# Patient Record
Sex: Male | Born: 1940 | Race: White | Hispanic: No | Marital: Married | Smoking: Former smoker
Health system: Southern US, Community
[De-identification: ages and names within clinical notes are randomized; demographics above are authoritative.]

## PROBLEM LIST (undated history)

## (undated) DIAGNOSIS — E785 Hyperlipidemia, unspecified: Secondary | ICD-10-CM

## (undated) DIAGNOSIS — C61 Malignant neoplasm of prostate: Secondary | ICD-10-CM

## (undated) DIAGNOSIS — M1990 Unspecified osteoarthritis, unspecified site: Secondary | ICD-10-CM

## (undated) DIAGNOSIS — C801 Malignant (primary) neoplasm, unspecified: Secondary | ICD-10-CM

## (undated) DIAGNOSIS — K449 Diaphragmatic hernia without obstruction or gangrene: Secondary | ICD-10-CM

## (undated) DIAGNOSIS — T7840XA Allergy, unspecified, initial encounter: Secondary | ICD-10-CM

## (undated) DIAGNOSIS — K5790 Diverticulosis of intestine, part unspecified, without perforation or abscess without bleeding: Secondary | ICD-10-CM

## (undated) DIAGNOSIS — K219 Gastro-esophageal reflux disease without esophagitis: Secondary | ICD-10-CM

## (undated) HISTORY — DX: Diverticulosis of intestine, part unspecified, without perforation or abscess without bleeding: K57.90

## (undated) HISTORY — PX: TRANSURETHRAL RESECTION OF PROSTATE: SHX73

## (undated) HISTORY — DX: Allergy, unspecified, initial encounter: T78.40XA

## (undated) HISTORY — DX: Gastro-esophageal reflux disease without esophagitis: K21.9

## (undated) HISTORY — DX: Malignant (primary) neoplasm, unspecified: C80.1

## (undated) HISTORY — PX: CATARACT EXTRACTION: SUR2

## (undated) HISTORY — DX: Hyperlipidemia, unspecified: E78.5

## (undated) HISTORY — PX: TONSILLECTOMY: SHX5217

## (undated) HISTORY — DX: Unspecified osteoarthritis, unspecified site: M19.90

## (undated) HISTORY — DX: Diaphragmatic hernia without obstruction or gangrene: K44.9

## (undated) HISTORY — PX: CHOLECYSTECTOMY: SHX55

## (undated) HISTORY — PX: BACK SURGERY: SHX140

## (undated) HISTORY — DX: Malignant neoplasm of prostate: C61

## (undated) HISTORY — PX: COLONOSCOPY: SHX174

## (undated) HISTORY — PX: UPPER GASTROINTESTINAL ENDOSCOPY: SHX188

---

## 1990-03-21 DIAGNOSIS — C61 Malignant neoplasm of prostate: Secondary | ICD-10-CM

## 1990-03-21 HISTORY — DX: Malignant neoplasm of prostate: C61

## 2005-05-18 ENCOUNTER — Encounter: Admission: RE | Admit: 2005-05-18 | Discharge: 2005-05-18 | Payer: Self-pay | Admitting: Family Medicine

## 2005-05-18 ENCOUNTER — Ambulatory Visit: Payer: Self-pay | Admitting: Family Medicine

## 2006-02-16 ENCOUNTER — Ambulatory Visit: Payer: Self-pay | Admitting: Family Medicine

## 2006-05-05 ENCOUNTER — Encounter: Payer: Self-pay | Admitting: Gastroenterology

## 2006-06-02 ENCOUNTER — Ambulatory Visit: Payer: Self-pay | Admitting: Family Medicine

## 2006-06-02 LAB — CONVERTED CEMR LAB
ALT: 24 units/L (ref 0–40)
AST: 22 units/L (ref 0–37)
Albumin: 4.1 g/dL (ref 3.5–5.2)
Alkaline Phosphatase: 68 units/L (ref 39–117)
BUN: 30 mg/dL — ABNORMAL HIGH (ref 6–23)
Basophils Absolute: 0 10*3/uL (ref 0.0–0.1)
Basophils Relative: 0.1 % (ref 0.0–1.0)
Bilirubin, Direct: 0.1 mg/dL (ref 0.0–0.3)
CO2: 29 meq/L (ref 19–32)
Calcium: 9 mg/dL (ref 8.4–10.5)
Chloride: 105 meq/L (ref 96–112)
Cholesterol: 224 mg/dL (ref 0–200)
Creatinine, Ser: 1.1 mg/dL (ref 0.4–1.5)
Direct LDL: 155.8 mg/dL
Eosinophils Absolute: 0.1 10*3/uL (ref 0.0–0.6)
Eosinophils Relative: 1.6 % (ref 0.0–5.0)
GFR calc Af Amer: 86 mL/min
GFR calc non Af Amer: 71 mL/min
Glucose, Bld: 88 mg/dL (ref 70–99)
HCT: 45.8 % (ref 39.0–52.0)
HDL: 46.8 mg/dL (ref >39.0)
Hemoglobin: 15.6 g/dL (ref 13.0–17.0)
Lymphocytes Relative: 26.9 % (ref 12.0–46.0)
MCHC: 34 g/dL (ref 30.0–36.0)
MCV: 92.1 fL (ref 78.0–100.0)
Monocytes Absolute: 0.4 10*3/uL (ref 0.2–0.7)
Monocytes Relative: 10.5 % (ref 3.0–11.0)
Neutro Abs: 2.5 10*3/uL (ref 1.4–7.7)
Neutrophils Relative %: 60.9 % (ref 43.0–77.0)
PSA: 0.03 ng/mL — ABNORMAL LOW (ref 0.10–4.00)
Platelets: 291 10*3/uL (ref 150–400)
Potassium: 4.3 meq/L (ref 3.5–5.1)
RBC: 4.97 M/uL (ref 4.22–5.81)
RDW: 13 % (ref 11.5–14.6)
Sodium: 144 meq/L (ref 135–145)
TSH: 5.68 microintl units/mL — ABNORMAL HIGH (ref 0.35–5.50)
Total Bilirubin: 1.2 mg/dL (ref 0.3–1.2)
Total CHOL/HDL Ratio: 4.8
Total Protein: 7.1 g/dL (ref 6.0–8.3)
Triglycerides: 89 mg/dL (ref 0–149)
VLDL: 18 mg/dL (ref 0–40)
WBC: 4.1 10*3/uL — ABNORMAL LOW (ref 4.5–10.5)

## 2006-06-22 ENCOUNTER — Ambulatory Visit: Payer: Self-pay | Admitting: Gastroenterology

## 2006-07-04 ENCOUNTER — Ambulatory Visit: Payer: Self-pay | Admitting: Gastroenterology

## 2006-11-24 DIAGNOSIS — Z8546 Personal history of malignant neoplasm of prostate: Secondary | ICD-10-CM

## 2007-04-19 ENCOUNTER — Telehealth: Payer: Self-pay | Admitting: Family Medicine

## 2007-08-20 ENCOUNTER — Encounter: Payer: Self-pay | Admitting: *Deleted

## 2007-08-22 ENCOUNTER — Telehealth: Payer: Self-pay | Admitting: Family Medicine

## 2007-10-02 ENCOUNTER — Telehealth: Payer: Self-pay | Admitting: *Deleted

## 2008-01-04 ENCOUNTER — Telehealth: Payer: Self-pay | Admitting: Family Medicine

## 2008-01-22 ENCOUNTER — Ambulatory Visit: Payer: Self-pay | Admitting: Family Medicine

## 2008-01-22 DIAGNOSIS — J309 Allergic rhinitis, unspecified: Secondary | ICD-10-CM

## 2008-01-22 LAB — CONVERTED CEMR LAB
ALT: 30 units/L (ref 0–53)
Basophils Relative: 0.2 % (ref 0.0–3.0)
Bilirubin Urine: NEGATIVE
Bilirubin, Direct: 0.1 mg/dL (ref 0.0–0.3)
Blood in Urine, dipstick: NEGATIVE
CO2: 28 meq/L (ref 19–32)
Calcium: 9.1 mg/dL (ref 8.4–10.5)
Creatinine, Ser: 1 mg/dL (ref 0.4–1.5)
Eosinophils Relative: 1.4 % (ref 0.0–5.0)
Glucose, Bld: 81 mg/dL (ref 70–99)
Glucose, Urine, Semiquant: NEGATIVE
Hemoglobin: 15.3 g/dL (ref 13.0–17.0)
Ketones, urine, test strip: NEGATIVE
Lymphocytes Relative: 24.3 % (ref 12.0–46.0)
Monocytes Relative: 6.1 % (ref 3.0–12.0)
Neutro Abs: 4.1 10*3/uL (ref 1.4–7.7)
Nitrite: NEGATIVE
PSA: 0.05 ng/mL — ABNORMAL LOW (ref 0.10–4.00)
Protein, U semiquant: NEGATIVE
RBC: 4.57 M/uL (ref 4.22–5.81)
Sodium: 141 meq/L (ref 135–145)
Specific Gravity, Urine: 1.02
Total CHOL/HDL Ratio: 5.4
Total Protein: 6.6 g/dL (ref 6.0–8.3)
Urobilinogen, UA: 0.2
VLDL: 64 mg/dL — ABNORMAL HIGH (ref 0–40)
WBC Urine, dipstick: NEGATIVE
pH: 5

## 2008-01-29 ENCOUNTER — Ambulatory Visit: Payer: Self-pay | Admitting: Family Medicine

## 2008-01-29 DIAGNOSIS — K219 Gastro-esophageal reflux disease without esophagitis: Secondary | ICD-10-CM | POA: Insufficient documentation

## 2008-01-29 DIAGNOSIS — E785 Hyperlipidemia, unspecified: Secondary | ICD-10-CM

## 2008-04-03 ENCOUNTER — Ambulatory Visit: Payer: Self-pay | Admitting: Family Medicine

## 2008-04-07 ENCOUNTER — Telehealth: Payer: Self-pay | Admitting: *Deleted

## 2009-04-01 ENCOUNTER — Ambulatory Visit: Payer: Self-pay | Admitting: Family Medicine

## 2009-04-01 LAB — CONVERTED CEMR LAB
ALT: 33 units/L (ref 0–53)
AST: 22 units/L (ref 0–37)
Alkaline Phosphatase: 75 units/L (ref 39–117)
Basophils Relative: 0.3 % (ref 0.0–3.0)
Bilirubin, Direct: 0 mg/dL (ref 0.0–0.3)
Calcium: 9.5 mg/dL (ref 8.4–10.5)
Chloride: 109 meq/L (ref 96–112)
Creatinine, Ser: 1.3 mg/dL (ref 0.4–1.5)
Eosinophils Relative: 4.5 % (ref 0.0–5.0)
GFR calc non Af Amer: 58.14 mL/min (ref >60)
LDL Cholesterol: 83 mg/dL (ref 0–99)
Lymphocytes Relative: 31.7 % (ref 12.0–46.0)
MCV: 95.9 fL (ref 78.0–100.0)
Monocytes Relative: 6.8 % (ref 3.0–12.0)
Neutrophils Relative %: 56.7 % (ref 43.0–77.0)
RBC: 4.68 M/uL (ref 4.22–5.81)
Total Bilirubin: 0.8 mg/dL (ref 0.3–1.2)
Total CHOL/HDL Ratio: 4
Triglycerides: 171 mg/dL — ABNORMAL HIGH (ref 0.0–149.0)
WBC: 5.4 10*3/uL (ref 4.5–10.5)

## 2009-04-02 ENCOUNTER — Encounter (INDEPENDENT_AMBULATORY_CARE_PROVIDER_SITE_OTHER): Payer: Self-pay | Admitting: *Deleted

## 2009-04-30 ENCOUNTER — Ambulatory Visit: Payer: Self-pay | Admitting: Gastroenterology

## 2009-04-30 DIAGNOSIS — R1310 Dysphagia, unspecified: Secondary | ICD-10-CM | POA: Insufficient documentation

## 2009-05-01 ENCOUNTER — Ambulatory Visit: Payer: Self-pay | Admitting: Gastroenterology

## 2009-05-06 ENCOUNTER — Encounter: Payer: Self-pay | Admitting: Gastroenterology

## 2009-05-13 ENCOUNTER — Ambulatory Visit: Payer: Self-pay | Admitting: Family Medicine

## 2009-05-28 ENCOUNTER — Ambulatory Visit: Payer: Self-pay | Admitting: Gastroenterology

## 2009-07-28 ENCOUNTER — Telehealth: Payer: Self-pay | Admitting: Family Medicine

## 2009-08-24 ENCOUNTER — Telehealth (INDEPENDENT_AMBULATORY_CARE_PROVIDER_SITE_OTHER): Payer: Self-pay | Admitting: *Deleted

## 2010-01-18 ENCOUNTER — Telehealth: Payer: Self-pay | Admitting: Family Medicine

## 2010-01-25 ENCOUNTER — Telehealth: Payer: Self-pay | Admitting: Family Medicine

## 2010-03-30 ENCOUNTER — Ambulatory Visit
Admission: RE | Admit: 2010-03-30 | Discharge: 2010-03-30 | Payer: Self-pay | Source: Home / Self Care | Attending: Family Medicine | Admitting: Family Medicine

## 2010-03-30 ENCOUNTER — Other Ambulatory Visit: Payer: Self-pay | Admitting: Family Medicine

## 2010-03-30 DIAGNOSIS — F528 Other sexual dysfunction not due to a substance or known physiological condition: Secondary | ICD-10-CM | POA: Insufficient documentation

## 2010-03-30 LAB — CBC WITH DIFFERENTIAL/PLATELET
Basophils Absolute: 0 10*3/uL (ref 0.0–0.1)
Basophils Relative: 0.5 % (ref 0.0–3.0)
Eosinophils Absolute: 0.1 10*3/uL (ref 0.0–0.7)
Eosinophils Relative: 1.7 % (ref 0.0–5.0)
HCT: 43.9 % (ref 39.0–52.0)
Hemoglobin: 15.3 g/dL (ref 13.0–17.0)
Lymphocytes Relative: 25.7 % (ref 12.0–46.0)
Lymphs Abs: 1.4 10*3/uL (ref 0.7–4.0)
MCHC: 34.9 g/dL (ref 30.0–36.0)
MCV: 95.2 fl (ref 78.0–100.0)
Monocytes Absolute: 0.4 10*3/uL (ref 0.1–1.0)
Monocytes Relative: 7.3 % (ref 3.0–12.0)
Neutro Abs: 3.5 10*3/uL (ref 1.4–7.7)
Neutrophils Relative %: 64.8 % (ref 43.0–77.0)
Platelets: 278 10*3/uL (ref 150.0–400.0)
RBC: 4.61 Mil/uL (ref 4.22–5.81)
RDW: 13.8 % (ref 11.5–14.6)
WBC: 5.4 10*3/uL (ref 4.5–10.5)

## 2010-03-30 LAB — HEPATIC FUNCTION PANEL
ALT: 37 U/L (ref 0–53)
AST: 23 U/L (ref 0–37)
Albumin: 4.3 g/dL (ref 3.5–5.2)
Alkaline Phosphatase: 76 U/L (ref 39–117)
Bilirubin, Direct: 0.1 mg/dL (ref 0.0–0.3)
Total Bilirubin: 0.7 mg/dL (ref 0.3–1.2)
Total Protein: 6.6 g/dL (ref 6.0–8.3)

## 2010-03-30 LAB — LIPID PANEL
Cholesterol: 180 mg/dL (ref 0–200)
HDL: 38.2 mg/dL — ABNORMAL LOW (ref >39.00)
Total CHOL/HDL Ratio: 5
Triglycerides: 392 mg/dL — ABNORMAL HIGH (ref 0.0–149.0)
VLDL: 78.4 mg/dL — ABNORMAL HIGH (ref 0.0–40.0)

## 2010-03-30 LAB — BASIC METABOLIC PANEL
BUN: 19 mg/dL (ref 6–23)
CO2: 28 mEq/L (ref 19–32)
Calcium: 9.4 mg/dL (ref 8.4–10.5)
Chloride: 106 mEq/L (ref 96–112)
Creatinine, Ser: 1 mg/dL (ref 0.4–1.5)
GFR: 75.84 mL/min (ref >60.00)
Glucose, Bld: 107 mg/dL — ABNORMAL HIGH (ref 70–99)
Potassium: 4.9 mEq/L (ref 3.5–5.1)
Sodium: 143 mEq/L (ref 135–145)

## 2010-03-30 LAB — PSA: PSA: 0.03 ng/mL — ABNORMAL LOW (ref 0.10–4.00)

## 2010-03-30 LAB — TSH: TSH: 5.61 u[IU]/mL — ABNORMAL HIGH (ref 0.35–5.50)

## 2010-03-30 LAB — LDL CHOLESTEROL, DIRECT: Direct LDL: 104.4 mg/dL

## 2010-04-13 ENCOUNTER — Telehealth: Payer: Self-pay | Admitting: Family Medicine

## 2010-04-22 NOTE — Assessment & Plan Note (Signed)
Summary: COMPLETE AND SIGN PRESCRIPTION FORM/CJR   Vital Signs:  Patient profile:   70 year old male BP sitting:   160 / 82  (left arm)  Vitals Entered By: Kern Reap CMA Duncan Dull) (March 30, 2010 9:29 AM)  Primary Care Provider:  Gaetano Net Ambri Miltner,MD   History of Present Illness: Travis Schwartz is a 70 year old male, nonsmoker, who comes in today for evaluation of possible hypertension and erectile dysfunction.  His blood pressure last year was 124/84, now it's 160/92, by Fleet Contras, 150 over hundred, by me right arm sitting position.  In about 1991 he had a total prostatectomy for prostate cancer by Dr. Wanda Plump.  He would like to discuss treatment options, including the vacuum device.  Referred to Dr. Brett Canales D. for further valuation.  Is also due for his annual physical examination  Allergies: No Known Drug Allergies  Past History:  Past medical, surgical, family and social histories (including risk factors) reviewed for relevance to current acute and chronic problems.  Past Medical History: Reviewed history from 04/28/2009 and no changes required. Arthritis High Cholesterol Prostate cancer, hx of Allergic rhinitis GERD Hyperlipidemia Diverticulosis  2008  Past Surgical History: Reviewed history from 04/30/2009 and no changes required. Transurethral resection of prostate Colonoscopy Tonsillectomy  Family History: Reviewed history from 04/30/2009 and no changes required. Family History High cholesterol Family History Uterine cancer: sister No FH of Colon Cancer:  Social History: Reviewed history from 04/30/2009 and no changes required. Retired Married Former Smoker quit 64 Alcohol use-no Illicit Drug Use - no Daily Caffeine Use coffee in the morning  Review of Systems      See HPI  Physical Exam  General:  Well-developed,well-nourished,in no acute distress; alert,appropriate and cooperative throughout examination   Problems:  Medical Problems Added: 1)   Dx of Elevated Blood Pressure Without Diagnosis of Hypertension  (ICD-796.2) 2)  Dx of Erectile Dysfunction  (ICD-302.72)  Impression & Recommendations:  Problem # 1:  ELEVATED BLOOD PRESSURE WITHOUT DIAGNOSIS OF HYPERTENSION (ICD-796.2) Assessment New  Orders: Venipuncture ([address]) TLB-Lipid Panel (80061-LIPID) TLB-BMP (Basic Metabolic Panel-BMET) (80048-METABOL) TLB-CBC Platelet - w/Differential (85025-CBCD) TLB-Hepatic/Liver Function Pnl (80076-HEPATIC) TLB-TSH (Thyroid Stimulating Hormone) (84443-TSH) TLB-PSA (Prostate Specific Antigen) (84153-PSA) Urinalysis-dipstick only (Medicare patient) (60454UJ) Specimen Handling (81191)  Problem # 2:  ERECTILE DYSFUNCTION (ICD-302.72) Assessment: Deteriorated  Orders: Venipuncture ([address]) TLB-Lipid Panel (80061-LIPID) TLB-BMP (Basic Metabolic Panel-BMET) (80048-METABOL) TLB-CBC Platelet - w/Differential (85025-CBCD) TLB-Hepatic/Liver Function Pnl (80076-HEPATIC) TLB-TSH (Thyroid Stimulating Hormone) (84443-TSH) TLB-PSA (Prostate Specific Antigen) (84153-PSA) Urinalysis-dipstick only (Medicare patient) (56213YQ)  Complete Medication List: 1)  Miralax Powd (Polyethylene glycol 3350) .... As needed 2)  Voltaren 75 Mg Tbec (Diclofenac sodium) .... Take 1 tablet by mouth twice a day 3)  Zocor 80 Mg Tabs (Simvastatin) .... Once daily 4)  Adult Aspirin Low Strength 81 Mg Tbdp (Aspirin) .... Once daily 5)  Omeprazole 40 Mg Cpdr (Omeprazole) .... Take one tab once daily 6)  Trazodone Hcl 50 Mg Tabs (Trazodone hcl) .... Take one tab by mouth at bedtime  Patient Instructions: 1)  setup and 30 minute appointment in two weeks for a general medical exam. 2)  Check your blood pressure daily in the morning and at bedtime with a digital pump up blood pressure cuff....OMRON.......  Seems to be the most accurate device 3)  Call the urology Center and make an appointment to see Dr. Viviann Spare D.alsredt for urologic evaluation   Orders Added: 1)   Venipuncture [36415] 2)  TLB-Lipid Panel [80061-LIPID] 3)  TLB-BMP (Basic Metabolic Panel-BMET) [80048-METABOL] 4)  TLB-CBC Platelet - w/Differential [85025-CBCD] 5)  TLB-Hepatic/Liver Function Pnl [80076-HEPATIC] 6)  TLB-TSH (Thyroid Stimulating Hormone) [84443-TSH] 7)  TLB-PSA (Prostate Specific Antigen) [84153-PSA] 8)  Est. Patient Level III [[address]] 9)  Urinalysis-dipstick only (Medicare patient) [81003QW] 10)  Specimen Handling [99000]  Appended Document: COMPLETE AND SIGN PRESCRIPTION FORM/CJR  Laboratory Results   Urine Tests    Routine Urinalysis   Color: yellow Appearance: Clear Glucose: negative   (Normal Range: Negative) Bilirubin: negative   (Normal Range: Negative) Ketone: negative   (Normal Range: Negative) Spec. Gravity: 1.020   (Normal Range: 1.003-1.035) Blood: negative   (Normal Range: Negative) pH: 5.0   (Normal Range: 5.0-8.0) Protein: negative   (Normal Range: Negative) Urobilinogen: 0.2   (Normal Range: 0-1) Nitrite: negative   (Normal Range: Negative) Leukocyte Esterace: negative   (Normal Range: Negative)    Comments: Rita Ohara  March 30, 2010 4:21 PM

## 2010-04-22 NOTE — Progress Notes (Signed)
Summary: Refill  Phone Note Refill Request   Refills Requested: Medication #1:  OMEPRAZOLE 40 MG CPDR take one tab once daily   Dosage confirmed as above?Dosage Confirmed Initial call taken by: Kathrynn Speed CMA,  August 24, 2009 12:58 PM Caller: [facility] [address] [phone]* Call For: Aleatha Borer  Initial call taken by: Kathrynn Speed CMA,  August 24, 2009 12:58 PM    Prescriptions: OMEPRAZOLE 40 MG CPDR (OMEPRAZOLE) take one tab once daily  #100 x 3   Entered by:   Kathrynn Speed CMA   Authorized by:   Roderick Pee MD   Signed by:   Kathrynn Speed CMA on 08/24/2009   Method used:   Electronically to        [facility] [address] (retail)       [address]       [place], [place]  [address]       Ph: [phone] or [phone]       Fax: [phone]   RxID:   [id number]

## 2010-04-22 NOTE — Letter (Signed)
Summary: Results Letter  [facility]  [address] [place], [place] [address]   Phone: [phone]  Fax: [phone]        April 30, 2009 MRN: [id number]    Travis Schwartz [address] [place], [place]  [address]    Dear Mr. Frees,  It is my pleasure to have treated you recently as a new patient in my office. I appreciate your confidence and the opportunity to participate in your care.  Since I do have a busy inpatient endoscopy schedule and office schedule, my office hours vary weekly. I am, however, available for emergency calls everyday through my office. If I am not available for an urgent office appointment, another one of our gastroenterologist will be able to assist you.  My well-trained staff are prepared to help you at all times. For emergencies after office hours, a physician from our Gastroenterology section is always available through my 24 hour answering service  Once again I welcome you as a new patient and I look forward to a happy and healthy relationship             Sincerely,  Louis Meckel MD  This letter has been electronically signed by your physician.  Appended Document: Results Letter letter mailed

## 2010-04-22 NOTE — Procedures (Signed)
Summary: COLONOSCOPY   Colonoscopy  Procedure date:  07/04/2006  Findings:      Results: Diverticulosis.       Location:  [facility].    Comments:      Repeat colonoscopy in 5 years.   Colonoscopy  Procedure date:  07/04/2006  Findings:      Results: Diverticulosis.       Location:  [facility].    Comments:      Repeat colonoscopy in 5 years.  Patient Name: Travis Schwartz, Travis Schwartz MRN:  Procedure Procedures: Colonoscopy CPT: [phone].  Personnel: Endoscopist: Barbette Hair. Arlyce Dice, MD.  Referred By: Eugenio Hoes Tawanna Cooler, MD.  Patient Consent: Procedure, Alternatives, Risks and Benefits discussed, consent obtained, from patient.  Indications  Increased Risk Screening: For family history of colorectal neoplasia, in  parent age at onset: 3.  History  Current Medications: Patient is not currently taking Coumadin.  Pre-Exam Physical: Performed Jul 04, 2006. Cardio-pulmonary exam, HEENT exam , Abdominal exam, Mental status exam WNL.  Comments: Patient history reviewed/updated, physical performed prior to initiation of sedation?yes Exam Exam: Extent of exam reached: Cecum, extent intended: Cecum.  The cecum was identified by appendiceal orifice and IC valve. Time to Cecum: 00:03: 57. Time for Withdrawl: 00:05:35. Colon retroflexion performed. ASA Classification: I. Tolerance: good.  Monitoring: Pulse and BP monitoring, Oximetry used. Supplemental O2 given. at 2 Liters.  Colon Prep Used Miralax for colon prep. Prep results: good.  Sedation Meds: Patient assessed and found to be appropriate for moderate (conscious) sedation. Sedation was managed by the Endoscopist. Fentanyl 100 mcg. given IV. Versed 8 mg. given IV.  Findings - NORMAL EXAM: Cecum to Descending Colon.  NORMAL EXAM: Cecum.  - DIVERTICULOSIS: Sigmoid Colon. ICD9: Diverticulosis, Colon: 562.10. Comments: Few diverticula.  - NORMAL EXAM: Sigmoid Colon to Rectum.    Assessment Abnormal examination, see findings above.  Diagnoses: 562.10: Diverticulosis, Colon.   Events  Unplanned Interventions: No intervention was required.  Unplanned Events: There were no complications. Plans  Post Exam Instructions: Post sedation instructions given.  Patient Education: Patient given standard instructions for: Diverticulosis.  Disposition: After procedure patient sent to recovery. After recovery patient sent home.  Scheduling/Referral: Colonoscopy, to Barbette Hair. Arlyce Dice, MD, around Jul 04, 2011.    This report was created from the original endoscopy report, which was reviewed and signed by the above listed endoscopist.

## 2010-04-22 NOTE — Assessment & Plan Note (Signed)
Summary: DIFF W FOOD GETTING STUCK/MEDICARE AND SUPP/SHELL    History of Present Illness Visit Type: Follow-up Consult Primary GI MD: Melvia Heaps MD [facility] Primary Provider: Gaetano Net Todd,MD Requesting Provider: Gaetano Net Todd,MD Chief Complaint: Patient states that he is waking up in the middle of the night with severe epigastric pain and burning. Since he has increased his Omperazole to twice a day he has not has a problem with the burning at night.  He denies any dysphagia.  History of Present Illness:   [name] is a pleasant 70 year old white male referred at the request of Dr. Tawanna Cooler for evaluation of chest burning and upper abdominal discomfort.  He often awakens because of pyrosis.  He may have upper abdominal discomfort as well.  Since taking Prilosec twice a day symptoms have improved though they remain.  He has dysphagia to solids.  He is taking voltarem  daily.  The patient has history of diverticulosis as determined by colonoscopy in 2008.  Has a history of prostate cancer.   GI Review of Systems    Reports abdominal pain and  chest pain.     Location of  Abdominal pain: epigastric area.    Denies acid reflux, belching, bloating, dysphagia with liquids, dysphagia with solids, heartburn, loss of appetite, nausea, vomiting, vomiting blood, weight loss, and  weight gain.        Denies anal fissure, black tarry stools, change in bowel habit, constipation, diarrhea, diverticulosis, fecal incontinence, heme positive stool, hemorrhoids, irritable bowel syndrome, jaundice, light color stool, liver problems, rectal bleeding, and  rectal pain. Preventive Screening-Counseling & Management      Drug Use:  no.      Current Medications (verified): 1)  Miralax  Powd (Polyethylene Glycol 3350) .... As Needed 2)  Voltaren 75 Mg Tbec (Diclofenac Sodium) .... Take 1 Tablet By Mouth Twice A Day 3)  Zocor 80 Mg Tabs (Simvastatin) .... Once Daily 4)  Adult Aspirin Low Strength 81 Mg   Tbdp (Aspirin) .... Once Daily 5)  Omeprazole 40 Mg Cpdr (Omeprazole) .... Take One By Mouth Two Times A Day  Allergies (verified): No Known Drug Allergies  Past History:  Past Medical History: Last updated: 04/28/2009 Arthritis High Cholesterol Prostate cancer, hx of Allergic rhinitis GERD Hyperlipidemia Diverticulosis  2008  Past Surgical History: Transurethral resection of prostate Colonoscopy Tonsillectomy  Family History: Family History High cholesterol Family History Uterine cancer: sister No FH of Colon Cancer:  Social History: Retired Married Former Smoker quit 64 Alcohol use-no Illicit Drug Use - no Daily Caffeine Use coffee in the morning Drug Use:  no  Review of Systems       The patient complains of back pain.  The patient denies allergy/sinus, anemia, anxiety-new, arthritis/joint pain, blood in urine, breast changes/lumps, change in vision, confusion, cough, coughing up blood, depression-new, fainting, fatigue, fever, headaches-new, hearing problems, heart murmur, heart rhythm changes, itching, menstrual pain, muscle pains/cramps, night sweats, nosebleeds, pregnancy symptoms, shortness of breath, skin rash, sleeping problems, sore throat, swelling of feet/legs, swollen lymph glands, thirst - excessive , urination - excessive , urination changes/pain, urine leakage, vision changes, and voice change.         All other systems were reviewed and were negative   Vital Signs:  Patient profile:   70 year old male Height:      69 inches Weight:      221 pounds BMI:     32.75 Pulse rate:   84 / minute Pulse rhythm:  regular BP sitting:   130 / 76  (left arm) Cuff size:   regular  Vitals Entered By: Harlow Mares CMA (AAMA) (April 30, 2009 10:14 AM)  Physical Exam  Additional Exam:  Is a healthy-appearing male  skin: anicteric HEENT: normocephalic; PEERLA; no nasal or pharyngeal abnormalities neck: supple nodes: no cervical  lymphadenopathy chest: clear to ausculatation and percussion heart: no murmurs, gallops, or rubs abd: soft, nontender; BS normoactive; no abdominal masses, tenderness, organomegaly rectal: deferred ext: no cynanosis, clubbing, edema skeletal: no deformities neuro: oriented x 3; no focal abnormalities    Impression & Recommendations:  Problem # 1:  GERD (ICD-530.81) The patient has nocturnal GERD.  Symptoms may be exacerbated by his NSAID use  Recommendations #1 trial of Zegerid 40 mg q.h.s. #2 upper endoscopy #3 patient may have to hold his NSAIDs if symptoms continue  Problem # 2:  DYSPHAGIA UNSPECIFIED (ICD-787.20)  rule out early esophageal stricture  Recommendations #1 upper endoscopy with dilatation as indicated  Risks, alternatives, and complications of the procedure, including bleeding, perforation, and possible need for surgery, were explained to the patient.  Patient's questions were answered.  Orders: EGD (EGD)  Problem # 3:  PROSTATE CANCER, HX OF (ICD-V10.46) Assessment: Comment Only  Patient Instructions: 1)  Conscious Sedation brochure given.  2)  Upper Endoscopy with Dilatation brochure given.  3)  Your EGD is scheduled for 05/01/2009 at 2:30pm 4)  You can pick up your new rx from your pharmacy today 5)  The medication list was reviewed and reconciled.  All changed / newly prescribed medications were explained.  A complete medication list was provided to the patient / caregiver. Prescriptions: ZEGERID 40-1100 MG CAPS (OMEPRAZOLE-SODIUM BICARBONATE) take one tab q.h.s.  #30 x 2   Entered and Authorized by:   Louis Meckel MD   Signed by:   Louis Meckel MD on 04/30/2009   Method used:   Electronically to        [facility] (retail)       [address]       [place], [place]  [address]       Ph: [phone] or [phone]       Fax: [phone]   RxID:   [id number]

## 2010-04-22 NOTE — Letter (Signed)
Summary: [facility] Referral (unable to schedule) Notification  [facility]  [address] [place], [place] [address]   Phone: [phone]  Fax: [phone]      April 02, 2009 Travis Schwartz 03-26-1940 MRN: [id number]   [name] [address] [place], [place]  [address]   Dear Dr. Tawanna Cooler:   Thank you for your kind referral of the above patient. We have attempted to schedule the recommended GI Consult but have been unable to schedule because:  __ The patient was not available by phone and/or has not returned our calls.  _x_ The patient declined to schedule the procedure at this time.  We appreciate the referral and hope that we will have the opportunity to treat this patient in the future.    Sincerely,   [facility]  Vania Rea. Jarold Motto M.D. Hedwig Morton. Juanda Chance M.D. Venita Lick. Russella Dar M.D. Wilhemina Bonito. Marina Goodell M.D. Barbette Hair. Arlyce Dice M.D. Iva Boop M.D. Cheron Every.D.

## 2010-04-22 NOTE — Assessment & Plan Note (Signed)
Summary: MED CK (REFILLS) // RS   Vital Signs:  Patient profile:   70 year old male Weight:      221 pounds Temp:     98.8 degrees F Pulse rate:   82 / minute Resp:     12 per minute BP sitting:   118 / 74  (left arm)  Vitals Entered By: Gladis Riffle, RN (April 01, 2009 10:16 AM)   History of Present Illness: Travis Schwartz is a 70 year old, married male, nonsmoker, who comes in after  a year's absence for evaluation of dysphagia.  He set a long-standing history of reflux esophagitis.  He takes omeprazole 40 mg q.a.m. and tries not to eat before he lies down.  Over the past month or two.  He said his food getting stuck in his midesophagus.  He said no chest pain.  Is also due for his annual exam.  He stopped the Synthroid 3 months ago because it didn't feel like he was doing him any good.  Preventive Screening-Counseling & Management  Alcohol-Tobacco     Smoking Status: quit     Year Started: 1962     Year Quit: 1965  Allergies (verified): No Known Drug Allergies  Comments:  Nurse/Medical Assistant: med check, needs refills, fasting--states does not take synthroid and rarely uses darvocet  The patient's medications and allergies were reviewed with the patient and were updated in the Medication and Allergy Lists. Gladis Riffle, RN (April 01, 2009 10:20 AM)  Past History:  Past medical, surgical, family and social histories (including risk factors) reviewed for relevance to current acute and chronic problems.  Past Medical History: Reviewed history from 01/29/2008 and no changes required. Arthritis High Cholesterol Prostate cancer, hx of Allergic rhinitis GERD Hyperlipidemia  Past Surgical History: Reviewed history from 11/24/2006 and no changes required. Transurethral resection of prostate Colonoscopy  Family History: Reviewed history from 11/24/2006 and no changes required. Family History High cholesterol Family History Uterine cancer  Social History: Reviewed  history from 11/24/2006 and no changes required. Retired Married Former Smoker Alcohol use-no  Review of Systems      See HPI  Physical Exam  General:  Well-developed,well-nourished,in no acute distress; alert,appropriate and cooperative throughout examination   Impression & Recommendations:  Problem # 1:  GERD (ICD-530.81) Assessment Deteriorated  His updated medication list for this problem includes:    Omeprazole 40 Mg Cpdr (Omeprazole) .Marland Kitchen... Take 1 tablet by mouth every morning  Orders: Gastroenterology Referral (GI)  Complete Medication List: 1)  Miralax Powd (Polyethylene glycol 3350) .... As needed 2)  Voltaren 75 Mg Tbec (Diclofenac sodium) .... Take 1 tablet by mouth twice a day 3)  Zocor 80 Mg Tabs (Simvastatin) .... Once daily 4)  Adult Aspirin Low Strength 81 Mg Tbdp (Aspirin) .... Once daily 5)  Omeprazole 40 Mg Cpdr (Omeprazole) .... Take 1 tablet by mouth every morning 6)  Darvocet A500 100-500 Mg Tabs (Propoxyphene n-apap) .Marland Kitchen.. 1 or 2 at bedtime as needed pain 7)  Synthroid 50 Mcg Tabs (Levothyroxine sodium) .... Take 1 tablet by mouth every morning  Other Orders: Venipuncture ([address]) TLB-Lipid Panel (80061-LIPID) TLB-BMP (Basic Metabolic Panel-BMET) (80048-METABOL) TLB-CBC Platelet - w/Differential (85025-CBCD) TLB-TSH (Thyroid Stimulating Hormone) (84443-TSH) TLB-Hepatic/Liver Function Pnl (80076-HEPATIC) TLB-PSA (Prostate Specific Antigen) (84153-PSA)  Patient Instructions: 1)  we will put in a request for you to see your gastroenterologist, ASAP. 2)  In the meantime, nothing to eat or drink for 3 hours prior to bedtime elevate her head with two pillows and double  the Prilosec.  Also avoid eating, anything it's difficult to swallow, like steak and hot dogs.  When you do eat chew slowly and drink lots of water. 3)  Set up a 30 minute appointment sometime in the next 6 weeks for your annual physical exam Prescriptions: OMEPRAZOLE 40 MG CPDR  (OMEPRAZOLE) Take 1 tablet by mouth every morning  #100 Capsule x 3   Entered and Authorized by:   Roderick Pee MD   Signed by:   Roderick Pee MD on 04/01/2009   Method used:   Print then Give to Patient   RxID:   [id number] ZOCOR 80 MG TABS (SIMVASTATIN) once daily  #100 Tablet x 3   Entered and Authorized by:   Roderick Pee MD   Signed by:   Roderick Pee MD on 04/01/2009   Method used:   Print then Give to Patient   RxID:   [id number] VOLTAREN 75 MG TBEC (DICLOFENAC SODIUM) Take 1 tablet by mouth twice a day  #200 x 3   Entered and Authorized by:   Roderick Pee MD   Signed by:   Roderick Pee MD on 04/01/2009   Method used:   Print then Give to Patient   RxID:   [id number]   Appended Document: MED CK (REFILLS) // RS  Laboratory Results   Urine Tests    Routine Urinalysis   Color: yellow Appearance: Clear Glucose: negative   (Normal Range: Negative) Bilirubin: 1+   (Normal Range: Negative) Ketone: 1+   (Normal Range: Negative) Spec. Gravity: >=1.030   (Normal Range: 1.003-1.035) Blood: negative   (Normal Range: Negative) pH: 5.5   (Normal Range: 5.0-8.0) Protein: 1+   (Normal Range: Negative) Urobilinogen: 0.2   (Normal Range: 0-1) Nitrite: negative   (Normal Range: Negative) Leukocyte Esterace: negative   (Normal Range: Negative)    Comments: Rita Ohara  May 13, 2009 9:47 AM

## 2010-04-22 NOTE — Progress Notes (Signed)
Summary: trazadone refill  Phone Note From Pharmacy   Summary of Call: patient is requesting a refill of trazadone is this okay to fill? Initial call taken by: Kern Reap CMA Duncan Dull),  Jul 28, 2009 9:28 AM  Follow-up for Phone Call         okay for one year Follow-up by: Roderick Pee MD,  Jul 28, 2009 10:04 AM  Additional Follow-up for Phone Call Additional follow up Details #1::        Phone call completed Additional Follow-up by: Kern Reap CMA Duncan Dull),  Jul 28, 2009 10:25 AM

## 2010-04-22 NOTE — Letter (Signed)
Summary: Results Letter  [facility]  [address] [place], [place] [address]   Phone: [phone]  Fax: [phone]        May 06, 2009 MRN: [id number]    Travis Schwartz [address] [place], [place]  [address]    Dear Mr. Bufford,   Your biopsies demonstrated inflammatory changes only.    Please follow the recommendations previously discussed.  Should you have any immediate concerns or questions, feel free to contact me at the office.    Sincerely,  Barbette Hair. Arlyce Dice, M.D., [facility]          Sincerely,  Louis Meckel MD  This letter has been electronically signed by your physician.  Appended Document: Results Letter Letter mailed 2.17.11

## 2010-04-22 NOTE — Progress Notes (Signed)
Summary: refill request  Phone Note Refill Request Message from:  Fax from Pharmacy on April 13, 2010 2:09 PM  Refills Requested: Medication #1:  ZOCOR 80 MG TABS once daily Initial call taken by: Kern Reap CMA Duncan Dull),  April 13, 2010 2:09 PM    Prescriptions: ZOCOR 80 MG TABS (SIMVASTATIN) once daily  #100 Tablet x 3   Entered by:   Kern Reap CMA (AAMA)   Authorized by:   Roderick Pee MD   Signed by:   Kern Reap CMA (AAMA) on 04/13/2010   Method used:   Electronically to        [facility] [address] (retail)       [address]       [place], [place]  [address]       Ph: [phone] or [phone]       Fax: [phone]   RxID:   [id number]

## 2010-04-22 NOTE — Letter (Signed)
Summary: EGD Instructions  [facility]  [address] [place], [place] [address]   Phone: [phone]  Fax: [phone]       Travis Schwartz    02/24/1941    MRN: [id number]       Procedure Day /Date:FRIDAY 05/01/2009     Arrival Time: 1:30PM     Procedure Time:2:30PM     Location of Procedure:                    X  [facility] (4th Floor)   PREPARATION FOR ENDOSCOPY/DIL   On 05/01/2009 THE DAY OF THE PROCEDURE:  1.   No solid foods, milk or milk products are allowed after midnight the night before your procedure.  2.   Do not drink anything colored red or purple.  Avoid juices with pulp.  No orange juice.  3.  You may drink clear liquids until 12:30PM  which is 2 hours before your procedure.                                                                                                CLEAR LIQUIDS INCLUDE: Water Jello Ice Popsicles Tea (sugar ok, no milk/cream) Powdered fruit flavored drinks Coffee (sugar ok, no milk/cream) Gatorade Juice: apple, white grape, white cranberry  Lemonade Clear bullion, consomm, broth Carbonated beverages (any kind) Strained chicken noodle soup Hard Candy   MEDICATION INSTRUCTIONS  Unless otherwise instructed, you should take regular prescription medications with a small sip of water as early as possible the morning of your procedure.          OTHER INSTRUCTIONS  You will need a responsible adult at least 71 years of age to accompany you and drive you home.   This person must remain in the waiting room during your procedure.  Wear loose fitting clothing that is easily removed.  Leave jewelry and other valuables at home.  However, you may wish to bring a book to read or an iPod/MP3 player to listen to music as you wait for your procedure to start.  Remove all body piercing jewelry and leave at home.  Total time from sign-in until discharge is approximately 2-3 hours.  You should go home directly  after your procedure and rest.  You can resume normal activities the day after your procedure.  The day of your procedure you should not:   Drive   Make legal decisions   Operate machinery   Drink alcohol   Return to work  You will receive specific instructions about eating, activities and medications before you leave.    The above instructions have been reviewed and explained to me by   _______________________    I fully understand and can verbalize these instructions _____________________________ Date _________

## 2010-04-22 NOTE — Assessment & Plan Note (Signed)
Summary: 30 MIN OV/NJR   Vital Signs:  Patient profile:   70 year old male Height:      69 inches Weight:      220 pounds Temp:     99.1 degrees F oral BP sitting:   124 / 84  (left arm) Cuff size:   regular  Vitals Entered By: Kern Reap CMA Duncan Dull) (May 13, 2009 8:41 AM)  Reason for Visit cpx  Primary Care Provider:  Gaetano Net Todd,MD   History of Present Illness: Travis Schwartz is a 70 year old, married male, nonsmoker, who comes in today for multiple issues.  Is a history of osteoarthritis for which he takes Voltaren 75 mg b.i.d.  He has a history of underlying hyperlipidemia, for which he takes Zocor 80 mg nightly will check lipid panel today.  He has a history of dysphasia.  GI evaluation by Dr. Arlyce Dice, negative.  He was placed on zergrid...Marland KitchenMarland KitchenMarland Kitchen but would like to go back on generic Prilosec because of cost issues.  I asked him to discuss this with his gastroenterologist.  He gets routine eye care to care.  Colonoscopy done in GI.  Prostatectomy years ago for prostate cancer, asymptomatic he's been released by the urologist.  Tetanus 2007, seasonal flu 2010, Pneumovax 2007, shingles 2009.  Retired in December 2011  Allergies: No Known Drug Allergies  Past History:  Past medical, surgical, family and social histories (including risk factors) reviewed, and no changes noted (except as noted below).  Past Medical History: Reviewed history from 04/28/2009 and no changes required. Arthritis High Cholesterol Prostate cancer, hx of Allergic rhinitis GERD Hyperlipidemia Diverticulosis  2008  Past Surgical History: Reviewed history from 04/30/2009 and no changes required. Transurethral resection of prostate Colonoscopy Tonsillectomy  Family History: Reviewed history from 04/30/2009 and no changes required. Family History High cholesterol Family History Uterine cancer: sister No FH of Colon Cancer:  Social History: Reviewed history from 04/30/2009 and no  changes required. Retired Married Former Smoker quit 64 Alcohol use-no Illicit Drug Use - no Daily Caffeine Use coffee in the morning  Review of Systems      See HPI  Physical Exam  General:  Well-developed,well-nourished,in no acute distress; alert,appropriate and cooperative throughout examination Head:  Normocephalic and atraumatic without obvious abnormalities. No apparent alopecia or balding. Eyes:  No corneal or conjunctival inflammation noted. EOMI. Perrla. Funduscopic exam benign, without hemorrhages, exudates or papilledema. Vision grossly normal. Ears:  External ear exam shows no significant lesions or deformities.  Otoscopic examination reveals clear canals, tympanic membranes are intact bilaterally without bulging, retraction, inflammation or discharge. Hearing is grossly normal bilaterally. Nose:  External nasal examination shows no deformity or inflammation. Nasal mucosa are pink and moist without lesions or exudates. Mouth:  Oral mucosa and oropharynx without lesions or exudates.  Teeth in good repair. Neck:  No deformities, masses, or tenderness noted. Chest Wall:  No deformities, masses, tenderness or gynecomastia noted. Breasts:  No masses or gynecomastia noted Lungs:  Normal respiratory effort, chest expands symmetrically. Lungs are clear to auscultation, no crackles or wheezes. Heart:  Normal rate and regular rhythm. S1 and S2 normal without gallop, murmur, click, rub or other extra sounds. Abdomen:  Bowel sounds positive,abdomen soft and non-tender without masses, organomegaly or hernias noted. Rectal:  No external abnormalities noted. Normal sphincter tone. No rectal masses or tenderness. Genitalia:  Testes bilaterally descended without nodularity, tenderness or masses. No scrotal masses or lesions. No penis lesions or urethral discharge. Prostate:  surgically removed Msk:  No deformity  or scoliosis noted of thoracic or lumbar spine.   Pulses:  R and L  carotid,radial,femoral,dorsalis pedis and posterior tibial pulses are full and equal bilaterally Extremities:  No clubbing, cyanosis, edema, or deformity noted with normal full range of motion of all joints.   Neurologic:  No cranial nerve deficits noted. Station and gait are normal. Plantar reflexes are down-going bilaterally. DTRs are symmetrical throughout. Sensory, motor and coordinative functions appear intact. Skin:  Intact without suspicious lesions or rashes Cervical Nodes:  No lymphadenopathy noted Axillary Nodes:  No palpable lymphadenopathy Inguinal Nodes:  No significant adenopathy Psych:  Cognition and judgment appear intact. Alert and cooperative with normal attention span and concentration. No apparent delusions, illusions, hallucinations   Impression & Recommendations:  Problem # 1:  HYPERLIPIDEMIA (ICD-272.4) Assessment Improved  His updated medication list for this problem includes:    Zocor 80 Mg Tabs (Simvastatin) ..... Once daily  Orders: Venipuncture ([address]) Prescription Created Electronically [phone]) UA Dipstick w/o Micro (automated)  ([address]1003)  Problem # 2:  ALLERGIC RHINITIS (ICD-477.9) Assessment: Improved  Orders: Venipuncture ([address]) Prescription Created Electronically [phone]) UA Dipstick w/o Micro (automated)  ([address])  Problem # 3:  PROSTATE CANCER, HX OF (ICD-V10.46) Assessment: Improved  Orders: Venipuncture ([address]) Prescription Created Electronically [phone]) UA Dipstick w/o Micro (automated)  ([address])  Problem # 4:  GERD (ICD-530.81) Assessment: Improved  His updated medication list for this problem includes:    Zegerid 40-1100 Mg Caps (Omeprazole-sodium bicarbonate) .Marland Kitchen... Take one tab q.h.s.    Omeprazole 40 Mg Cpdr (Omeprazole) .Marland Kitchen... Take one tab once daily  Orders: Venipuncture ([address]) Prescription Created Electronically [phone]) UA Dipstick w/o Micro (automated)  ([address]1003)  Complete Medication List: 1)  Miralax Powd (Polyethylene  glycol 3350) .... As needed 2)  Voltaren 75 Mg Tbec (Diclofenac sodium) .... Take 1 tablet by mouth twice a day 3)  Zocor 80 Mg Tabs (Simvastatin) .... Once daily 4)  Adult Aspirin Low Strength 81 Mg Tbdp (Aspirin) .... Once daily 5)  Zegerid 40-1100 Mg Caps (Omeprazole-sodium bicarbonate) .... Take one tab q.h.s. 6)  Omeprazole 40 Mg Cpdr (Omeprazole) .... Take one tab once daily  Patient Instructions: 1)  Please schedule a follow-up appointment in 1 year. 2)  It is important that you exercise regularly at least 20 minutes 5 times a week. If you develop chest pain, have severe difficulty breathing, or feel very tired , stop exercising immediately and seek medical attention. 3)  Take an Aspirin every day. Prescriptions: OMEPRAZOLE 40 MG CPDR (OMEPRAZOLE) take one tab once daily  #100 x 3   Entered and Authorized by:   Roderick Pee MD   Signed by:   Roderick Pee MD on 05/13/2009   Method used:   Electronically to        [facility] [address] (retail)       [address]       [place], [place]  [address]       Ph: [phone] or [phone]       Fax: [phone]   RxID:   [id number] ZOCOR 80 MG TABS (SIMVASTATIN) once daily  #100 Tablet x 3   Entered and Authorized by:   Roderick Pee MD   Signed by:   Roderick Pee MD on 05/13/2009   Method used:   Electronically to        [facility] [address] [phone]* (retail)  [address]       [place], [place]  [address]       Ph: [phone] or [phone]       Fax: [phone]   RxID:   [id number] VOLTAREN 75 MG TBEC (DICLOFENAC SODIUM) Take 1 tablet by mouth twice a day  #200 x 3   Entered and Authorized by:   Roderick Pee MD   Signed by:   Roderick Pee MD on 05/13/2009   Method used:   Electronically to        [facility] [address] (retail)       [address]       [place], [place]  [address]       Ph: [phone] or [phone]        Fax: [phone]   RxID:   [id number]    Immunization History:  Influenza Immunization History:    Influenza:  historical (12/19/2008)    Appended Document: 30 MIN OV/NJR     Allergies: No Known Drug Allergies   Complete Medication List: 1)  Miralax Powd (Polyethylene glycol 3350) .... As needed 2)  Voltaren 75 Mg Tbec (Diclofenac sodium) .... Take 1 tablet by mouth twice a day 3)  Zocor 80 Mg Tabs (Simvastatin) .... Once daily 4)  Adult Aspirin Low Strength 81 Mg Tbdp (Aspirin) .... Once daily 5)  Zegerid 40-1100 Mg Caps (Omeprazole-sodium bicarbonate) .... Take one tab q.h.s. 6)  Omeprazole 40 Mg Cpdr (Omeprazole) .... Take one tab once daily  Other Orders: EKG w/ Interpretation ([address])

## 2010-04-22 NOTE — Progress Notes (Signed)
Summary: zocor refill  Phone Note Refill Request Message from:  Fax from Pharmacy on January 18, 2010 12:26 PM  Refills Requested: Medication #1:  ZOCOR 80 MG TABS once daily Initial call taken by: Kern Reap CMA Duncan Dull),  January 18, 2010 12:26 PM    Prescriptions: ZOCOR 80 MG TABS (SIMVASTATIN) once daily  #100 Tablet x 1   Entered by:   Kern Reap CMA (AAMA)   Authorized by:   Roderick Pee MD   Signed by:   Kern Reap CMA (AAMA) on 01/18/2010   Method used:   Electronically to        [facility] [address] (retail)       [address]       [place], [place]  [address]       Ph: [phone] or [phone]       Fax: [phone]   RxID:   [id number]

## 2010-04-22 NOTE — Progress Notes (Signed)
Summary: [facility] called re: status of req for ED Pump  Phone Note From Other Clinic Call back at [phone] x [phone] [facility]   Caller: [facility]    - Maralyn Sago Summary of Call: [facility] called to check on status of req for ED Pump. Pls call asap.    Faxing over another req. Pls complete and return asap.     Initial call taken by: Lucy Antigua,  January 25, 2010 10:57 AM  Follow-up for Phone Call        dr todd did not order this.  patient will have to go to urology Follow-up by: Kern Reap CMA Duncan Dull),  January 25, 2010 11:35 AM

## 2010-04-22 NOTE — Assessment & Plan Note (Signed)
Summary: f.u after procedure ...em    History of Present Illness Visit Type: Follow-up Visit Primary GI MD: Travis Heaps MD [facility] Primary Provider: Gaetano Net Todd,MD Requesting Provider: Gaetano Net Todd,MD Chief Complaint: F/U Endo, no problems reported History of Present Illness:   Travis Schwartz has returned following upper endoscopy a proximal month ago.  This demonstrated inflammatory changes at the GE junction.  No stricture was seen.  On omeprazole his symptoms have entirely subsided.  He is without pain, pyrosis or dysphagia.  He continues to take voltarem  once a day for his arthritis.   GI Review of Systems      Denies abdominal pain, acid reflux, belching, bloating, chest pain, dysphagia with liquids, dysphagia with solids, heartburn, loss of appetite, nausea, vomiting, vomiting blood, weight loss, and  weight gain.        Denies anal fissure, black tarry stools, change in bowel habit, constipation, diarrhea, diverticulosis, fecal incontinence, heme positive stool, hemorrhoids, irritable bowel syndrome, jaundice, light color stool, liver problems, rectal bleeding, and  rectal pain.    Current Medications (verified): 1)  Miralax  Powd (Polyethylene Glycol 3350) .... As Needed 2)  Voltaren 75 Mg Tbec (Diclofenac Sodium) .... Take 1 Tablet By Mouth Twice A Day 3)  Zocor 80 Mg Tabs (Simvastatin) .... Once Daily 4)  Adult Aspirin Low Strength 81 Mg  Tbdp (Aspirin) .... Once Daily 5)  Omeprazole 40 Mg Cpdr (Omeprazole) .... Take One Tab Once Daily  Allergies (verified): No Known Drug Allergies  Past History:  Past Medical History: Reviewed history from 04/28/2009 and no changes required. Arthritis High Cholesterol Prostate cancer, hx of Allergic rhinitis GERD Hyperlipidemia Diverticulosis  2008  Past Surgical History: Reviewed history from 04/30/2009 and no changes required. Transurethral resection of prostate Colonoscopy Tonsillectomy  Family  History: Reviewed history from 04/30/2009 and no changes required. Family History High cholesterol Family History Uterine cancer: sister No FH of Colon Cancer:  Social History: Reviewed history from 04/30/2009 and no changes required. Retired Married Former Smoker quit 64 Alcohol use-no Illicit Drug Use - no Daily Caffeine Use coffee in the morning  Review of Systems  The patient denies allergy/sinus, anemia, anxiety-new, arthritis/joint pain, back pain, blood in urine, breast changes/lumps, change in vision, confusion, cough, coughing up blood, depression-new, fainting, fatigue, fever, headaches-new, hearing problems, heart murmur, heart rhythm changes, itching, menstrual pain, muscle pains/cramps, night sweats, nosebleeds, pregnancy symptoms, shortness of breath, skin rash, sleeping problems, sore throat, swelling of feet/legs, swollen lymph glands, thirst - excessive , urination - excessive , urination changes/pain, urine leakage, vision changes, and voice change.    Vital Signs:  Patient profile:   71 year old male Height:      69 inches Weight:      214.38 pounds BMI:     31.77 Pulse rate:   68 / minute Pulse rhythm:   regular BP sitting:   118 / 70  (left arm) Cuff size:   regular  Vitals Entered By: June McMurray CMA Duncan Dull) (May 28, 2009 10:15 AM)   Impression & Recommendations:  Problem # 1:  DYSPHAGIA UNSPECIFIED (ICD-787.20) Assessment Improved Plan esophageal dilatation p.r.n.  Problem # 2:  GERD (ICD-530.81) Assessment: Improved The patient is asymmetric on omeprazole.  We will continue with the same.  Should he develop recurrent symptoms I would consider switching him from voltarem to celebrex.  Patient Instructions: 1)  The medication list was reviewed and reconciled.  All changed / newly prescribed medications were explained.  A  complete medication list was provided to the patient / caregiver.

## 2010-04-22 NOTE — Procedures (Signed)
Summary: Upper Endoscopy  Patient: Avraj Lindroth Note: All result statuses are Final unless otherwise noted.  Tests: (1) Upper Endoscopy (EGD)   EGD Upper Endoscopy       DONE     [facility]     [address]     [place], [place]  [address]           ENDOSCOPY PROCEDURE REPORT           PATIENT:  Viola, Placeres  MR#:  [id number]     BIRTHDATE:  Dec 04, 1940, 68 yrs. old  GENDER:  male           ENDOSCOPIST:  Barbette Hair. Arlyce Dice, MD     Referred by:  Eugenio Hoes Tawanna Cooler, M.D.           PROCEDURE DATE:  05/01/2009     PROCEDURE:  EGD with biopsy     ASA CLASS:  Class II     INDICATIONS:  GERD           MEDICATIONS:   Fentanyl 50 mcg IV, Versed 5 mg IV, glycopyrrolate     (Robinal) 0.2 mg IV, 0.6cc simethancone 0.6 cc PO     TOPICAL ANESTHETIC:  Exactacain Spray           DESCRIPTION OF PROCEDURE:   After the risks benefits and     alternatives of the procedure were thoroughly explained, informed     consent was obtained.  The LB [id number] endoscope was     introduced through the mouth and advanced to the third portion of     the duodenum, without limitations.  The instrument was slowly     withdrawn as the mucosa was fully examined.     <<PROCEDUREIMAGES>>           irregular Z-line at the gastroesophageal junction. Irregular Z     line with island of gastric appearing mucosa 1cm above GE     junction. Bxs taken (see image1, image2, and image5).  A hiatal     hernia was found. 3cm hiatal hernia  Otherwise the examination was     normal.    Retroflexed views revealed no abnormalities.    The     scope was then withdrawn from the patient and the procedure     completed.           COMPLICATIONS:  None           ENDOSCOPIC IMPRESSION:     1) Irregular Z-line at the gastroesophageal junction     2) Hiatal hernia     3) Otherwise normal examination     RECOMMENDATIONS:     1) continue current medications     2) await biopsy results           REPEAT EXAM:   You will  receive a letter from Dr. Arlyce Dice in 1-2     weeks, after reviewing the final pathology, with followup     recommendations.           ______________________________     Barbette Hair Arlyce Dice, MD           CC:           n.     eSIGNED:   Barbette Hair. Jabin Tapp at 05/01/2009 03:51 PM           Aleatha Borer, [id number]  Note: An exclamation mark (!) indicates a result that was not dispersed into the flowsheet. Document Creation  Date: 05/01/2009 3:51 PM _______________________________________________________________________  (1) Order result status: Final Collection or observation date-time: 05/01/2009 15:37 Requested date-time:  Receipt date-time:  Reported date-time:  Referring Physician:   Ordering Physician: Melvia Heaps [phone]) Specimen Source:  Source: Launa Grill Order Number: [id number] Lab site:

## 2010-05-20 ENCOUNTER — Ambulatory Visit: Payer: Self-pay | Admitting: Family Medicine

## 2010-05-20 ENCOUNTER — Encounter: Payer: Self-pay | Admitting: Family Medicine

## 2010-05-20 ENCOUNTER — Ambulatory Visit (INDEPENDENT_AMBULATORY_CARE_PROVIDER_SITE_OTHER): Payer: Medicare Other | Admitting: Family Medicine

## 2010-05-20 DIAGNOSIS — E039 Hypothyroidism, unspecified: Secondary | ICD-10-CM

## 2010-05-20 DIAGNOSIS — G4700 Insomnia, unspecified: Secondary | ICD-10-CM

## 2010-05-20 DIAGNOSIS — E785 Hyperlipidemia, unspecified: Secondary | ICD-10-CM

## 2010-05-20 DIAGNOSIS — K219 Gastro-esophageal reflux disease without esophagitis: Secondary | ICD-10-CM

## 2010-05-20 DIAGNOSIS — M1990 Unspecified osteoarthritis, unspecified site: Secondary | ICD-10-CM

## 2010-05-20 DIAGNOSIS — J309 Allergic rhinitis, unspecified: Secondary | ICD-10-CM

## 2010-05-20 MED ORDER — ATORVASTATIN CALCIUM 40 MG PO TABS: 40.0000 mg | ORAL_TABLET | Freq: Every day | ORAL | Status: DC

## 2010-05-20 MED ORDER — TRAZODONE HCL 50 MG PO TABS: 50.0000 mg | ORAL_TABLET | Freq: Every day | ORAL | Status: DC

## 2010-05-20 MED ORDER — DICLOFENAC SODIUM 75 MG PO TBEC: 75.0000 mg | DELAYED_RELEASE_TABLET | Freq: Two times a day (BID) | ORAL | Status: DC

## 2010-05-20 MED ORDER — OMEPRAZOLE 20 MG PO CPDR: 20.0000 mg | DELAYED_RELEASE_CAPSULE | Freq: Every day | ORAL | Status: DC

## 2010-05-20 NOTE — Patient Instructions (Signed)
Continue current medications follow-up in one year except stop the Zocor and take Lipitor 40

## 2010-05-20 NOTE — Progress Notes (Signed)
  Subjective:    Patient ID: Travis Schwartz, male    DOB: 1940-04-18, 70 y.o.   MRN: [id number] [name] is a 70 year old male, who comes in today for general physical examination because of the history of prostate cancer.  Surgery 1998, arthritis, high cholesterol, allergic rhinitis, reflux esophagitis hyperlipidemia.  He currently takes Zocor 80 mg nightly for hyperlipidemia.  Will switch to Lipitor 40  He takes Voltaren 75 mg b.i.d. For osteoarthritis.  He takes trazodone 50 mg nightly for sleep dysfunction.  He takes omeprazole 40 mg daily for reflux.  Recent endoscopy by GI.  He also takes an aspirin tablet daily, MiraLax for constipation.  He saw his urologist recently again, gave him a clean bill of health for surgery 1998.  PSA zero advised to get his follow-ups here. HPI    Review of Systems  Constitutional: Negative.   HENT: Negative.   Eyes: Negative.   Respiratory: Negative.   Cardiovascular: Negative.   Gastrointestinal: Negative.   Genitourinary: Negative.   Musculoskeletal: Negative.   Skin: Negative.   Neurological: Negative.   Hematological: Negative.   Psychiatric/Behavioral: Negative.        Objective:   Physical Exam  Constitutional: He is oriented to person, place, and time. He appears well-developed and well-nourished.  HENT:  Head: Normocephalic and atraumatic.  Right Ear: External ear normal.  Left Ear: External ear normal.  Nose: Nose normal.  Mouth/Throat: Oropharynx is clear and moist.  Eyes: Conjunctivae and EOM are normal. Pupils are equal, round, and reactive to light.  Neck: Normal range of motion. Neck supple. No JVD present. No tracheal deviation present. No thyromegaly present.  Cardiovascular: Normal rate, regular rhythm, normal heart sounds and intact distal pulses.  Exam reveals no gallop and no friction rub.   No murmur heard. Pulmonary/Chest: Effort normal and breath sounds normal. No stridor. No respiratory distress. He has no  wheezes. He has no rales. He exhibits no tenderness.  Abdominal: Soft. Bowel sounds are normal. He exhibits no distension and no mass. There is no tenderness. There is no rebound and no guarding.  Genitourinary: Penis normal. No penile tenderness.  Musculoskeletal: Normal range of motion. He exhibits no edema and no tenderness.  Lymphadenopathy:    He has no cervical adenopathy.  Neurological: He is alert and oriented to person, place, and time. He has normal reflexes. No cranial nerve deficit. He exhibits normal muscle tone.  Skin: Skin is warm and dry. No rash noted. No erythema. No pallor.       He has many, many, many seborrheic keratosis  Psychiatric: He has a normal mood and affect. His behavior is normal. Judgment and thought content normal.          Assessment & Plan:  Hyperlipidemia,,,,,,,,, stop Zocor, switched to Lipitor 40  Osteoarthritis.  Continue Voltaren, 75 mg b.i.d.  Sleep dysfunction.  Continue trazodone 50 mg nightly  Reflux esophagitis.  Continue omeprazole 40 mg daily.  History prostate cancer asymptomatic.  PSA zero.

## 2010-09-28 ENCOUNTER — Other Ambulatory Visit: Payer: Self-pay | Admitting: *Deleted

## 2010-09-28 DIAGNOSIS — E785 Hyperlipidemia, unspecified: Secondary | ICD-10-CM

## 2010-09-28 MED ORDER — ATORVASTATIN CALCIUM 40 MG PO TABS: 40.0000 mg | ORAL_TABLET | Freq: Every day | ORAL | Status: DC

## 2010-10-26 ENCOUNTER — Emergency Department (HOSPITAL_COMMUNITY): Payer: Medicare Other

## 2010-10-26 ENCOUNTER — Inpatient Hospital Stay (HOSPITAL_COMMUNITY)
Admission: EM | Admit: 2010-10-26 | Discharge: 2010-10-28 | DRG: 419 | Disposition: A | Discharge status: Home / Self Care | Payer: Medicare Other | Attending: General Surgery | Admitting: General Surgery

## 2010-10-26 DIAGNOSIS — Z7982 Long term (current) use of aspirin: Secondary | ICD-10-CM

## 2010-10-26 DIAGNOSIS — R1011 Right upper quadrant pain: Secondary | ICD-10-CM

## 2010-10-26 DIAGNOSIS — E785 Hyperlipidemia, unspecified: Secondary | ICD-10-CM | POA: Diagnosis present

## 2010-10-26 DIAGNOSIS — K219 Gastro-esophageal reflux disease without esophagitis: Secondary | ICD-10-CM | POA: Diagnosis present

## 2010-10-26 DIAGNOSIS — Z9889 Other specified postprocedural states: Secondary | ICD-10-CM

## 2010-10-26 DIAGNOSIS — R112 Nausea with vomiting, unspecified: Secondary | ICD-10-CM

## 2010-10-26 DIAGNOSIS — Z79899 Other long term (current) drug therapy: Secondary | ICD-10-CM

## 2010-10-26 DIAGNOSIS — Z8546 Personal history of malignant neoplasm of prostate: Secondary | ICD-10-CM

## 2010-10-26 DIAGNOSIS — Z87442 Personal history of urinary calculi: Secondary | ICD-10-CM

## 2010-10-26 DIAGNOSIS — K8062 Calculus of gallbladder and bile duct with acute cholecystitis without obstruction: Principal | ICD-10-CM | POA: Diagnosis present

## 2010-10-26 LAB — DIFFERENTIAL
Basophils Absolute: 0 10*3/uL (ref 0.0–0.1)
Lymphocytes Relative: 9 % — ABNORMAL LOW (ref 12–46)
Lymphs Abs: 0.6 10*3/uL — ABNORMAL LOW (ref 0.7–4.0)
Monocytes Absolute: 0.2 10*3/uL (ref 0.1–1.0)
Neutro Abs: 6.5 10*3/uL (ref 1.7–7.7)

## 2010-10-26 LAB — URINALYSIS, ROUTINE W REFLEX MICROSCOPIC
Nitrite: NEGATIVE
Protein, ur: 30 mg/dL — AB
Urobilinogen, UA: 1 mg/dL (ref 0.0–1.0)

## 2010-10-26 LAB — CBC
HCT: 44.7 % (ref 39.0–52.0)
Hemoglobin: 14.9 g/dL (ref 13.0–17.0)
MCV: 92.7 fL (ref 78.0–100.0)
WBC: 7.3 10*3/uL (ref 4.0–10.5)

## 2010-10-26 LAB — COMPREHENSIVE METABOLIC PANEL
Alkaline Phosphatase: 145 U/L — ABNORMAL HIGH (ref 39–117)
BUN: 17 mg/dL (ref 6–23)
Chloride: 100 mEq/L (ref 96–112)
GFR calc Af Amer: >60 mL/min (ref >60)
GFR calc non Af Amer: >60 mL/min (ref >60)
Glucose, Bld: 115 mg/dL — ABNORMAL HIGH (ref 70–99)
Potassium: 3.8 mEq/L (ref 3.5–5.1)
Total Bilirubin: 2.6 mg/dL — ABNORMAL HIGH (ref 0.3–1.2)

## 2010-10-26 LAB — LIPASE, BLOOD: Lipase: 18 U/L (ref 11–59)

## 2010-10-27 ENCOUNTER — Inpatient Hospital Stay (HOSPITAL_COMMUNITY): Payer: Medicare Other

## 2010-10-27 ENCOUNTER — Other Ambulatory Visit (INDEPENDENT_AMBULATORY_CARE_PROVIDER_SITE_OTHER): Payer: Self-pay | Admitting: General Surgery

## 2010-10-27 DIAGNOSIS — K812 Acute cholecystitis with chronic cholecystitis: Secondary | ICD-10-CM

## 2010-10-27 LAB — COMPREHENSIVE METABOLIC PANEL
ALT: 592 U/L — ABNORMAL HIGH (ref 0–53)
CO2: 27 mEq/L (ref 19–32)
Calcium: 9 mg/dL (ref 8.4–10.5)
Creatinine, Ser: 0.93 mg/dL (ref 0.50–1.35)
GFR calc Af Amer: >60 mL/min (ref >60)
GFR calc non Af Amer: >60 mL/min (ref >60)
Glucose, Bld: 101 mg/dL — ABNORMAL HIGH (ref 70–99)
Sodium: 139 mEq/L (ref 135–145)

## 2010-10-27 LAB — CBC
MCH: 30.7 pg (ref 26.0–34.0)
MCHC: 33.2 g/dL (ref 30.0–36.0)
Platelets: 272 10*3/uL (ref 150–400)
RBC: 4.27 MIL/uL (ref 4.22–5.81)

## 2010-10-27 LAB — MRSA PCR SCREENING: MRSA by PCR: NEGATIVE

## 2010-10-27 MED ORDER — GADOBENATE DIMEGLUMINE 529 MG/ML IV SOLN
20.0000 mL | Freq: Once | INTRAVENOUS | Status: AC | PRN
  Administered 2010-10-27: 20 mL via INTRAVENOUS

## 2010-10-28 LAB — COMPREHENSIVE METABOLIC PANEL
AST: 141 U/L — ABNORMAL HIGH (ref 0–37)
Albumin: 3.7 g/dL (ref 3.5–5.2)
Calcium: 9.4 mg/dL (ref 8.4–10.5)
Chloride: 102 mEq/L (ref 96–112)
Creatinine, Ser: 1 mg/dL (ref 0.50–1.35)
Total Protein: 7.2 g/dL (ref 6.0–8.3)

## 2010-10-28 NOTE — H&P (Signed)
NAMEMarland Kitchen  Travis Schwartz, Travis Schwartz NO.:  [id number]  MEDICAL RECORD NO.:  [id number]  LOCATION:  1312                         FACILITY:  [facility]  PHYSICIAN:  Angelia Mould. Derrell Lolling, M.D.DATE OF BIRTH:  1940-06-01  DATE OF ADMISSION:  10/26/2010 DATE OF DISCHARGE:                             HISTORY & PHYSICAL   CHIEF COMPLAINT:  Abdominal pain, nausea, and vomiting.  HISTORY OF PRESENT ILLNESS:  This is a 70 year old Caucasian gentleman in generally good health.  Approximately 24 hours ago, after supper, he developed epigastric pain and repeated bouts of nausea and vomiting. The vomiting subsided this morning, but he continues to have the epigastric pain.  He had a similar episode 2 weeks ago, which was milder and only lasted a few hours.  He has now had unremitting pain for 24 hours.  His bowel movements have been normal.  He denies fever or chills.  He denies any history of liver disease, cardiac disease, pulmonary disease, or diabetes.  He presented to the emergency room.  Lab work shows elevated liver function tests.  Gallbladder ultrasound shows gallstones, gallbladder wall thickening, and a common bile duct which is 5.7 mm and is not dilated.  He has been started on antibiotics.  He has been given pain medicine and feels much better.  He is being admitted for management of his biliary tract disease.  PAST HISTORY: 1. Open prostatectomy in [facility] by Dr. Wanda Plump several years ago     for early prostate cancer. 2. Tonsillectomy as a child. 3. Hyperlipidemia. 4. Kidney stones 20 years ago without recurrence. 5. Upper endoscopy by [facility] with negative findings according to     the patient.  CURRENT MEDICATIONS: 1. Aspirin 81 mg a day. 2. Diclofenac 150 mg a day. 3. Omeprazole 40 mg a day. 4. Simvastatin 30 mg a day. 5. Trazodone 50 mg a day.  DRUG ALLERGIES:  None known.  SOCIAL HISTORY:  The patient is married.  They have one living son. They lost one  son to a motor vehicle accident.  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  He quit smoking in 1965.  He drinks one beer a month.  FAMILY HISTORY:  Mother died in her 24s, had COPD.  Father died of some type of blood dyscrasia.  REVIEW OF SYSTEMS:  A 10-system review of systems is performed and is negative except as described above.  PHYSICAL EXAMINATION:  GENERAL:  A pleasant healthy-appearing gentleman who appears fit for his age.  He is in minimal distress.  His wife is with him. VITAL SIGNS:  Temperature 98.4, heart rate 57, blood pressure 129/67, respiratory rate 20, oxygen saturation 96% on room air. HEENT:  Eyes, sclerae clear, I do not really see any icterus.  Ears, mouth, throat, nose, lips, tongue, and oropharynx are without gross lesions. NECK:  Supple, nontender.  No mass.  No jugular venous distention. Thyroid normal.  Trachea midline. LUNGS:  Clear to auscultation.  No chest wall tenderness. HEART:  Regular rate and rhythm.  No murmurs.  No gallop.  Radial, femoral, and dorsalis pedis pulses are palpable.  No peripheral edema. ABDOMEN:  Soft, not distended, no  mass.  Liver and spleen not enlarged. Mild subjective epigastric and right upper quadrant tenderness, but objectively, there is no guarding, mass, or rebound.  Well-healed lower midline scar.  No hernias noted. EXTREMITIES:  Moves all four extremities well without pain or deformity. NEUROLOGIC:  No gross motor or sensory deficits.  ADMISSION DATA:  Ultrasound shows gallstones, gallbladder wall thickening and a common bile duct that is normal at 5.7 mm.  Hemoglobin 14.9, white blood cell count 7300, BUN 17, creatinine 0.7, lipase 18, total bilirubin 2.6, SGOT 471, SGPT 617, alkaline phosphatase 145.  IMPRESSION: 1. Acute cholecystitis with cholelithiasis. 2. Possible choledocholithiasis. 3. Status post open prostatectomy. 4. Hyperlipidemia.  PLAN: 1. The patient will be admitted.   He will be kept n.p.o. 2. He will be started on broad-spectrum IV antibiotics. 3. We will repeat his lab work in the morning.  If his liver function     tests remain elevated, we will consider a consultation with     gastroenterology for possible ERCP for common bile duct stones.  If     his liver function test fall, we might consider     proceeding with cholecystectomy.  I have discussed the indication and details of laparoscopic cholecystectomy and open cholecystectomy with him and his wife. Indications and details of the surgery were outlined.  Risks and complications were discussed in detail, including, but not limited to bleeding, infection, conversion to open laparotomy, bile leak, wound problems, and cardiac, pulmonary, and thromboembolic problems.  He seems to understand all these issues well. At this time, all of his questions are answered.  He is in full agreement with our treatment plan.     Angelia Mould. Derrell Lolling, M.D.     HMI/MEDQ  D:  10/26/2010  T:  10/27/2010  Job:  [id number]  cc:   Kelle Darting, MD [facility]  Electronically Signed by Claud Kelp M.D. on 10/28/2010 07:05:14 AM

## 2010-10-30 NOTE — Op Note (Signed)
NAME:  Travis Schwartz, VIERNES NO.:  [id number]  MEDICAL RECORD NO.:  [id number]  LOCATION:  1312                         FACILITY:  [facility]  PHYSICIAN:  Lodema Pilot, MD       DATE OF BIRTH:  1940-11-30  DATE OF PROCEDURE:  10/27/2010 DATE OF DISCHARGE:                              OPERATIVE REPORT   PROCEDURE:  Laparoscopic cholecystectomy with intraoperative cholangiogram.  PREOPERATIVE DIAGNOSES:  Choledocholithiasis and symptomatic cholelithiasis.  POSTOPERATIVE DIAGNOSES:  History of choledocholithiasis and acute cholecystitis.  SURGEON:  Lodema Pilot, MD  ASSISTANT:  Juanetta Gosling, MD  ANESTHESIA:  General endotracheal anesthesia with 40 mL of 1% lidocaine with epinephrine and 0.25% Marcaine injected in a 50/50 mixture.  FLUIDS:  1000 mL of crystalloid.  URINE OUTPUT:  250 mL.  ESTIMATED BLOOD LOSS:  50 mL.  DRAINS:  None.  SPECIMENS:  Gallbladder and contents sent to pathology for permanent sectioning.  COMPLICATIONS:  None apparent.  FINDINGS:  Thickened gallbladder wall and surrounding inflammatory changes consistent with acute cholecystitis, did have gallstones within the gallbladder but normal cholangiogram.  INDICATIONS FOR PROCEDURE:  [name] is a 70 year old male who presents with frequent episodes of right upper quadrant pain after eating.  He presented last night to the emergency room with persistence of his symptoms and he had elevated LFTs, although MRCP was negative for any retained gallstones.  OPERATIVE DETAILS:  Mr. Wolke was seen and evaluated in the preoperative area and risks of the procedure were again discussed in lay terms.  Informed consent was obtained.  He was taken to the operating room, placed on table in supine position.  A Foley catheter was placed. His abdomen was prepped and draped in a standard surgical fashion. Because he had a lower midline incision, I accessed his abdomen through midline incision  just superior to his umbilicus under direct visualization and a blunt trocar was placed.  Pneumoperitoneum was obtained and a laparoscope was introduced.  There was no evidence of bowel injury upon entry.  A 11 mm epigastric trocar was placed and two 5 mm right upper quadrant trocars were placed under direct visualization and gallbladder was retracted cephalad.  He did have a thickened gallbladder wall with a rind which was very friable and difficult to grab although is able to take down the peritoneal attachments on the gallbladder and the cystic duct was identified as well as the cystic artery.  It was skeletonized and the critical view of safety was obtained visualizing a single cystic duct entering the gallbladder and a single vascular structure coursing up on the gallbladder through the triangle of Calot.  The liver parenchyma was visualized.  The cystic duct was skeletonized and a clip was placed on the gallbladder side and small cystic ductotomy was made and a cholangiogram catheter placed through separate stab incision into the cystic duct.  The cholangiogram was performed which demonstrated long cystic duct and very dilated common bile duct with a free flow bowel into the duodenum and filling of the right and left hepatic ducts without any evidence of filling defects within the common bile duct.  The cholangiogram catheter was removed and three clips were  placed on the stay side of the cystic duct and the duct was transected.  The artery was transected as well between hemoclips and the gallbladder was taken off of the gallbladder fossa using Bovie electrocautery.  The gallbladder was entered during the removal from the gallbladder fossa which spilled some purulent appearing material and some whitish bile was extracted after the purulence.  There was no spillage of any gallstone.  The gallbladder was completely removed from the gallbladder fossa and removed from all umbilical  trocar site in an EndoCatch bag that was opened up on the back table, and noted have a single cystic duct, and gallstones.  The gallbladder fossa was inspected for hemostasis which was obtained by Bovie electrocautery and the right upper quadrant was irrigated with sterile saline solution until irrigation returned clear.  There was no evidence of bleeding.  Clips appeared to be in good position and the right upper quadrant trocars were removed under direct visualization.  The umbilical trocar was removed and the fascia was approximated with interrupted 0 Vicryl sutures in an open fashion.  Prior to securing the sutures, the lap scope was introduced again through the epigastric trocar site and the midline closure was inspected.  It was noted to be adequate and there was no evidence of bowel injury during closure.  The right upper quadrant again appeared to be hemostatic.  There was no evidence of bleeding or bowel injury, and the epigastric trocar was removed.  The umbilical fascia sutures were secured and the wounds were irrigated with sterile saline solution.  Skin edge were approximated with 4-0 Monocryl subcuticular suture and the skin was washed and dried, and Dermabond was applied.  All sponge, needle and instrument counts were correct in the case and the patient tolerated the procedure well.  There was no apparent complications.          ______________________________ Lodema Pilot, MD     BL/MEDQ  D:  10/27/2010  T:  10/28/2010  Job:  [id number]  Electronically Signed by Lodema Pilot DO on 10/30/2010 06:02:49 PM

## 2010-11-01 ENCOUNTER — Ambulatory Visit: Payer: PRIVATE HEALTH INSURANCE | Admitting: Family Medicine

## 2010-11-15 ENCOUNTER — Encounter (INDEPENDENT_AMBULATORY_CARE_PROVIDER_SITE_OTHER): Payer: Self-pay | Admitting: General Surgery

## 2010-11-17 ENCOUNTER — Ambulatory Visit (INDEPENDENT_AMBULATORY_CARE_PROVIDER_SITE_OTHER): Payer: Medicare Other | Admitting: General Surgery

## 2010-11-17 ENCOUNTER — Encounter (INDEPENDENT_AMBULATORY_CARE_PROVIDER_SITE_OTHER): Payer: Self-pay | Admitting: General Surgery

## 2010-11-17 VITALS — BP 126/81 | HR 64 | Temp 98.3°F | Ht 69.0 in | Wt 212.6 lb

## 2010-11-17 DIAGNOSIS — Z5189 Encounter for other specified aftercare: Secondary | ICD-10-CM

## 2010-11-17 DIAGNOSIS — Z4889 Encounter for other specified surgical aftercare: Secondary | ICD-10-CM

## 2010-11-17 NOTE — Progress Notes (Signed)
Subjective:     Patient ID: Travis Schwartz, male   DOB: 1941/03/07, 70 y.o.   MRN: [id number]  HPI This patient is 2 weeks status post laparoscopic cholecystectomy for acute cholecystitis. He reports that he is doing very well. He denies taking any pain medication after his procedure. He states that he is eating well and has not had any difficulties. He is taking regular diet. He states his bowels are functioning well his pathology was benign. His back at his regular activities.  Review of Systems     Objective:   Physical Exam No acute distress and nontoxic-appearing  His abdomen is soft nontender and nondistended his incisions are well-healed without sign of infection.    Assessment:     Status post laparoscopic cholecystectomy-doing very well.    Plan:     He is doing very well. He can follow up with me on a p.r.n. basis. Pathology was benign.

## 2010-11-17 NOTE — Progress Notes (Deleted)
Subjective:     Patient ID: Travis Schwartz, male   DOB: [birth year], 70 y.o.   MRN: [id number]  HPI   Review of Systems     Objective:   Physical Exam     Assessment:     ***    Plan:     ***

## 2010-11-30 NOTE — Discharge Summary (Signed)
  NAMEMarland Kitchen  Travis Schwartz, Travis Schwartz NO.:  [id number]  MEDICAL RECORD NO.:  [id number]  LOCATION:  1312                         FACILITY:  [facility]  PHYSICIAN:  Lodema Pilot, MD       DATE OF BIRTH:  18-Feb-1941  DATE OF ADMISSION:  10/26/2010 DATE OF DISCHARGE:  10/28/2010                              DISCHARGE SUMMARY   ADMISSION DIAGNOSES: 1. Acute cholecystitis with cholelithiasis. 2. Possible choledocholithiasis. 3. Status post open prostatectomy. 4. Dyslipidemia.  DISCHARGE DIAGNOSES: 1. Acute cholecystitis with cholelithiasis. 2. Possible choledocholithiasis. 3. Status post open prostatectomy. 4. Dyslipidemia.  PROCEDURES: 1. Laparoscopic cholecystectomy with intraoperative cholangiogram,     October 27, 2010. 2. Magnetic resonance cholangiopancreatography, October 27, 2010, showed     a solitary gallstone with no significant biliary dilatation or     evidence of choledocholithiasis.  Prominence of the gallbladder     wall appeared to improve, however, with ultrasound done day prior.     Hepatic steatosis without focal liver lesion.  Normal pancreatic     ductal system.  BRIEF HISTORY:  The patient is a 70 year old gentleman who 24-hours prior to admission developed epigastric pain and repeated bouts of nausea and vomiting.  He presented to the ER where a gallbladder ultrasound showed gallstones, gallbladder wall thickening, and the common bile duct was slightly dilated 5.7 mm.  He was started on antibiotics, given some pain relief and was seen, felt much better by the time.  He was seen, evaluated by Dr. Derrell Lolling.  The patient was subsequently admitted, placed on antibiotics.  HOSPITAL COURSE:  The patient was admitted and seen in the a.m. by Dr. Lodema Pilot.  He obtained an MRCP because of his elevated LFTs.  MRCP as above.  After reviewing the study, Dr. Biagio Quint took the patient to the OR.  He had undergone a laparoscopic cholecystectomy.  Postoperatively, he is  doing quite well.  He has had breakfast, full liquids, is doing well and incisions were good.  He is passing gas and he is wondering when he can go home.  We tentatively plan to send him home after lunch. We will have him follow up on November 16, 2010, at the [facility].  DISCHARGE MEDICATIONS: 1. Tylenol 650 q.6 p.r.n. for pain. 2. Oxycodone 5/325 one to two q.4 p.r.n. for pain. 3. He can remain on his preadmission aspirin 81 mg daily. 4. Diclofenac 75 mg daily. 5. Prilosec 1/2 tablet 20 mg daily. 6. Simvastatin 30 mg daily. 7. Trazodone 50 mg at bedtime.  DISCHARGE ACTIVITY:  Light to moderate.  INSTRUCTIONS:  Clean his wounds with plain soap and water.  No body emergent until incisions are healed.  He is to contact if has any problems including fever, abdominal pain, problems with his incisions, or trouble voiding.     Travis Schwartz, P.A.   ______________________________ Lodema Pilot, MD    WDJ/MEDQ  D:  10/28/2010  T:  10/28/2010  Job:  [id number]  cc:   Tinnie Gens A. Tawanna Cooler, MD [address] [place] [address]  Electronically Signed by Sherrie George P.A. on 11/09/2010 06:11:46 PM Electronically Signed by Lodema Pilot DO on 11/30/2010 03:50:22 PM

## 2011-05-09 ENCOUNTER — Encounter: Payer: Self-pay | Admitting: Gastroenterology

## 2011-07-04 ENCOUNTER — Other Ambulatory Visit: Payer: Self-pay | Admitting: *Deleted

## 2011-07-04 DIAGNOSIS — K219 Gastro-esophageal reflux disease without esophagitis: Secondary | ICD-10-CM

## 2011-07-04 MED ORDER — OMEPRAZOLE 20 MG PO CPDR: 20.0000 mg | DELAYED_RELEASE_CAPSULE | Freq: Every day | ORAL | Status: DC

## 2011-08-17 ENCOUNTER — Ambulatory Visit (INDEPENDENT_AMBULATORY_CARE_PROVIDER_SITE_OTHER): Payer: Medicare Other | Admitting: Family Medicine

## 2011-08-17 ENCOUNTER — Encounter: Payer: Self-pay | Admitting: Family Medicine

## 2011-08-17 VITALS — BP 160/90 | Temp 98.3°F | Ht 69.25 in | Wt 223.0 lb

## 2011-08-17 DIAGNOSIS — G4700 Insomnia, unspecified: Secondary | ICD-10-CM

## 2011-08-17 DIAGNOSIS — R030 Elevated blood-pressure reading, without diagnosis of hypertension: Secondary | ICD-10-CM

## 2011-08-17 DIAGNOSIS — Z8546 Personal history of malignant neoplasm of prostate: Secondary | ICD-10-CM

## 2011-08-17 DIAGNOSIS — K219 Gastro-esophageal reflux disease without esophagitis: Secondary | ICD-10-CM

## 2011-08-17 DIAGNOSIS — Z0000 Encounter for general adult medical examination without abnormal findings: Secondary | ICD-10-CM

## 2011-08-17 DIAGNOSIS — E785 Hyperlipidemia, unspecified: Secondary | ICD-10-CM

## 2011-08-17 LAB — POCT URINALYSIS DIPSTICK
Blood, UA: NEGATIVE
Glucose, UA: NEGATIVE
Leukocytes, UA: NEGATIVE
pH, UA: 5.5

## 2011-08-17 LAB — CBC WITH DIFFERENTIAL/PLATELET
Eosinophils Relative: 1.5 % (ref 0.0–5.0)
HCT: 45.4 % (ref 39.0–52.0)
Hemoglobin: 15.3 g/dL (ref 13.0–17.0)
Lymphs Abs: 1.6 10*3/uL (ref 0.7–4.0)
Monocytes Relative: 6.8 % (ref 3.0–12.0)
Neutro Abs: 3.6 10*3/uL (ref 1.4–7.7)
WBC: 5.7 10*3/uL (ref 4.5–10.5)

## 2011-08-17 LAB — BASIC METABOLIC PANEL
BUN: 25 mg/dL — ABNORMAL HIGH (ref 6–23)
CO2: 29 mEq/L (ref 19–32)
Calcium: 9.5 mg/dL (ref 8.4–10.5)
Chloride: 103 mEq/L (ref 96–112)
Creatinine, Ser: 0.9 mg/dL (ref 0.4–1.5)

## 2011-08-17 LAB — LIPID PANEL
HDL: 40.8 mg/dL (ref >39.00)
Total CHOL/HDL Ratio: 4
Triglycerides: 220 mg/dL — ABNORMAL HIGH (ref 0.0–149.0)
VLDL: 44 mg/dL — ABNORMAL HIGH (ref 0.0–40.0)

## 2011-08-17 LAB — PSA: PSA: 0.05 ng/mL — ABNORMAL LOW (ref 0.10–4.00)

## 2011-08-17 LAB — TSH: TSH: 3.85 u[IU]/mL (ref 0.35–5.50)

## 2011-08-17 LAB — HEPATIC FUNCTION PANEL: Total Bilirubin: 1 mg/dL (ref 0.3–1.2)

## 2011-08-17 MED ORDER — TRAZODONE HCL 50 MG PO TABS: 50.0000 mg | ORAL_TABLET | Freq: Every day | ORAL | Status: DC

## 2011-08-17 MED ORDER — ATORVASTATIN CALCIUM 40 MG PO TABS: 40.0000 mg | ORAL_TABLET | Freq: Every day | ORAL | Status: DC

## 2011-08-17 MED ORDER — OMEPRAZOLE 20 MG PO CPDR: 20.0000 mg | DELAYED_RELEASE_CAPSULE | Freq: Every day | ORAL | Status: DC

## 2011-08-17 NOTE — Progress Notes (Signed)
  Subjective:    Patient ID: Travis Schwartz, male    DOB: 1940/10/11, 71 y.o.   MRN: [id number]  HPI Travis Schwartz is a 71 year old married male nonsmoker who comes in today for a Medicare wellness examination because of a history of hyperlipidemia, osteoarthritis, reflux esophagitis and sleep dysfunction  His medication reviewed in detail  For the past 2 weeks has had pain in his right hip no history of trauma. He was walking on a treadmill daily for exercise. He then began taking Aleve twice daily on top of this alternans 75 mg twice daily BP today 160/90 advised to stop the Voltaren and just take the Aleve  He gets routine eye care, dental care, colonoscopy and GI, Pneumovax x2, tetanus 2007, shingles 2009.  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   HENT: Negative.   Eyes: Negative.   Respiratory: Negative.   Cardiovascular: Negative.   Gastrointestinal: Negative.   Genitourinary: Negative.   Musculoskeletal: Negative.   Skin: Negative.   Neurological: Negative.   Hematological: Negative.   Psychiatric/Behavioral: Negative.        Objective:   Physical Exam  Constitutional: He is oriented to person, place, and time. He appears well-developed and well-nourished.  HENT:  Head: Normocephalic and atraumatic.  Right Ear: External ear normal.  Left Ear: External ear normal.  Nose: Nose normal.  Mouth/Throat: Oropharynx is clear and moist.  Eyes: Conjunctivae and EOM are normal. Pupils are equal, round, and reactive to light.  Neck: Normal range of motion. Neck supple. No JVD present. No tracheal deviation present. No thyromegaly present.  Cardiovascular: Normal rate, regular rhythm, normal heart sounds and intact distal pulses.  Exam reveals no gallop and no friction rub.   No murmur  heard. Pulmonary/Chest: Effort normal and breath sounds normal. No stridor. No respiratory distress. He has no wheezes. He has no rales. He exhibits no tenderness.  Abdominal: Soft. Bowel sounds are normal. He exhibits no distension and no mass. There is no tenderness. There is no rebound and no guarding.  Genitourinary: Rectum normal and penis normal. Guaiac negative stool. No penile tenderness.       Prostate has been surgically removed  Musculoskeletal: Normal range of motion. He exhibits no edema and no tenderness.  Lymphadenopathy:    He has no cervical adenopathy.  Neurological: He is alert and oriented to person, place, and time. He has normal reflexes. No cranial nerve deficit. He exhibits normal muscle tone.  Skin: Skin is warm and dry. No rash noted. No erythema. No pallor.       Total body skin exam shows many many seborrheic keratosis. Scars from previous laparoscopic cholecystectomy well healed also scar midline the pubis from previous prostate removal  Psychiatric: He has a normal mood and affect. His behavior is normal. Judgment and thought content normal.          Assessment & Plan:  Healthy male  Hyperlipidemia,,,,,,, continue Lipitor 40 mg daily and an aspirin tablet  Osteoarthritis stop the Voltaren and Aleve one twice a day or through consult if symptoms persist  Sleep dysfunction continue Desyrel 50 mg each bedtime  Multiple seborrheic keratosis and sun damage recommend sunscreens  Recent cholecystectomy  History of prostate cancer prostate has been removed

## 2011-08-17 NOTE — Patient Instructions (Signed)
Stop the Voltaren  Aleve one twice daily  If after 2-3 weeks your hip pain persists consult with the orthopedist  Lipitor is a 40 mg tablet,,,,,,,,,,,, take a full tablet daily  Return in one year sooner if any problems  Remember to wear your sunscreens SPF 50+

## 2011-08-29 ENCOUNTER — Telehealth: Payer: Self-pay | Admitting: Family Medicine

## 2011-08-29 NOTE — Telephone Encounter (Signed)
Left a message on patient's voice mail to return my call. 

## 2011-08-29 NOTE — Telephone Encounter (Signed)
Please advise 

## 2011-08-29 NOTE — Telephone Encounter (Signed)
On pt last office visit he was told to stop the Voltaren and Aleve one twice a day or through consult if symptoms persist. Pt states that this is not helping his joint pain and would like to get back on Voltaren. Please contact pt

## 2011-08-29 NOTE — Telephone Encounter (Signed)
Recommend Motrin 600 mg twice daily with food this is OTC if symptoms persist and the Motrin does not help then recommend a consult with Dr. Albertha Ghee at [facility]  orthopedics s

## 2011-08-30 NOTE — Telephone Encounter (Signed)
Left message on machine for patient

## 2012-01-27 ENCOUNTER — Other Ambulatory Visit: Payer: Self-pay | Admitting: Surgery

## 2012-02-27 ENCOUNTER — Ambulatory Visit (INDEPENDENT_AMBULATORY_CARE_PROVIDER_SITE_OTHER): Payer: Medicare Other | Admitting: Family Medicine

## 2012-02-27 ENCOUNTER — Encounter: Payer: Self-pay | Admitting: Family Medicine

## 2012-02-27 VITALS — BP 140/90 | Temp 98.2°F | Wt 226.0 lb

## 2012-02-27 DIAGNOSIS — K219 Gastro-esophageal reflux disease without esophagitis: Secondary | ICD-10-CM

## 2012-02-27 NOTE — Patient Instructions (Addendum)
No caffeine peppermint or aspirin  Nothing to eat or drink for 2 hours prior to bedtime  Sleep on 2 pillows  Prilosec 20 mg,,,,,,,,,, one twice daily for 4 weeks then one daily in the morning for 3 months  If after 4 weeks her symptoms are not completely resolved where they get worse or you develop other symptoms like difficulty swallowing call Dr. Arlyce Dice immediately

## 2012-02-27 NOTE — Progress Notes (Signed)
  Subjective:    Patient ID: Travis Schwartz, male    DOB: [birth year], 71 y.o.   MRN: [id number]  HPI Travis Schwartz is a 71 year old male nonsmoker who comes in today with a years history of reflux esophagitis  His symptoms are related to upper esophageal symptoms with cough. He does take an aspirin tablet daily has also been on diclofenac. No difficulty swallowing. 2 years ago he had any upper endoscopy by Dr. Arlyce Dice and it was noted he had a hiatal hernia.  No fever chills weight loss etc. bowel movements normal symptoms now have been present for over a year.  He thinks they've gotten worse since he had his gallbladder out therefore he may also have a component of bile reflux   Review of Systems General and GI review of systems otherwise negative    Objective:   Physical Exam Well-developed well-nourished male no acute distress examination the abdomen was negative except for scars from previous surgery.       Assessment & Plan:  Symptoms of reflux esophagitis with documented hiatal hernia by endoscopy 2011  Plan no caffeine and peppermint,,,,,,,,,,, patient does not drink nor smoke,,,,,,,,,,,, no aspirin or aspirin products nothing to eat or drink for 3 hours prior to bedtime Prilosec 20 mg twice a day for 4 weeks then 20 mg daily for 3 months. If symptoms do not improve within 4 weeks or they get worse or he develops other symptoms of difficulty swallowing etc. he's to call his GI immediately

## 2012-04-16 ENCOUNTER — Encounter: Payer: Self-pay | Admitting: Gastroenterology

## 2012-09-27 ENCOUNTER — Telehealth: Payer: Self-pay | Admitting: Family Medicine

## 2012-09-27 DIAGNOSIS — E785 Hyperlipidemia, unspecified: Secondary | ICD-10-CM

## 2012-09-27 MED ORDER — ATORVASTATIN CALCIUM 40 MG PO TABS: 40.0000 mg | ORAL_TABLET | Freq: Every day | ORAL | Status: DC

## 2012-09-27 NOTE — Telephone Encounter (Signed)
PT called to request a 1 month refill of his atorvastatin (LIPITOR) 40 MG tablet. He would like it called into [facility] on [address]. Please assist.

## 2012-10-10 ENCOUNTER — Encounter: Payer: Medicare Other | Admitting: Family Medicine

## 2012-10-17 ENCOUNTER — Encounter: Payer: Self-pay | Admitting: Family

## 2012-10-17 ENCOUNTER — Ambulatory Visit (INDEPENDENT_AMBULATORY_CARE_PROVIDER_SITE_OTHER): Payer: Medicare Other | Admitting: Family

## 2012-10-17 VITALS — BP 144/90 | HR 64 | Ht 69.0 in | Wt 216.0 lb

## 2012-10-17 DIAGNOSIS — Z125 Encounter for screening for malignant neoplasm of prostate: Secondary | ICD-10-CM

## 2012-10-17 DIAGNOSIS — K219 Gastro-esophageal reflux disease without esophagitis: Secondary | ICD-10-CM

## 2012-10-17 DIAGNOSIS — M1990 Unspecified osteoarthritis, unspecified site: Secondary | ICD-10-CM

## 2012-10-17 DIAGNOSIS — Z0000 Encounter for general adult medical examination without abnormal findings: Secondary | ICD-10-CM

## 2012-10-17 DIAGNOSIS — E7800 Pure hypercholesterolemia, unspecified: Secondary | ICD-10-CM

## 2012-10-17 LAB — BASIC METABOLIC PANEL
BUN: 24 mg/dL — ABNORMAL HIGH (ref 6–23)
CO2: 29 mEq/L (ref 19–32)
Calcium: 9.8 mg/dL (ref 8.4–10.5)
Creatinine, Ser: 1 mg/dL (ref 0.4–1.5)
GFR: 79.74 mL/min (ref >60.00)
Glucose, Bld: 85 mg/dL (ref 70–99)
Sodium: 141 mEq/L (ref 135–145)

## 2012-10-17 LAB — CBC WITH DIFFERENTIAL/PLATELET
Basophils Relative: 0.4 % (ref 0.0–3.0)
Eosinophils Relative: 1.2 % (ref 0.0–5.0)
HCT: 47.2 % (ref 39.0–52.0)
Hemoglobin: 16 g/dL (ref 13.0–17.0)
Lymphocytes Relative: 25.1 % (ref 12.0–46.0)
Lymphs Abs: 1.6 10*3/uL (ref 0.7–4.0)
Monocytes Relative: 7.9 % (ref 3.0–12.0)
Neutro Abs: 4.3 10*3/uL (ref 1.4–7.7)
RBC: 4.94 Mil/uL (ref 4.22–5.81)
RDW: 14.7 % — ABNORMAL HIGH (ref 11.5–14.6)
WBC: 6.6 10*3/uL (ref 4.5–10.5)

## 2012-10-17 LAB — POCT URINALYSIS DIPSTICK
Leukocytes, UA: NEGATIVE
Protein, UA: NEGATIVE
Urobilinogen, UA: 0.2
pH, UA: 6

## 2012-10-17 LAB — LIPID PANEL
HDL: 40.6 mg/dL (ref >39.00)
LDL Cholesterol: 93 mg/dL (ref 0–99)
VLDL: 38.8 mg/dL (ref 0.0–40.0)

## 2012-10-17 LAB — HEPATIC FUNCTION PANEL
Albumin: 4.4 g/dL (ref 3.5–5.2)
Alkaline Phosphatase: 90 U/L (ref 39–117)
Total Protein: 6.7 g/dL (ref 6.0–8.3)

## 2012-10-17 LAB — PSA, MEDICARE: PSA: 0.04 ng/ml — ABNORMAL LOW (ref 0.10–4.00)

## 2012-10-17 MED ORDER — MELOXICAM 7.5 MG PO TABS: 7.5000 mg | ORAL_TABLET | Freq: Every day | ORAL | Status: DC

## 2012-10-17 NOTE — Patient Instructions (Addendum)
Osteoarthritis Osteoarthritis is the most common form of arthritis. It is redness, soreness, and swelling (inflammation) affecting the cartilage. Cartilage acts as a cushion, covering the ends of bones where they meet to form a joint. CAUSES  Over time, the cartilage begins to wear away. This causes bone to rub on bone. This produces pain and stiffness in the affected joints. Factors that contribute to this problem are:  Excessive body weight.  Age.  Overuse of joints. SYMPTOMS   People with osteoarthritis usually experience joint pain, swelling, or stiffness.  Over time, the joint may lose its normal shape.  Small deposits of bone (osteophytes) may grow on the edges of the joint.  Bits of bone or cartilage can break off and float inside the joint space. This may cause more pain and damage.  Osteoarthritis can lead to depression, anxiety, feelings of helplessness, and limitations on daily activities. The most commonly affected joints are in the:  Ends of the fingers.  Thumbs.  Neck.  Lower back.  Knees.  Hips. DIAGNOSIS  Diagnosis is mostly based on your symptoms and exam. Tests may be helpful, including:  X-rays of the affected joint.  A computerized magnetic scan (MRI).  Blood tests to rule out other types of arthritis.  Joint fluid tests. This involves using a needle to draw fluid from the joint and examining the fluid under a microscope. TREATMENT  Goals of treatment are to control pain, improve joint function, maintain a normal body weight, and maintain a healthy lifestyle. Treatment approaches may include:  A prescribed exercise program with rest and joint relief.  Weight control with nutritional education.  Pain relief techniques such as:  Properly applied heat and cold.  Electric pulses delivered to nerve endings under the skin (transcutaneous electrical nerve stimulation, TENS).  Massage.  Certain supplements. Ask your caregiver before using any  supplements, especially in combination with prescribed drugs.  Medicines to control pain, such as:  Acetaminophen.  Nonsteroidal anti-inflammatory drugs (NSAIDs), such as naproxen.  Narcotic or central-acting agents, such as tramadol. This drug carries a risk of addiction and is generally prescribed for short-term use.  Corticosteroids. These can be given orally or as injection. This is a short-term treatment, not recommended for routine use.  Surgery to reposition the bones and relieve pain (osteotomy) or to remove loose pieces of bone and cartilage. Joint replacement may be needed in advanced states of osteoarthritis. HOME CARE INSTRUCTIONS  Your caregiver can recommend specific types of exercise. These may include:  Strengthening exercises. These are done to strengthen the muscles that support joints affected by arthritis. They can be performed with weights or with exercise bands to add resistance.  Aerobic activities. These are exercises, such as brisk walking or low-impact aerobics, that get your heart pumping. They can help keep your lungs and circulatory system in shape.  Range-of-motion activities. These keep your joints limber.  Balance and agility exercises. These help you maintain daily living skills. Learning about your condition and being actively involved in your care will help improve the course of your osteoarthritis. SEEK MEDICAL CARE IF:   You feel hot or your skin turns red.  You develop a rash in addition to your joint pain.  You have an oral temperature above 102 F (38.9 C). FOR MORE INFORMATION  National Institute of Arthritis and Musculoskeletal and Skin Diseases: www.niams.nih.gov National Institute on Aging: www.nia.nih.gov American College of Rheumatology: www.rheumatology.org Document Released: 03/07/2005 Document Revised: 05/30/2011 Document Reviewed: 06/18/2009 Exit[employer] Patient Information 2014 [employer].  

## 2012-10-17 NOTE — Progress Notes (Signed)
Subjective:    Patient ID: Travis Schwartz, male    DOB: June 18, 1940, 72 y.o.   MRN: [id number]  HPI 72 year old white male, nonsmoker, patient of Dr. tied it and today for a complete physical exam. He had concerns of joint pain related to osteoarthritis for which he's been taking Aleve 4 times a day 4. He had previously been on an anti-inflammatory medication but was discontinued due to a history of a hiatal hernia. Osteoarthritis appears to be worsening. He currently takes Prilosec 20 mg once daily for GERD. Lipitor for cholesterol. Trazodone for sleep. All medications working effectively. Denies any concerns.   Review of Systems  Constitutional: Negative.   HENT: Negative.   Eyes: Negative.   Respiratory: Negative.   Cardiovascular: Negative.   Gastrointestinal: Negative.   Endocrine: Negative.   Genitourinary: Negative.   Musculoskeletal: Negative.   Skin: Negative.   Allergic/Immunologic: Negative.   Neurological: Negative.   Hematological: Negative.   Psychiatric/Behavioral: Negative.    Past Medical History  Diagnosis Date  . Arthritis   . Hyperlipidemia   . Cancer     prostate  . Allergy   . GERD (gastroesophageal reflux disease)   . Diverticulosis     2008    History   Social History  . Marital Status: Married    Spouse Name: N/A    Number of Children: N/A  . Years of Education: N/A   Occupational History  . Not on file.   Social History Main Topics  . Smoking status: Former Smoker    Types: Cigarettes    Quit date: 03/22/1963  . Smokeless tobacco: Former Neurosurgeon  . Alcohol Use: Yes  . Drug Use: No  . Sexually Active:    Other Topics Concern  . Not on file   Social History Narrative  . No narrative on file    Past Surgical History  Procedure Laterality Date  . Transurethral resection of prostate    . Tonsillectomy    . Cholecystectomy      Family History  Problem Relation Age of Onset  . Cancer Sister     uterine  . Hyperlipidemia Other    . COPD Other     colon    No Known Allergies  Current Outpatient Prescriptions on File Prior to Visit  Medication Sig Dispense Refill  . atorvastatin (LIPITOR) 40 MG tablet Take 1 tablet (40 mg total) by mouth daily.  30 tablet  0  . omeprazole (PRILOSEC) 20 MG capsule Take 1 capsule (20 mg total) by mouth daily.  100 capsule  3  . traZODone (DESYREL) 50 MG tablet Take 1 tablet (50 mg total) by mouth at bedtime.  100 tablet  3  . aspirin 81 MG tablet Take 81 mg by mouth daily.        . polyethylene glycol (MIRALAX / GLYCOLAX) packet Take 17 g by mouth daily.         No current facility-administered medications on file prior to visit.    BP 144/90  Pulse 64  Ht 5\' 9"  (1.753 m)  Wt 216 lb (97.977 kg)  BMI 31.88 kg/m2  SpO2 97%chart    Objective:   Physical Exam  Constitutional: He is oriented to person, place, and time. He appears well-developed and well-nourished.  HENT:  Head: Normocephalic.  Right Ear: External ear normal.  Left Ear: External ear normal.  Nose: Nose normal.  Mouth/Throat: Oropharynx is clear and moist.  Eyes: Conjunctivae and EOM are normal. Pupils are equal,  round, and reactive to light.  Neck: Normal range of motion. Neck supple. No thyromegaly present.  Cardiovascular: Normal rate, regular rhythm and normal heart sounds.   Pulmonary/Chest: Breath sounds normal.  Abdominal: Soft. Bowel sounds are normal.  Genitourinary: Rectum normal, prostate normal and penis normal.  Musculoskeletal: Normal range of motion.  Neurological: He is alert and oriented to person, place, and time. He has normal reflexes.  Skin: Skin is warm and dry.  Psychiatric: He has a normal mood and affect.          Assessment & Plan:  Assessment: 1. Complete physical exam 2. Osteoarthritis-generalized 3. Hyperlipidemia 4. GERD  Plan: Cautiously start Mobic 7.5 mg once daily. Patient advised to take Prilosec 20 mg every day. Warned patient against potential side effects  and anti-inflammatory medications to include GI bleed.r to GI for colonoscopy screening. Patient will continue current medications. Followup with primary care provider in 4 months and sooner as needed.

## 2012-10-18 ENCOUNTER — Encounter: Payer: Medicare Other | Admitting: Family

## 2012-10-22 ENCOUNTER — Telehealth: Payer: Self-pay | Admitting: Family Medicine

## 2012-10-22 DIAGNOSIS — E785 Hyperlipidemia, unspecified: Secondary | ICD-10-CM

## 2012-10-22 DIAGNOSIS — K219 Gastro-esophageal reflux disease without esophagitis: Secondary | ICD-10-CM

## 2012-10-22 DIAGNOSIS — G4700 Insomnia, unspecified: Secondary | ICD-10-CM

## 2012-10-22 MED ORDER — OMEPRAZOLE 20 MG PO CPDR: 20.0000 mg | DELAYED_RELEASE_CAPSULE | Freq: Every day | ORAL | Status: DC

## 2012-10-22 MED ORDER — TRAZODONE HCL 50 MG PO TABS: 50.0000 mg | ORAL_TABLET | Freq: Every day | ORAL | Status: DC

## 2012-10-22 MED ORDER — ATORVASTATIN CALCIUM 40 MG PO TABS: 40.0000 mg | ORAL_TABLET | Freq: Every day | ORAL | Status: DC

## 2012-10-22 NOTE — Telephone Encounter (Signed)
Refills sent to pharmacy. 

## 2012-10-22 NOTE — Telephone Encounter (Signed)
Pt states that when he was seen on Thursday 7/30, he was to receive refills of the following medications; atorvastatin (LIPITOR) 40 MG tablet, omeprazole (PRILOSEC) 20 MG capsule, and traZODone (DESYREL) 50 MG tablet. He states that he is now completely out and they weren't sent in. He would like 3 month refills sent to [facility] on [address], in [place]. Please assist.

## 2012-10-23 ENCOUNTER — Encounter: Payer: Self-pay | Admitting: Gastroenterology

## 2012-10-31 ENCOUNTER — Other Ambulatory Visit (INDEPENDENT_AMBULATORY_CARE_PROVIDER_SITE_OTHER): Payer: Medicare Other

## 2012-10-31 DIAGNOSIS — I10 Essential (primary) hypertension: Secondary | ICD-10-CM

## 2012-10-31 LAB — BASIC METABOLIC PANEL
CO2: 28 mEq/L (ref 19–32)
Calcium: 9 mg/dL (ref 8.4–10.5)
Creatinine, Ser: 1 mg/dL (ref 0.4–1.5)
GFR: 76.13 mL/min (ref >60.00)
Glucose, Bld: 86 mg/dL (ref 70–99)
Sodium: 141 mEq/L (ref 135–145)

## 2012-12-18 ENCOUNTER — Encounter: Payer: Self-pay | Admitting: Gastroenterology

## 2012-12-18 ENCOUNTER — Ambulatory Visit (AMBULATORY_SURGERY_CENTER): Payer: Medicare Other | Admitting: *Deleted

## 2012-12-18 VITALS — Ht 69.5 in | Wt 217.6 lb

## 2012-12-18 DIAGNOSIS — D126 Benign neoplasm of colon, unspecified: Secondary | ICD-10-CM

## 2012-12-18 DIAGNOSIS — Z800 Family history of malignant neoplasm of digestive organs: Secondary | ICD-10-CM

## 2012-12-18 MED ORDER — NA SULFATE-K SULFATE-MG SULF 17.5-3.13-1.6 GM/177ML PO SOLN: ORAL | Status: DC

## 2012-12-18 NOTE — Progress Notes (Signed)
No egg or soy allergies 

## 2012-12-18 NOTE — Progress Notes (Signed)
Called to room to assist during endoscopic procedure.  Patient ID and intended procedure confirmed with present staff. Received instructions for my participation in the procedure from the performing physician.  

## 2013-01-01 ENCOUNTER — Encounter: Payer: Self-pay | Admitting: Gastroenterology

## 2013-01-01 ENCOUNTER — Ambulatory Visit (AMBULATORY_SURGERY_CENTER): Payer: Medicare Other | Admitting: Gastroenterology

## 2013-01-01 VITALS — BP 132/62 | HR 71 | Temp 98.1°F | Resp 24 | Ht 69.0 in | Wt 217.0 lb

## 2013-01-01 DIAGNOSIS — Z1211 Encounter for screening for malignant neoplasm of colon: Secondary | ICD-10-CM

## 2013-01-01 DIAGNOSIS — K5730 Diverticulosis of large intestine without perforation or abscess without bleeding: Secondary | ICD-10-CM

## 2013-01-01 DIAGNOSIS — D126 Benign neoplasm of colon, unspecified: Secondary | ICD-10-CM

## 2013-01-01 DIAGNOSIS — Z800 Family history of malignant neoplasm of digestive organs: Secondary | ICD-10-CM

## 2013-01-01 MED ORDER — SODIUM CHLORIDE 0.9 % IV SOLN: 500.0000 mL | INTRAVENOUS | Status: DC

## 2013-01-01 NOTE — Progress Notes (Signed)
Patient spit up small amount of bile tinged secretions, spont respirtations maintained throughtout, oral-pharynx suctioned, protective reflexives intact.  Dr Arlyce Dice informed.  VSS. O2Sat Maintained 96-100% Marrion Coy CRNA

## 2013-01-01 NOTE — Patient Instructions (Signed)
Discharge instructions given with verbal understanding. Handouts on polyps and diverticulosis. Resume previous medications. YOU HAD AN ENDOSCOPIC PROCEDURE TODAY AT THE [facility]: Refer to the procedure report that was given to you for any specific questions about what was found during the examination.  If the procedure report does not answer your questions, please call your gastroenterologist to clarify.  If you requested that your care partner not be given the details of your procedure findings, then the procedure report has been included in a sealed envelope for you to review at your convenience later.  YOU SHOULD EXPECT: Some feelings of bloating in the abdomen. Passage of more gas than usual.  Walking can help get rid of the air that was put into your GI tract during the procedure and reduce the bloating. If you had a lower endoscopy (such as a colonoscopy or flexible sigmoidoscopy) you may notice spotting of blood in your stool or on the toilet paper. If you underwent a bowel prep for your procedure, then you may not have a normal bowel movement for a few days.  DIET: Your first meal following the procedure should be a light meal and then it is ok to progress to your normal diet.  A half-sandwich or bowl of soup is an example of a good first meal.  Heavy or fried foods are harder to digest and may make you feel nauseous or bloated.  Likewise meals heavy in dairy and vegetables can cause extra gas to form and this can also increase the bloating.  Drink plenty of fluids but you should avoid alcoholic beverages for 24 hours.  ACTIVITY: Your care partner should take you home directly after the procedure.  You should plan to take it easy, moving slowly for the rest of the day.  You can resume normal activity the day after the procedure however you should NOT DRIVE or use heavy machinery for 24 hours (because of the sedation medicines used during the test).    SYMPTOMS TO REPORT  IMMEDIATELY: A gastroenterologist can be reached at any hour.  During normal business hours, 8:30 AM to 5:00 PM Monday through Friday, call [phone].  After hours and on weekends, please call the GI answering service at [phone] who will take a message and have the physician on call contact you.   Following lower endoscopy (colonoscopy or flexible sigmoidoscopy):  Excessive amounts of blood in the stool  Significant tenderness or worsening of abdominal pains  Swelling of the abdomen that is new, acute  Fever of 100F or higher  FOLLOW UP: If any biopsies were taken you will be contacted by phone or by letter within the next 1-3 weeks.  Call your gastroenterologist if you have not heard about the biopsies in 3 weeks.  Our staff will call the home number listed on your records the next business day following your procedure to check on you and address any questions or concerns that you may have at that time regarding the information given to you following your procedure. This is a courtesy call and so if there is no answer at the home number and we have not heard from you through the emergency physician on call, we will assume that you have returned to your regular daily activities without incident.  SIGNATURES/CONFIDENTIALITY: You and/or your care partner have signed paperwork which will be entered into your electronic medical record.  These signatures attest to the fact that that the information above on your After Visit Summary   has been reviewed and is understood.  Full responsibility of the confidentiality of this discharge information lies with you and/or your care-partner. 

## 2013-01-01 NOTE — Progress Notes (Signed)
Called to room to assist during endoscopic procedure.  Patient ID and intended procedure confirmed with present staff. Received instructions for my participation in the procedure from the performing physician.  

## 2013-01-01 NOTE — Progress Notes (Signed)
Patient did not experience any of the following events: a burn prior to discharge; a fall within the facility; wrong site/side/patient/procedure/implant event; or a hospital transfer or hospital admission upon discharge from the facility. (G8907) Patient did not have preoperative order for IV antibiotic SSI prophylaxis. (G8918)  

## 2013-01-01 NOTE — Op Note (Signed)
[facility] [address] [place], [address]   COLONOSCOPY PROCEDURE REPORT  PATIENT: Travis, Schwartz  MR#: [id number] BIRTHDATE: [birth year] , 71  yrs. old GENDER: Male ENDOSCOPIST: Louis Meckel, MD REFERRED BY: PROCEDURE DATE:  01/01/2013 PROCEDURE:   Colonoscopy with snare polypectomy and Colonoscopy with cold biopsy polypectomy First Screening Colonoscopy - Avg.  risk and is 50 yrs.  old or older - No.  Prior Negative Screening - Now for repeat screening. Above average risk  History of Adenoma - Now for follow-up colonoscopy & has been > or = to 3 yrs.  N/A  Polyps Removed Today? Yes. ASA CLASS:   Class II INDICATIONS:Patient's immediate family history of colon cancer. MEDICATIONS: MAC sedation, administered by CRNA and propofol (Diprivan) 250mg  IV  DESCRIPTION OF PROCEDURE:   After the risks benefits and alternatives of the procedure were thoroughly explained, informed consent was obtained.  A digital rectal exam revealed no abnormalities of the rectum.   The LB [id number]  endoscope was introduced through the anus and advanced to the cecum, which was identified by both the appendix and ileocecal valve. No adverse events experienced.   The quality of the prep was Suprep good  The instrument was then slowly withdrawn as the colon was fully examined.      COLON FINDINGS: A sessile polyp measuring 3 mm in size was found at the cecum.  A polypectomy was performed with a cold snare.  The resection was complete and the polyp tissue was completely retrieved.   A flat polyp measuring 2 mm in size was found at the cecum.  A polypectomy was performed with cold forceps.   A flat polyp measuring 2 mm in size was found in the transverse colon.  A polypectomy was performed with cold forceps.   Mild diverticulosis was noted in the sigmoid colon.   The colon mucosa was otherwise normal.  Retroflexed views revealed no abnormalities. The time to cecum=4  minutes 38 seconds.  Withdrawal time=17 minutes 28 seconds. The scope was withdrawn and the procedure completed. COMPLICATIONS: There were no complications.  ENDOSCOPIC IMPRESSION: 1.   Sessile polyp measuring 3 mm in size was found at the cecum; polypectomy was performed with a cold snare 2.   Flat polyp measuring 2 mm in size was found at the cecum; polypectomy was performed with cold forceps 3.   Flat polyp measuring 2 mm in size was found in the transverse colon; polypectomy was performed with cold forceps 4.   Mild diverticulosis was noted in the sigmoid colon 5.   The colon mucosa was otherwise normal  RECOMMENDATIONS: Given your significant family history of colon cancer, you should have a repeat colonoscopy in 5 years   eSigned:  Louis Meckel, MD 01/01/2013 10:46 AM   cc: Roderick Pee, MD and Ancil Boozer MD   PATIENT NAME:  Travis, Schwartz MR#: [id number]

## 2013-01-02 ENCOUNTER — Telehealth: Payer: Self-pay

## 2013-01-02 NOTE — Telephone Encounter (Signed)
  Follow up Call-  Call back number 01/01/2013  Post procedure Call Back phone  # [phone]  Permission to leave phone message Yes     Patient questions:  Do you have a fever, pain , or abdominal swelling? no Pain Score  0 *  Have you tolerated food without any problems? yes  Have you been able to return to your normal activities? yes  Do you have any questions about your discharge instructions: Diet   no Medications  no Follow up visit  no  Do you have questions or concerns about your Care? no  Actions: * If pain score is 4 or above: No action needed, pain <4.

## 2013-01-08 ENCOUNTER — Encounter: Payer: Self-pay | Admitting: Gastroenterology

## 2013-08-29 ENCOUNTER — Ambulatory Visit (INDEPENDENT_AMBULATORY_CARE_PROVIDER_SITE_OTHER): Payer: Medicare Other | Admitting: Family Medicine

## 2013-08-29 ENCOUNTER — Encounter: Payer: Self-pay | Admitting: Family Medicine

## 2013-08-29 VITALS — BP 110/80 | HR 98 | Temp 98.6°F | Wt 228.0 lb

## 2013-08-29 DIAGNOSIS — K219 Gastro-esophageal reflux disease without esophagitis: Secondary | ICD-10-CM

## 2013-08-29 DIAGNOSIS — R0989 Other specified symptoms and signs involving the circulatory and respiratory systems: Secondary | ICD-10-CM

## 2013-08-29 DIAGNOSIS — J989 Respiratory disorder, unspecified: Secondary | ICD-10-CM | POA: Insufficient documentation

## 2013-08-29 MED ORDER — PREDNISONE 20 MG PO TABS: ORAL_TABLET | ORAL | Status: DC

## 2013-08-29 MED ORDER — HYDROCODONE-HOMATROPINE 5-1.5 MG/5ML PO SYRP: 5.0000 mL | ORAL_SOLUTION | Freq: Three times a day (TID) | ORAL | Status: DC | PRN

## 2013-08-29 NOTE — Progress Notes (Signed)
Pre visit review using our clinic review tool, if applicable. No additional management support is needed unless otherwise documented below in the visit note. 

## 2013-08-29 NOTE — Patient Instructions (Signed)
Stop the Prilosec  OTC Nexium,,,,,,,,,,,, one twice daily  Prednisone 20 mg,,,,,,,,, 2 tabs x3 days then taper as outlined  Hydromet,,,,,,,,,,, 1/2-1 teaspoon 3 times daily. For cough  Return next Tuesday for followup  Entire reflux measures,,,,,,,, nothing to eat or drink for 3 hours before bedtime and sleep on 2 pillows

## 2013-08-29 NOTE — Progress Notes (Signed)
   Subjective:    Patient ID: Travis Schwartz, male    DOB: 02-10-1941, 73 y.o.   MRN: [id number]  HPI  Travis Schwartz is a 73 year old male married nonsmoker who comes in today for evaluation of reflux esophagitis  He states 3 weeks ago he had a bad episode of reflux and he had a lot of coughing for about 3 hours. He's been coughing so hard his chest hurts and now he feels like he is wheezing.  He takes omeprazole 20 mg daily. Last year we sent her for upper endoscopy with Dr. Deatra Ina. They did not find any esophageal lesions. 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

## 2013-09-03 ENCOUNTER — Encounter: Payer: Self-pay | Admitting: Family Medicine

## 2013-09-03 ENCOUNTER — Ambulatory Visit (INDEPENDENT_AMBULATORY_CARE_PROVIDER_SITE_OTHER): Payer: Medicare Other | Admitting: Family Medicine

## 2013-09-03 VITALS — BP 130/80 | Temp 97.9°F | Wt 226.0 lb

## 2013-09-03 DIAGNOSIS — J989 Respiratory disorder, unspecified: Secondary | ICD-10-CM

## 2013-09-03 DIAGNOSIS — R0989 Other specified symptoms and signs involving the circulatory and respiratory systems: Secondary | ICD-10-CM

## 2013-09-03 DIAGNOSIS — K219 Gastro-esophageal reflux disease without esophagitis: Secondary | ICD-10-CM

## 2013-09-03 NOTE — Progress Notes (Signed)
Pre visit review using our clinic review tool, if applicable. No additional management support is needed unless otherwise documented below in the visit note. 

## 2013-09-03 NOTE — Patient Instructions (Signed)
Continue the omeprazole one twice daily for another month then decrease to 1 daily in the morning........... if this does not work and your symptoms recur then go back to one twice daily  If the Prilosec does not relieve her reflux then I would recommend a consult with your gastroenterologist  Taper the prednisone as outlined  Return when necessary

## 2013-09-03 NOTE — Progress Notes (Signed)
   Subjective:    Patient ID: Travis Schwartz, male    DOB: 02-24-1941, 73 y.o.   MRN: [id number]  HPI Gerrald is a 73 year old married male nonsmoker who comes in today for followup of a cough  His medicines previous study a severe bout of reflux esophagitis which triggered some coughing and wheezing. We started him on Prilosec twice a day cough syrup and prednisone 40 mg daily. Today is his last day of 20 mg daily and he feels back to normal. No coughing no wheezing no reflux   Review of Systems    review of systems otherwise negative Objective:   Physical Exam  Well-developed and nourished male no acute distress vital signs stable he is afebrile HEENT negative neck was supple no adenopathy lungs are clear no wheezing today      Assessment & Plan:  GERD with secondary reactive airway disease resolve with above therapy.......... taper prednisone........... taper Prilosec down to one a day.

## 2013-11-16 ENCOUNTER — Other Ambulatory Visit: Payer: Self-pay | Admitting: Family Medicine

## 2013-12-02 ENCOUNTER — Encounter: Payer: Self-pay | Admitting: Family Medicine

## 2013-12-02 ENCOUNTER — Ambulatory Visit (INDEPENDENT_AMBULATORY_CARE_PROVIDER_SITE_OTHER): Payer: Medicare Other | Admitting: Family Medicine

## 2013-12-02 ENCOUNTER — Telehealth: Payer: Self-pay | Admitting: Family Medicine

## 2013-12-02 VITALS — BP 112/80 | HR 95 | Temp 97.4°F | Ht 69.0 in | Wt 210.5 lb

## 2013-12-02 DIAGNOSIS — B029 Zoster without complications: Secondary | ICD-10-CM

## 2013-12-02 MED ORDER — ACYCLOVIR 400 MG PO TABS: ORAL_TABLET | ORAL | Status: DC

## 2013-12-02 MED ORDER — TRAMADOL HCL 50 MG PO TABS: ORAL_TABLET | ORAL | Status: DC

## 2013-12-02 NOTE — Telephone Encounter (Signed)
Pt seen in office

## 2013-12-02 NOTE — Telephone Encounter (Signed)
Pt states he broke out w/ shingles on Sat. Had the shot so its not too bad. Pt has a cpe on tues. Wants to know if he should resc appt or ok to come on in?

## 2013-12-02 NOTE — Patient Instructions (Addendum)
Stop the antibiotics  Acyclovir......Marland Kitchen 800 mg.......... 5 times daily  Followup in one week ......  Tramadol 50 mg.......... one half to one tablet 3 times daily when necessary for pain.

## 2013-12-02 NOTE — Progress Notes (Signed)
   Subjective:    Patient ID: Travis Schwartz, male    DOB: May 05, 1940, 73 y.o.   MRN: [id number]  HPI Travis Schwartz is a 73 year old male who comes in today for followup of acute shingles  He developed shingles last week involving his left chest. The rash has extended to the left back but not across the midline. No facial involvement  He went to [place] [facility] over the weekend and was given acyclovir your 1 g 3 times daily and also antibiotics??????????  He comes in today for followup saying his pain was a 7 now is down to a 3 or 4   Review of Systems Review of systems otherwise negative    Objective:   Physical Exam  Well-developed well-nourished male in no acute distress vital signs stable he is afebrile examination skin shows a classic shingles from the left nipple raiding to the left scapular area. Does not across the midline  He had a shingles vaccine 2 years ago      Assessment & Plan:  Acute shingles............... acyclovir 800 mg 5 times daily followup in one week.

## 2013-12-02 NOTE — Progress Notes (Signed)
Pre visit review using our clinic review tool, if applicable. No additional management support is needed unless otherwise documented below in the visit note. 

## 2013-12-03 ENCOUNTER — Encounter: Payer: Self-pay | Admitting: Family Medicine

## 2013-12-03 ENCOUNTER — Ambulatory Visit (INDEPENDENT_AMBULATORY_CARE_PROVIDER_SITE_OTHER): Payer: Medicare Other | Admitting: Family Medicine

## 2013-12-03 VITALS — BP 120/84 | Temp 97.6°F | Ht 69.25 in | Wt 219.0 lb

## 2013-12-03 DIAGNOSIS — Z8546 Personal history of malignant neoplasm of prostate: Secondary | ICD-10-CM

## 2013-12-03 DIAGNOSIS — F528 Other sexual dysfunction not due to a substance or known physiological condition: Secondary | ICD-10-CM

## 2013-12-03 DIAGNOSIS — K2100 Gastro-esophageal reflux disease with esophagitis, without bleeding: Secondary | ICD-10-CM

## 2013-12-03 DIAGNOSIS — J309 Allergic rhinitis, unspecified: Secondary | ICD-10-CM

## 2013-12-03 DIAGNOSIS — R1310 Dysphagia, unspecified: Secondary | ICD-10-CM

## 2013-12-03 DIAGNOSIS — E785 Hyperlipidemia, unspecified: Secondary | ICD-10-CM

## 2013-12-03 LAB — TSH: TSH: 4.84 u[IU]/mL — ABNORMAL HIGH (ref 0.35–4.50)

## 2013-12-03 LAB — HEPATIC FUNCTION PANEL
ALT: 41 U/L (ref 0–53)
AST: 28 U/L (ref 0–37)
Albumin: 4.2 g/dL (ref 3.5–5.2)
Alkaline Phosphatase: 84 U/L (ref 39–117)
Bilirubin, Direct: 0.2 mg/dL (ref 0.0–0.3)
TOTAL PROTEIN: 6.7 g/dL (ref 6.0–8.3)
Total Bilirubin: 0.9 mg/dL (ref 0.2–1.2)

## 2013-12-03 LAB — BASIC METABOLIC PANEL
BUN: 23 mg/dL (ref 6–23)
CO2: 24 mEq/L (ref 19–32)
CREATININE: 1.2 mg/dL (ref 0.4–1.5)
Calcium: 9.6 mg/dL (ref 8.4–10.5)
Chloride: 105 mEq/L (ref 96–112)
GFR: 64.16 mL/min (ref >60.00)
Glucose, Bld: 94 mg/dL (ref 70–99)
POTASSIUM: 5.1 meq/L (ref 3.5–5.1)
Sodium: 140 mEq/L (ref 135–145)

## 2013-12-03 LAB — LIPID PANEL
Cholesterol: 139 mg/dL (ref 0–200)
HDL: 28.5 mg/dL — AB (ref >39.00)
LDL Cholesterol: 82 mg/dL (ref 0–99)
NonHDL: 110.5
Total CHOL/HDL Ratio: 5
Triglycerides: 145 mg/dL (ref 0.0–149.0)
VLDL: 29 mg/dL (ref 0.0–40.0)

## 2013-12-03 LAB — POCT URINALYSIS DIPSTICK
Blood, UA: NEGATIVE
Glucose, UA: NEGATIVE
Ketones, UA: NEGATIVE
LEUKOCYTES UA: NEGATIVE
NITRITE UA: NEGATIVE
Protein, UA: NEGATIVE
Spec Grav, UA: 1.02
Urobilinogen, UA: 1
pH, UA: 5.5

## 2013-12-03 LAB — PSA: PSA: 0.07 ng/mL — ABNORMAL LOW (ref 0.10–4.00)

## 2013-12-03 MED ORDER — ATORVASTATIN CALCIUM 40 MG PO TABS: ORAL_TABLET | ORAL | Status: DC

## 2013-12-03 MED ORDER — OMEPRAZOLE 20 MG PO CPDR: DELAYED_RELEASE_CAPSULE | ORAL | Status: DC

## 2013-12-03 NOTE — Progress Notes (Signed)
Pre visit review using our clinic review tool, if applicable. No additional management support is needed unless otherwise documented below in the visit note. 

## 2013-12-03 NOTE — Progress Notes (Signed)
   Subjective:    Patient ID: Travis Schwartz, male    DOB: [birth year], 73 y.o.   MRN: [id number]  HPI Travis Schwartz is a 73 year old male who comes in today for evaluation of hyperlipidemia, reflux esophagitis and shingles  We saw him yesterday and start him on acyclovir 800 mg 5 times daily. He had been to an urgent care over the weekend was given Valtrex, antibiotics for unknown reasons, Lidoderm patches which he never got filled because they were over $200  He seems better today the pain is diminishing  He takes Lipitor 40 mg daily for hyperlipidemia  He also takes Prilosec 20 mg daily for reflux chronic is  He gets routine eye care, dental care, colonoscopy and GI, vaccinations on hold because he has acute shingles. He did had the shingles vaccine  Cognitive function normal he walks daily home health safety reviewed no issues identified, no guns in the house, he states he does have a health care power of attorney and living well   Review of Systems  Constitutional: Negative.   HENT: Negative.   Eyes: Negative.   Respiratory: Negative.   Cardiovascular: Negative.   Gastrointestinal: Negative.   Genitourinary: Negative.   Musculoskeletal: Negative.   Skin: Negative.   Neurological: Negative.   Psychiatric/Behavioral: Negative.        Objective:   Physical Exam  Nursing note and vitals reviewed. Constitutional: He is oriented to person, place, and time. He appears well-developed and well-nourished.  HENT:  Head: Normocephalic and atraumatic.  Right Ear: External ear normal.  Left Ear: External ear normal.  Nose: Nose normal.  Mouth/Throat: Oropharynx is clear and moist.  Eyes: Conjunctivae and EOM are normal. Pupils are equal, round, and reactive to light.  Neck: Normal range of motion. Neck supple. No JVD present. No tracheal deviation present. No thyromegaly present.  Cardiovascular: Normal rate, regular rhythm, normal heart sounds and intact distal pulses.  Exam reveals no  gallop and no friction rub.   No murmur heard. No carotid Merrick bracer for pulses 2+ and symmetrical  Pulmonary/Chest: Effort normal and breath sounds normal. No stridor. No respiratory distress. He has no wheezes. He has no rales. He exhibits no tenderness.  Abdominal: Soft. Bowel sounds are normal. He exhibits no distension and no mass. There is no tenderness. There is no rebound and no guarding.  Genitourinary:  Penis and testicles normal rectal deferred,,,,,,,, done by urologist yearly  Musculoskeletal: Normal range of motion. He exhibits no edema and no tenderness.  Lymphadenopathy:    He has no cervical adenopathy.  Neurological: He is alert and oriented to person, place, and time. He has normal reflexes. No cranial nerve deficit. He exhibits normal muscle tone.  Skin: Skin is warm and dry. No rash noted. No erythema. No pallor.  Resolving rash of shingles no secondary infection antibiotics were discontinued because there is no reason to be on antibiotics  Psychiatric: He has a normal mood and affect. His behavior is normal. Judgment and thought content normal.          Assessment & Plan:  Shingles resolving continue acyclovir  Hyperlipidemia check labs  Reflux esophagitis continue Prilosec  History prostate cancer........ status post surgical removal.....Marland Kitchen followed by urology

## 2013-12-03 NOTE — Patient Instructions (Signed)
6 continue current medications  Labs today  Acyclovir 800 mg 5 times daily into the rash is completely gone  Return when necessary

## 2013-12-04 LAB — CBC WITH DIFFERENTIAL/PLATELET
BASOS ABS: 0 10*3/uL (ref 0.0–0.1)
Basophils Relative: 0.6 % (ref 0.0–3.0)
Eosinophils Absolute: 0.1 10*3/uL (ref 0.0–0.7)
Eosinophils Relative: 1.2 % (ref 0.0–5.0)
HEMATOCRIT: 44.7 % (ref 39.0–52.0)
Hemoglobin: 15.1 g/dL (ref 13.0–17.0)
LYMPHS ABS: 1.5 10*3/uL (ref 0.7–4.0)
Lymphocytes Relative: 22.4 % (ref 12.0–46.0)
MCHC: 33.9 g/dL (ref 30.0–36.0)
MCV: 96 fl (ref 78.0–100.0)
MONOS PCT: 4 % (ref 3.0–12.0)
Monocytes Absolute: 0.3 10*3/uL (ref 0.1–1.0)
NEUTROS PCT: 71.8 % (ref 43.0–77.0)
Neutro Abs: 4.7 10*3/uL (ref 1.4–7.7)
PLATELETS: 274 10*3/uL (ref 150.0–400.0)
RBC: 4.65 Mil/uL (ref 4.22–5.81)
RDW: 14.7 % (ref 11.5–15.5)
WBC: 6.6 10*3/uL (ref 4.0–10.5)

## 2013-12-05 ENCOUNTER — Telehealth: Payer: Self-pay | Admitting: Family Medicine

## 2013-12-05 MED ORDER — GABAPENTIN 300 MG PO CAPS: 300.0000 mg | ORAL_CAPSULE | Freq: Three times a day (TID) | ORAL | Status: DC

## 2013-12-05 NOTE — Telephone Encounter (Signed)
Please call pt on cell [phone]

## 2013-12-05 NOTE — Telephone Encounter (Signed)
Gabapentin 300 tid per Dr Sherren Mocha. Patient is aware and Rx sent.

## 2013-12-05 NOTE — Telephone Encounter (Signed)
Pt states the traMADol (ULTRAM) 50 MG tablet  Is not strong enough to take his pain away. Would like something stronger. [facility]

## 2013-12-09 ENCOUNTER — Ambulatory Visit: Payer: Medicare Other | Admitting: Family Medicine

## 2013-12-10 ENCOUNTER — Ambulatory Visit: Payer: Medicare Other | Admitting: Family Medicine

## 2013-12-11 ENCOUNTER — Ambulatory Visit (INDEPENDENT_AMBULATORY_CARE_PROVIDER_SITE_OTHER): Payer: Medicare Other | Admitting: Family Medicine

## 2013-12-11 ENCOUNTER — Encounter: Payer: Self-pay | Admitting: Family Medicine

## 2013-12-11 VITALS — BP 120/80 | Temp 97.6°F | Wt 233.0 lb

## 2013-12-11 DIAGNOSIS — B029 Zoster without complications: Secondary | ICD-10-CM

## 2013-12-11 MED ORDER — ACYCLOVIR 400 MG PO TABS: ORAL_TABLET | ORAL | Status: DC

## 2013-12-11 NOTE — Progress Notes (Signed)
   Subjective:    Patient ID: Travis Schwartz, male    DOB: 1940/12/18, 73 y.o.   MRN: [id number]  HPI Travis Schwartz is a 73 year old male who comes in today for followup of a severe outbreak of shingles with secondary post shingles neuropathy  We started him on Neurontin 300 mg 3 times a day a week ago his pain is minor 2-3 and he was able to stop the tramadol. The rash is going but not completely gone. He has no new active lesions   Review of Systems    review of systems otherwise negative Objective:   Physical Exam Well-developed and nourished male no acute distress examination chest shows erythema from the anterior portion of the chest down to the lateral portion but does not cross the midline of the back. No active ulcers       Assessment & Plan:  Shingles slowly resolving with neuropathy............ continue Neurontin.....Marland Kitchen continue acyclovir followup in 4 weeks.Marland Kitchen

## 2013-12-11 NOTE — Progress Notes (Signed)
Pre visit review using our clinic review tool, if applicable. No additional management support is needed unless otherwise documented below in the visit note. 

## 2013-12-11 NOTE — Patient Instructions (Signed)
Acyclovir.............. 2 tabs 5 times daily  Neurontin..........Marland Kitchen 13 times daily  Return in one month for followup.Marland KitchenMarland Kitchen

## 2013-12-16 ENCOUNTER — Telehealth: Payer: Self-pay | Admitting: Family Medicine

## 2013-12-16 DIAGNOSIS — G629 Polyneuropathy, unspecified: Secondary | ICD-10-CM

## 2013-12-16 NOTE — Telephone Encounter (Signed)
Patient should go to neurology per Dr Sherren Mocha.  Patient is aware and referral placed.

## 2013-12-16 NOTE — Telephone Encounter (Signed)
Pt states the med given for his shingles pain is not working. Need something else. pls advise.

## 2013-12-26 ENCOUNTER — Encounter: Payer: Self-pay | Admitting: Neurology

## 2013-12-26 ENCOUNTER — Ambulatory Visit (INDEPENDENT_AMBULATORY_CARE_PROVIDER_SITE_OTHER): Payer: Medicare Other | Admitting: Neurology

## 2013-12-26 VITALS — BP 128/70 | HR 99 | Ht 69.0 in | Wt 218.6 lb

## 2013-12-26 DIAGNOSIS — M792 Neuralgia and neuritis, unspecified: Secondary | ICD-10-CM

## 2013-12-26 DIAGNOSIS — B029 Zoster without complications: Secondary | ICD-10-CM

## 2013-12-26 MED ORDER — LIDOCAINE 5 % EX OINT: TOPICAL_OINTMENT | CUTANEOUS | Status: DC

## 2013-12-26 NOTE — Patient Instructions (Addendum)
1.  Start taking neurontin as follows:    AM  PM  Week 1   600mg  (2 tab)  Week 2 300mg   600mg  (2 tab)  2.  Start using lidocaine ointment to chest twice daily as needed.  Apply with gloves.  3.  Stop taking acyclovir  4.  Return to clinic in 2-3 months

## 2013-12-26 NOTE — Progress Notes (Signed)
[facility] Clinic Note - Initial Visit   Date: 12/26/2013  Travis Schwartz MRN: [id number] DOB: 1940/05/23   Dear Dr. Sherren Mocha:  Thank you for your kind referral of Travis Schwartz for consultation of neuropathic pain. Although his history is well known to you, please allow [place] to reiterate it for the purpose of our medical record. The patient was accompanied to the clinic by self.    History of Present Illness: Travis Schwartz is a 73 y.o. right-handed Caucasian male with history of hyperlipidemia, prostate cancer s/p resection (1992), hyperlipidemia, and GERD presenting for evaluation of neuropathic pain due to recent shingles infection.    Starting in early September, he developed severe chest pain and initially thought he pulled a muscle because he was helping his brother do some outdoor work.  Soon after, he developed a erythematous rash over the left chest wall and under his arm.  He went to his PCP and was diagnosed with shingles for which he was given acyclovir and neurontin.  Pain is described as stabbing and sharp over the chest and itching sensation over there arm and chest. Pain is worse at night. Light pressure exacerbates his pain.  He has tried taking neurontin 300mg  TID, ultram, and lidocaine patch with modest benefit.    He does report to having shingles vaccination 3 years ago.   Past Medical History  Diagnosis Date  . Arthritis   . Hyperlipidemia   . Cancer     prostate  . GERD (gastroesophageal reflux disease)   . Diverticulosis     2008  . Allergy     no per pt  . Hiatal hernia     Past Surgical History  Procedure Laterality Date  . Transurethral resection of prostate    . Tonsillectomy    . Cholecystectomy    . Cataract extraction      both eyes  . Colonoscopy    . Upper gastrointestinal endoscopy       Medications:  Current Outpatient Prescriptions on File Prior to Visit  Medication Sig Dispense Refill  . acyclovir  (ZOVIRAX) 400 MG tablet 2 tabs 5 times daily  100 tablet  1  . atorvastatin (LIPITOR) 40 MG tablet TAKE 1 TABLET BY MOUTH DAILY  100 tablet  3  . gabapentin (NEURONTIN) 300 MG capsule Take 1 capsule (300 mg total) by mouth 3 (three) times daily.  100 capsule  3  . omeprazole (PRILOSEC) 20 MG capsule TAKE 1 CAPSULE BY MOUTH DAILY  100 capsule  3  . traMADol (ULTRAM) 50 MG tablet One half to one tablet 3 times daily for pain  40 tablet  1   No current facility-administered medications on file prior to visit.    Allergies: No Known Allergies  Family History: Family History  Problem Relation Age of Onset  . Cancer Sister     uterine  . Hyperlipidemia Other   . COPD Other     colon  . Colon cancer Mother   . Esophageal cancer Neg Hx   . Rectal cancer Neg Hx   . Stomach cancer Neg Hx     Social History: History   Social History  . Marital Status: Married    Spouse Name: N/A    Number of Children: N/A  . Years of Education: N/A   Occupational History  . Not on file.   Social History Main Topics  . Smoking status: Former Smoker -- 2.00 packs/day for 3 years  Types: Cigarettes    Quit date: 03/22/1963  . Smokeless tobacco: Former Systems developer  . Alcohol Use: Yes     Comment: not recently  . Drug Use: No  . Sexual Activity: Not on file   Other Topics Concern  . Not on file   Social History Narrative   Lives with wife in a 2 story home.  1 child.  Worked as a Administrator for 30 years.  High school education.    Review of Systems:  CONSTITUTIONAL: No fevers, chills, night sweats, or weight loss.   EYES: No visual changes or eye pain ENT: No hearing changes.  No history of nose bleeds.   RESPIRATORY: No cough, wheezing and shortness of breath.   CARDIOVASCULAR: Negative for chest pain, and palpitations.   GI: Negative for abdominal discomfort, blood in stools or black stools.  No recent change in bowel habits.   GU:  No history of incontinence.   MUSCLOSKELETAL: +history  of joint pain or swelling.  No myalgias.   SKIN: + lesions, +rash, +itching.   HEMATOLOGY/ONCOLOGY: Negative for prolonged bleeding, bruising easily, and swollen nodes.  +history of cancer.   ENDOCRINE: Negative for cold or heat intolerance, polydipsia or goiter.   PSYCH:  No depression or anxiety symptoms.   NEURO: As Above.   Vital Signs:  BP 128/70  Pulse 99  Ht 5\' 9"  (1.753 m)  Wt 218 lb 9 oz (99.139 kg)  BMI 32.26 kg/m2  SpO2 95%   General Medical Exam:   General:  Well appearing, comfortable.   Eyes/ENT: see cranial nerve examination.   Neck: No masses appreciated.  Full range of motion without tenderness.  No carotid bruits. Respiratory:  Clear to auscultation, good air entry bilaterally.   Cardiac:  Regular rate and rhythm, no murmur.   Extremities:  No deformities, edema, or skin discoloration. Good capillary refill.   Skin:  Erythema over the anterior left chest wall along T3 dermatome with remnants of old skin lesions.  Neurological Exam: MENTAL STATUS including orientation to time, place, person, recent and remote memory, attention span and concentration, language, and fund of knowledge is normal.  Speech is not dysarthric.  CRANIAL NERVES: II:  No visual field defects.  Unremarkable fundi.   III-IV-VI: Pupils equal round and reactive to light.  Normal conjugate, extra-ocular eye movements in all directions of gaze.  No nystagmus.     V:  Normal facial sensation.   VII:  Normal facial symmetry and movements.   VIII:  Normal hearing and vestibular function.   IX-X:  Normal palatal movement.   XI:  Normal shoulder shrug and head rotation.   XII:  Normal tongue strength and range of motion, no deviation or fasciculation.  MOTOR:  Motor strength is 5/5 in all extremities.  No atrophy, fasciculations or abnormal movements.  No pronator drift.  Tone is normal.    MSRs:  Right                                                                 Left brachioradialis 2+   brachioradialis 2+  biceps 2+  biceps 2+  triceps 2+  triceps 2+  patellar 2+  patellar 2+  ankle jerk 1+  ankle jerk 1+  Hoffman no  Hoffman no  plantar response down  plantar response down   SENSORY:  Hyperesthesia to pin prick over the left T3 anterior dermatome.  Vibration is mildly reduced at the great toe bilaterally, otherwise sensation intact.  COORDINATION/GAIT: Normal finger-to- nose-finger and heel-to-shin.  Intact rapid alternating movements bilaterally.   Gait narrow based and stable.   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  Exam shows healed lesions with mild erythema over the left anterior chest wall and hyperesthesia to temperature and pin prick.  The remainder of his neurological exam is normal except for the loss of ankle jerks distally, which is likely age-related.  His initial shingles infection occurred in early September and he was started on acyclovir and neurontin.  There is no data to support extended acyclovir treatment, so will recommend stopping it especially since skin lesions are healed.  Goal will be to optimize pain control by titrating neurontin.  Going forward, consider adding amitriptyline.   PLAN/RECOMMENDATIONS:  1.  Start taking neurontin as follows:    AM  PM  Week 1   600mg  (2 tab)  Week 2 300mg   600mg  (2 tab)  2.  Start using lidocaine ointment to chest twice daily as needed.  Apply with gloves.  3.  Stop acyclovir  4.  Return to clinic in 2-3 months   The duration of this appointment visit was 40 minutes of face-to-face time with the patient.  Greater than 50% of this time was spent in counseling, explanation of diagnosis, planning of further management, and coordination of care.   Thank you for allowing me to  participate in patient's care.  If I can answer any additional questions, I would be pleased to do so.    Sincerely,    Donika K. Posey Pronto, DO

## 2013-12-30 ENCOUNTER — Other Ambulatory Visit: Payer: Self-pay | Admitting: *Deleted

## 2013-12-30 ENCOUNTER — Telehealth: Payer: Self-pay | Admitting: Neurology

## 2013-12-30 MED ORDER — GABAPENTIN 600 MG PO TABS: 600.0000 mg | ORAL_TABLET | Freq: Three times a day (TID) | ORAL | Status: DC

## 2013-12-30 NOTE — Telephone Encounter (Signed)
Pt called wanting Dr. Posey Pronto to Rx him something else/ stronger for his diagnosis. He states that his current meds are not working for him. C/B  [phone]

## 2013-12-30 NOTE — Telephone Encounter (Signed)
Patient given instructions and Rx sent in.  

## 2013-12-30 NOTE — Telephone Encounter (Signed)
If neurontin is not making him too sleepy, please tell him to increase to 600mg  twice daily x 3 days, then increase to 600mg  three times daily.    Verdie Wilms K. Posey Pronto, DO

## 2013-12-30 NOTE — Telephone Encounter (Signed)
This patient was seen on the 8th.  He is still on the first week of neurontin according to your last note.  Please advise.

## 2014-01-14 ENCOUNTER — Encounter: Payer: Self-pay | Admitting: Family Medicine

## 2014-01-14 ENCOUNTER — Ambulatory Visit (INDEPENDENT_AMBULATORY_CARE_PROVIDER_SITE_OTHER): Payer: Medicare Other | Admitting: Family Medicine

## 2014-01-14 VITALS — BP 130/80 | Temp 97.5°F | Wt 218.0 lb

## 2014-01-14 DIAGNOSIS — B0223 Postherpetic polyneuropathy: Secondary | ICD-10-CM

## 2014-01-14 DIAGNOSIS — Z23 Encounter for immunization: Secondary | ICD-10-CM

## 2014-01-14 MED ORDER — GABAPENTIN 600 MG PO TABS: 600.0000 mg | ORAL_TABLET | Freq: Three times a day (TID) | ORAL | Status: DC

## 2014-01-14 NOTE — Progress Notes (Signed)
Pre visit review using our clinic review tool, if applicable. No additional management support is needed unless otherwise documented below in the visit note. 

## 2014-01-14 NOTE — Patient Instructions (Addendum)
Neurontin 600 mg......... 3 times daily    Continue the lidocaine gel when necessary  Return when necessary

## 2014-01-14 NOTE — Progress Notes (Signed)
   Subjective:    Patient ID: Travis Schwartz, male    DOB: [birth year], 73 y.o.   MRN: [id number]  HPI Travis Schwartz is a 73 year old male who comes in today for follow-up of a post shingles neuropathy  Repeat treatment for shingles this summer the pain got worse the rash went away. He went to neurology they gave him 600 mg of Neurontin 3 x  daily. We had him on 300 mg 3 times daily. They also added a lidocaine gel 5% and he says his pain now was a 7 analysis 3 or 2.   Review of Systems Review of systems otherwise negative    Objective:   Physical Exam  Well-developed well-nourished man in no acute distress vital signs stable he's afebrile examination skin shows the rash is completely healed.      Assessment & Plan:  Post shingles neuropathy resolving slowly with the above therapy........ continue Neurontin 600 mg daily and lidocaine gel when necessary

## 2014-01-29 ENCOUNTER — Ambulatory Visit: Payer: Medicare Other | Admitting: Neurology

## 2014-02-24 ENCOUNTER — Other Ambulatory Visit: Payer: Self-pay | Admitting: Family Medicine

## 2014-03-11 ENCOUNTER — Ambulatory Visit: Payer: Medicare Other | Admitting: Neurology

## 2014-03-13 ENCOUNTER — Ambulatory Visit (INDEPENDENT_AMBULATORY_CARE_PROVIDER_SITE_OTHER): Payer: Medicare Other | Admitting: Neurology

## 2014-03-13 ENCOUNTER — Encounter: Payer: Self-pay | Admitting: Neurology

## 2014-03-13 ENCOUNTER — Telehealth: Payer: Self-pay | Admitting: Neurology

## 2014-03-13 VITALS — BP 128/80 | HR 98 | Ht 69.0 in | Wt 226.5 lb

## 2014-03-13 DIAGNOSIS — B0223 Postherpetic polyneuropathy: Secondary | ICD-10-CM

## 2014-03-13 DIAGNOSIS — Z79899 Other long term (current) drug therapy: Secondary | ICD-10-CM

## 2014-03-13 DIAGNOSIS — M792 Neuralgia and neuritis, unspecified: Secondary | ICD-10-CM

## 2014-03-13 MED ORDER — NORTRIPTYLINE HCL 10 MG PO CAPS: ORAL_CAPSULE | ORAL | Status: DC

## 2014-03-13 NOTE — Patient Instructions (Addendum)
1.  Reduce gabapentin to 300mg  twice daily 2.  After 3-days, start nortriptyline 10mg  at bedtime x one week, if tolerating, start taking twice daily 3.  Continue topical ointments as you are 4.  Return clinic to 6 weeks

## 2014-03-13 NOTE — Telephone Encounter (Signed)
03/11/14 marked as no show, pt called on the day of the appt to r/s. A no show letter will not be sent / Sherri S.

## 2014-03-13 NOTE — Progress Notes (Signed)
Follow-up Visit   Date: 03/13/2014    Travis Schwartz MRN: [id number] DOB: 10/04/1940   Interim History: Travis Schwartz is a 73 y.o. right-handed Caucasian male with hyperlipidemia, prostate cancer s/p resection (1992), hyperlipidemia, and GERD returning to the clinic for follow-up of postherpetic neuralgia.  The patient was accompanied to the clinic by self.  History of present illness: Starting in early September, he developed severe chest pain and initially thought he pulled a muscle because he was helping his brother do some outdoor work. Soon after, he developed a erythematous rash over the left chest wall and under his arm. He went to his PCP and was diagnosed with shingles for which he was given acyclovir and neurontin. Pain is described as stabbing and sharp over the chest and itching sensation over there arm and chest. Pain is worse at night. Light pressure exacerbates his pain. He has tried taking neurontin 300mg  TID, ultram, and lidocaine patch with modest benefit.   UPDATE 03/13/2014:  He tapered his gabapentin down to 600mg  at bedtime because of sedation.  He has noticed improvement with lidocaine ointment, but still has relatively severe sensitivity over his chest.  He has tried capsaicin ointment and Aspercream which also helps.   Medications:  Current Outpatient Prescriptions on File Prior to Visit  Medication Sig Dispense Refill  . atorvastatin (LIPITOR) 40 MG tablet TAKE 1 TABLET BY MOUTH DAILY 100 tablet 3  . atorvastatin (LIPITOR) 40 MG tablet TAKE 1 TABLET BY MOUTH DAILY 100 tablet 0  . gabapentin (NEURONTIN) 600 MG tablet Take 1 tablet (600 mg total) by mouth 3 (three) times daily. 100 tablet 3  . lidocaine (XYLOCAINE) 5 % ointment Apply to chest twice daily as needed. 50 g 3  . omeprazole (PRILOSEC) 20 MG capsule TAKE 1 CAPSULE BY MOUTH DAILY 100 capsule 3  . omeprazole (PRILOSEC) 20 MG capsule TAKE ONE CAPSULE BY MOUTH DAILY 100 capsule 0   No current  facility-administered medications on file prior to visit.    Allergies: No Known Allergies  Review of Systems:  CONSTITUTIONAL: No fevers, chills, night sweats, or weight loss.  EYES: No visual changes or eye pain ENT: No hearing changes.  No history of nose bleeds.   RESPIRATORY: No cough, wheezing and shortness of breath.   CARDIOVASCULAR: Negative for chest pain, and palpitations.   GI: Negative for abdominal discomfort, blood in stools or black stools.  No recent change in bowel habits.   GU:  No history of incontinence.   MUSCLOSKELETAL: No history of joint pain or swelling.  No myalgias.   SKIN: Negative for lesions, rash, and itching.   ENDOCRINE: Negative for cold or heat intolerance, polydipsia or goiter.   PSYCH:  No depression or anxiety symptoms.   NEURO: As Above.   Vital Signs:  BP 128/80 mmHg  Pulse 98  Ht 5\' 9"  (1.753 m)  Wt 226 lb 8 oz (102.74 kg)  BMI 33.43 kg/m2  SpO2 97%   Neurological Exam: MENTAL STATUS including orientation to time, place, person, recent and remote memory, attention span and concentration, language, and fund of knowledge is normal.  Speech is not dysarthric.  CRANIAL NERVES:  Face is symmetric.   MOTOR:  Motor strength is 5/5 in all extremities.  Rash has completely healed.  SENSORY: Hyperesthesia to pin prick over the left T3 anterior dermatome.   COORDINATION/GAIT:  Gait narrow based and stable.    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  Greater than 50% of this time was spent in counseling,  explanation of diagnosis, planning of further management, and coordination of care.   Thank you for allowing me to participate in patient's care.  If I can answer any additional questions, I would be pleased to do so.    Sincerely,    Donika K. Posey Pronto, DO

## 2014-05-09 ENCOUNTER — Ambulatory Visit (INDEPENDENT_AMBULATORY_CARE_PROVIDER_SITE_OTHER): Payer: Medicare Other | Admitting: Neurology

## 2014-05-09 ENCOUNTER — Encounter: Payer: Self-pay | Admitting: Neurology

## 2014-05-09 VITALS — BP 138/78 | HR 97 | Ht 69.0 in | Wt 223.4 lb

## 2014-05-09 DIAGNOSIS — M792 Neuralgia and neuritis, unspecified: Secondary | ICD-10-CM

## 2014-05-09 DIAGNOSIS — B0223 Postherpetic polyneuropathy: Secondary | ICD-10-CM

## 2014-05-09 MED ORDER — PREGABALIN 100 MG PO CAPS: 100.0000 mg | ORAL_CAPSULE | Freq: Two times a day (BID) | ORAL | Status: DC

## 2014-05-09 MED ORDER — PREGABALIN 50 MG PO CAPS: 50.0000 mg | ORAL_CAPSULE | Freq: Three times a day (TID) | ORAL | Status: DC

## 2014-05-09 NOTE — Progress Notes (Signed)
Follow-up Visit   Date: 05/09/2014    Travis Schwartz MRN: [id number] DOB: 1941/02/27   Interim History: Travis Schwartz is a 74 y.o. right-handed Caucasian male with hyperlipidemia, prostate cancer s/p resection (1992), hyperlipidemia, and GERD returning to the clinic for follow-up of postherpetic neuralgia.  The patient was accompanied to the clinic by self.  History of present illness: Starting in early September, he developed severe chest pain and initially thought he pulled a muscle because he was helping his brother do some outdoor work. Soon after, he developed a erythematous rash over the left chest wall and under his arm. He went to his PCP and was diagnosed with shingles for which he was given acyclovir and neurontin. Pain is described as stabbing and sharp over the chest and itching sensation over there arm and chest. Pain is worse at night. Light pressure exacerbates his pain. He has tried taking neurontin 300mg  TID, ultram, and lidocaine patch with modest benefit.   UPDATE 03/13/2014:  He tapered his gabapentin down to 600mg  at bedtime because of sedation.  He has noticed improvement with lidocaine ointment, but still has relatively severe sensitivity over his chest.  He has tried capsaicin ointment and Aspercream which also helps.  UPDATE 05/09/2014:  He still has severe pain from post-herpetic neuralgia, which has not improved.  He is unable to afford lidocaine ointment because insurance will not cover the cost. Asper cream and capsaicin with aloe seems to provide benefit.  He also complains of generalized joint stiffness.  Medications:  Current Outpatient Prescriptions on File Prior to Visit  Medication Sig Dispense Refill  . atorvastatin (LIPITOR) 40 MG tablet TAKE 1 TABLET BY MOUTH DAILY 100 tablet 3  . atorvastatin (LIPITOR) 40 MG tablet TAKE 1 TABLET BY MOUTH DAILY 100 tablet 0  . gabapentin (NEURONTIN) 600 MG tablet Take 1 tablet (600 mg total) by mouth 3 (three)  times daily. (Patient taking differently: Take 300 mg by mouth at bedtime. ) 100 tablet 3  . lidocaine (XYLOCAINE) 5 % ointment Apply to chest twice daily as needed. 50 g 3  . omeprazole (PRILOSEC) 20 MG capsule TAKE 1 CAPSULE BY MOUTH DAILY 100 capsule 3   No current facility-administered medications on file prior to visit.    Allergies: No Known Allergies  Review of Systems:  CONSTITUTIONAL: No fevers, chills, night sweats, or weight loss.  EYES: No visual changes or eye pain ENT: No hearing changes.  No history of nose bleeds.   RESPIRATORY: No cough, wheezing and shortness of breath.   CARDIOVASCULAR: Negative for chest pain, and palpitations.   GI: Negative for abdominal discomfort, blood in stools or black stools.  No recent change in bowel habits.   GU:  No history of incontinence.   MUSCLOSKELETAL: No history of joint pain or swelling.  No myalgias.   SKIN: Negative for lesions, rash, and itching.   ENDOCRINE: Negative for cold or heat intolerance, polydipsia or goiter.   PSYCH:  No depression or anxiety symptoms.   NEURO: As Above.   Vital Signs:  BP 138/78 mmHg  Pulse 97  Ht 5\' 9"  (1.753 m)  Wt 223 lb 7 oz (101.351 kg)  BMI 32.98 kg/m2  SpO2 95%   Neurological Exam: MENTAL STATUS including orientation to time, place, person, recent and remote memory, attention span and concentration, language, and fund of knowledge is normal.  Speech is not dysarthric.  CRANIAL NERVES:  Face is symmetric.   MOTOR:  Motor strength is  5/5 in all extremities.    SENSORY: Hyperesthesia to pin prick over the left T3 anterior dermatome.   COORDINATION/GAIT:  Gait narrow based and stable.    IMPRESSION/PLAN: Post-herpetic neuralgia, pain is unchanged and still with hyperesthesias over left T3 dermatome  - Previously tried:  gabapentin (sedation, currently taking gabapentin 300mg ), nortriptyline (insomnia), lidocaine ointment (too expensive)  - Start Lyrica 50mg  twice daily for one  week, then increase to 100mg  BID  - Continue topical ointments  Patient to call with update in 1 month to determine further titration of Lyrica RTC in 2 months   The duration of this appointment visit was 20 minutes of face-to-face time with the patient.  Greater than 50% of this time was spent in counseling, explanation of diagnosis, planning of further management, and coordination of care.   Thank you for allowing me to participate in patient's care.  If I can answer any additional questions, I would be pleased to do so.    Sincerely,    Zarinah Oviatt K. Posey Pronto, DO

## 2014-05-09 NOTE — Patient Instructions (Addendum)
1.  Start Lyrica 50mg  twice daily for one week, then increase to 100mg  twice daily.  Please call me with an update in 1 months so I can tell you how to make further adjustments of the medication.  If you are tolerating the Lyrica, we will stop gabapentin. 2.  Continue topical ointments as you are 3.  Return to clinic in 2 months

## 2014-06-12 ENCOUNTER — Other Ambulatory Visit: Payer: Self-pay | Admitting: Family Medicine

## 2014-07-11 ENCOUNTER — Ambulatory Visit (INDEPENDENT_AMBULATORY_CARE_PROVIDER_SITE_OTHER): Payer: Medicare Other | Admitting: Neurology

## 2014-07-11 ENCOUNTER — Encounter: Payer: Self-pay | Admitting: Neurology

## 2014-07-11 VITALS — BP 130/80 | HR 87 | Ht 69.0 in | Wt 225.1 lb

## 2014-07-11 DIAGNOSIS — B0223 Postherpetic polyneuropathy: Secondary | ICD-10-CM | POA: Diagnosis not present

## 2014-07-11 DIAGNOSIS — M792 Neuralgia and neuritis, unspecified: Secondary | ICD-10-CM

## 2014-07-11 NOTE — Progress Notes (Signed)
Follow-up Visit   Date: 07/11/2014    Travis Schwartz MRN: [id number] DOB: [birth year]   Interim History: Travis Schwartz is a 74 y.o. right-handed Caucasian male with hyperlipidemia, prostate cancer s/p resection (1992), hyperlipidemia, and GERD returning to the clinic for follow-up of postherpetic neuralgia.  The patient was accompanied to the clinic by self.  History of present illness: Starting in early September, he developed severe chest pain and initially thought he pulled a muscle because he was helping his brother do some outdoor work. Soon after, he developed a erythematous rash over the left chest wall and under his arm. He went to his PCP and was diagnosed with shingles for which he was given acyclovir and neurontin. Pain is described as stabbing and sharp over the chest and itching sensation over there arm and chest. Pain is worse at night. Light pressure exacerbates his pain. He has tried taking neurontin 300mg  TID, ultram, and lidocaine patch with modest benefit.   UPDATE 03/13/2014:  He tapered his gabapentin down to 600mg  at bedtime because of sedation.  He has noticed improvement with lidocaine ointment, but still has relatively severe sensitivity over his chest.  He has tried capsaicin ointment and Aspercream which also helps.  UPDATE 05/09/2014:  He still has severe pain from post-herpetic neuralgia, which has not improved.  He is unable to afford lidocaine ointment because insurance will not cover the cost. Asper cream and capsaicin with aloe seems to provide benefit.  He also complains of generalized joint stiffness.  UPDATE 07/11/2014:  Pain is no longer as severe as it was previously, he could not tolerate gabapentin 600 mg, so stopped it. He is only using topical ointments such as capsaicin, Aspercreme, and horse linoment which provides adequate benefit for 6-8 hours. He did not find much improvement with Lyrica samples were provided, and says it made him feel very  sleepy. He no longer has hypersensitivity of the chest, but continues to have discomfort over his underarm area.  Medications:  No current outpatient prescriptions on file prior to visit.   No current facility-administered medications on file prior to visit.    Allergies: No Known Allergies  Review of Systems:  CONSTITUTIONAL: No fevers, chills, night sweats, or weight loss.  EYES: No visual changes or eye pain ENT: No hearing changes.  No history of nose bleeds.   RESPIRATORY: No cough, wheezing and shortness of breath.   CARDIOVASCULAR: Negative for chest pain, and palpitations.   GI: Negative for abdominal discomfort, blood in stools or black stools.  No recent change in bowel habits.   GU:  No history of incontinence.   MUSCLOSKELETAL: No history of joint pain or swelling.  No myalgias.   SKIN: Negative for lesions, rash, and itching.   ENDOCRINE: Negative for cold or heat intolerance, polydipsia or goiter.   PSYCH:  No depression or anxiety symptoms.   NEURO: As Above.   Vital Signs:  BP 130/80 mmHg  Pulse 87  Ht 5\' 9"  (1.753 m)  Wt 225 lb 2 oz (102.116 kg)  BMI 33.23 kg/m2  SpO2 94%   Neurological Exam:  CRANIAL NERVES:  Face is symmetric.   MOTOR:  Motor strength is 5/5 in all extremities.    SENSORY: Hyperesthesia over the left T3 anterior dermatome.   COORDINATION/GAIT:  Gait narrow based and stable.    IMPRESSION/PLAN: Post-herpetic neuralgia, slowly improving  - Previously tried:  gabapentin (sedation), nortriptyline (insomnia), lidocaine ointment (too expensive), lyrica (sleepy, too expensive)  -  Continue topical ointments (Aspercream, horse linoment, Capsicin), discussed adding Vicks vapor rub  - Consider low dose gabapentin 100mg  going forward; alternative options:  depakote, desipramine Return to clinic as needed   The duration of this appointment visit was 20 minutes of face-to-face time with the patient.  Greater than 50% of this time was spent  in counseling, explanation of diagnosis, planning of further management, and coordination of care.   Thank you for allowing me to participate in patient's care.  If I can answer any additional questions, I would be pleased to do so.    Sincerely,    Gianni Mihalik K. Posey Pronto, DO

## 2014-07-11 NOTE — Patient Instructions (Signed)
1.  Keep using the ointments as you are.  You may also try vicks vaporub 2.  If your pain worsens, let me know and I can send a prescription for gabapentin 100mg  tablets (lower dose to minimize side effects) 3.  Return to clinic as needed

## 2014-12-11 ENCOUNTER — Other Ambulatory Visit: Payer: Self-pay | Admitting: Family Medicine

## 2014-12-15 ENCOUNTER — Encounter: Payer: Self-pay | Admitting: Family Medicine

## 2014-12-15 ENCOUNTER — Ambulatory Visit (INDEPENDENT_AMBULATORY_CARE_PROVIDER_SITE_OTHER): Payer: Medicare Other | Admitting: Family Medicine

## 2014-12-15 VITALS — BP 160/90 | HR 80 | Temp 98.0°F | Ht 69.0 in | Wt 223.0 lb

## 2014-12-15 DIAGNOSIS — Z23 Encounter for immunization: Secondary | ICD-10-CM

## 2014-12-15 DIAGNOSIS — K2100 Gastro-esophageal reflux disease with esophagitis, without bleeding: Secondary | ICD-10-CM

## 2014-12-15 DIAGNOSIS — Z8546 Personal history of malignant neoplasm of prostate: Secondary | ICD-10-CM | POA: Diagnosis not present

## 2014-12-15 DIAGNOSIS — E785 Hyperlipidemia, unspecified: Secondary | ICD-10-CM

## 2014-12-15 DIAGNOSIS — Z0000 Encounter for general adult medical examination without abnormal findings: Secondary | ICD-10-CM | POA: Diagnosis not present

## 2014-12-15 DIAGNOSIS — B0223 Postherpetic polyneuropathy: Secondary | ICD-10-CM

## 2014-12-15 LAB — POCT URINALYSIS DIPSTICK
BILIRUBIN UA: NEGATIVE
GLUCOSE UA: NEGATIVE
Ketones, UA: NEGATIVE
Leukocytes, UA: NEGATIVE
NITRITE UA: NEGATIVE
RBC UA: NEGATIVE
Spec Grav, UA: >=1.03
Urobilinogen, UA: 0.2
pH, UA: 5.5

## 2014-12-15 LAB — BASIC METABOLIC PANEL
BUN: 23 mg/dL (ref 6–23)
CALCIUM: 9.1 mg/dL (ref 8.4–10.5)
CHLORIDE: 105 meq/L (ref 96–112)
CO2: 24 meq/L (ref 19–32)
Creatinine, Ser: 0.92 mg/dL (ref 0.40–1.50)
GFR: 85.26 mL/min (ref >60.00)
GLUCOSE: 85 mg/dL (ref 70–99)
Potassium: 4.1 mEq/L (ref 3.5–5.1)
SODIUM: 141 meq/L (ref 135–145)

## 2014-12-15 LAB — CBC WITH DIFFERENTIAL/PLATELET
BASOS ABS: 0 10*3/uL (ref 0.0–0.1)
BASOS PCT: 0.6 % (ref 0.0–3.0)
Eosinophils Absolute: 0.1 10*3/uL (ref 0.0–0.7)
Eosinophils Relative: 1.4 % (ref 0.0–5.0)
HEMATOCRIT: 45.6 % (ref 39.0–52.0)
Hemoglobin: 15.3 g/dL (ref 13.0–17.0)
LYMPHS PCT: 23.5 % (ref 12.0–46.0)
Lymphs Abs: 1.4 10*3/uL (ref 0.7–4.0)
MCHC: 33.6 g/dL (ref 30.0–36.0)
MCV: 95.6 fl (ref 78.0–100.0)
MONOS PCT: 6.2 % (ref 3.0–12.0)
Monocytes Absolute: 0.4 10*3/uL (ref 0.1–1.0)
NEUTROS ABS: 4 10*3/uL (ref 1.4–7.7)
Neutrophils Relative %: 68.3 % (ref 43.0–77.0)
PLATELETS: 317 10*3/uL (ref 150.0–400.0)
RBC: 4.77 Mil/uL (ref 4.22–5.81)
RDW: 14.3 % (ref 11.5–15.5)
WBC: 5.8 10*3/uL (ref 4.0–10.5)

## 2014-12-15 LAB — LIPID PANEL
CHOL/HDL RATIO: 4
Cholesterol: 151 mg/dL (ref 0–200)
HDL: 38.2 mg/dL — ABNORMAL LOW (ref >39.00)
LDL Cholesterol: 77 mg/dL (ref 0–99)
NONHDL: 112.65
Triglycerides: 180 mg/dL — ABNORMAL HIGH (ref 0.0–149.0)
VLDL: 36 mg/dL (ref 0.0–40.0)

## 2014-12-15 LAB — HEPATIC FUNCTION PANEL
ALK PHOS: 80 U/L (ref 39–117)
ALT: 37 U/L (ref 0–53)
AST: 24 U/L (ref 0–37)
Albumin: 4.2 g/dL (ref 3.5–5.2)
BILIRUBIN DIRECT: 0.2 mg/dL (ref 0.0–0.3)
BILIRUBIN TOTAL: 0.9 mg/dL (ref 0.2–1.2)
Total Protein: 6.1 g/dL (ref 6.0–8.3)

## 2014-12-15 LAB — TSH: TSH: 5.15 u[IU]/mL — ABNORMAL HIGH (ref 0.35–4.50)

## 2014-12-15 LAB — PSA: PSA: 0.07 ng/mL — ABNORMAL LOW (ref 0.10–4.00)

## 2014-12-15 MED ORDER — ATORVASTATIN CALCIUM 40 MG PO TABS: 40.0000 mg | ORAL_TABLET | Freq: Every day | ORAL | Status: DC

## 2014-12-15 MED ORDER — OMEPRAZOLE 20 MG PO CPDR: 20.0000 mg | DELAYED_RELEASE_CAPSULE | Freq: Every day | ORAL | Status: DC

## 2014-12-15 NOTE — Patient Instructions (Signed)
Continue current medications  Walk 30 minutes daily  Follow-up in one year for general physical examination sooner if any problem  Beaulah Dinning,,,,,,,,,,,, our new adult nurse practitioner from [facility]

## 2014-12-15 NOTE — Progress Notes (Signed)
Subjective:    Patient ID: Travis Schwartz, male    DOB: 03-Jun-1940, 74 y.o.   MRN: [id number]  HPI Travis Schwartz is a 74 year old married male nonsmoker who comes in today for general physical examination because of a history of hyperlipidemia, chronic reflux esophagitis and a history of prostate cancer  He had his prostate removed many years ago he's no longer followed by urology.  He takes Lipitor 40 mg a day for hyperlipidemia. He does not take an aspirin tablet because he has a hiatal hernia and chronic reflux for which he has to take Prilosec 20 mg daily  He gets routine eye care, dental care, colonoscopy 2014 normal, flu shot and pneumonia shot 13 given today.  He walks about 10 minutes a day. Encouraged to walk 30 minutes daily  Weight up to 225 pounds  Cognitive function normal he walks sporadically, home health safety reviewed no issues identified, no guns in the house, he does not have a healthcare power of attorney nor living well. He was encouraged to do so  He did have shingles 2 years ago. He developed a post shingles neuropathy. Off medication his discomfort is a 3 on a scale of 1-10. We send him to neurology they tried 3 different medications but they'll had side effects. He's content to live with a pain level of a 3. Since his prostatectomy urine is been normal. He does have some erectile dysfunction.   Review of Systems  Constitutional: Negative.   HENT: Negative.   Eyes: Negative.   Respiratory: Negative.   Cardiovascular: Negative.   Gastrointestinal: Negative.   Endocrine: Negative.   Genitourinary: Negative.   Musculoskeletal: Negative.   Skin: Negative.   Allergic/Immunologic: Negative.   Neurological: Negative.   Hematological: Negative.   Psychiatric/Behavioral: Negative.        Objective:   Physical Exam  Constitutional: He is oriented to person, place, and time. He appears well-developed and well-nourished.  HENT:  Head: Normocephalic and atraumatic.    Right Ear: External ear normal.  Left Ear: External ear normal.  Nose: Nose normal.  Mouth/Throat: Oropharynx is clear and moist.  Eyes: Conjunctivae and EOM are normal. Pupils are equal, round, and reactive to light.  Neck: Normal range of motion. Neck supple. No JVD present. No tracheal deviation present. No thyromegaly present.  Cardiovascular: Normal rate, regular rhythm, normal heart sounds and intact distal pulses.  Exam reveals no gallop and no friction rub.   No murmur heard. No carotid neurologic bruits peripheral pulses 2+ and symmetrical  Pulmonary/Chest: Effort normal and breath sounds normal. No stridor. No respiratory distress. He has no wheezes. He has no rales. He exhibits no tenderness.  Abdominal: Soft. Bowel sounds are normal. He exhibits no distension and no mass. There is no tenderness. There is no rebound and no guarding.  Genitourinary: Rectum normal and penis normal. Guaiac negative stool. No penile tenderness.  Surgically removed  Musculoskeletal: Normal range of motion. He exhibits no edema or tenderness.  Lymphadenopathy:    He has no cervical adenopathy.  Neurological: He is alert and oriented to person, place, and time. He has normal reflexes. No cranial nerve deficit. He exhibits normal muscle tone.  Skin: Skin is warm and dry. No rash noted. No erythema. No pallor.  Total body skin exam normal he has 100s a seborrheic keratosis  Scar midline from the umbilicus appears from previous prostatectomy  Psychiatric: He has a normal mood and affect. His behavior is normal. Judgment and thought content  normal.  Nursing note and vitals reviewed.         Assessment & Plan:  Healthy male  History of hyperlipidemia,,,,,,, continue Lipitor 40 mg daily,,,,, check labs  History of chronic reflux esophagitis and hiatal hernia,,,,,,, continue avoid aspirin and aspirin products Prilosec 20 mg daily  Obesity,,,,,,,,, again discussed diet exercise and weight  loss  Status post prostatectomy for prostate cancer,,,,,,,,,,,,, asymptomatic exam negative  Post shingles neuropathy,,,,,,,,,, fading over time without medication

## 2014-12-15 NOTE — Progress Notes (Signed)
Pre visit review using our clinic review tool, if applicable. No additional management support is needed unless otherwise documented below in the visit note. 

## 2014-12-18 NOTE — Progress Notes (Signed)
   Subjective:    Patient ID: Travis Schwartz, male    DOB: 1940/12/19, 74 y.o.   MRN: [id number]  HPI    Review of Systems     Objective:   Physical Exam        Assessment & Plan:  EKG done in office.  Results read by Dr Sherren Mocha as normal.

## 2014-12-30 ENCOUNTER — Ambulatory Visit (INDEPENDENT_AMBULATORY_CARE_PROVIDER_SITE_OTHER): Payer: Medicare Other | Admitting: Family Medicine

## 2014-12-30 ENCOUNTER — Encounter: Payer: Self-pay | Admitting: Family Medicine

## 2014-12-30 VITALS — BP 152/84 | HR 82 | Temp 98.3°F | Ht 69.5 in | Wt 224.0 lb

## 2014-12-30 DIAGNOSIS — I10 Essential (primary) hypertension: Secondary | ICD-10-CM | POA: Diagnosis not present

## 2014-12-30 MED ORDER — HYDROCHLOROTHIAZIDE 12.5 MG PO TABS: 12.5000 mg | ORAL_TABLET | Freq: Every day | ORAL | Status: DC

## 2014-12-30 NOTE — Progress Notes (Signed)
   Subjective:    Patient ID: Travis Schwartz, male    DOB: Jun 17, 1940, 74 y.o.   MRN: [id number]  HPI Travis Schwartz is a 74 year old married male nonsmoker who comes in today for follow-up of elevated blood pressure  We saw him a couple weeks ago for general physical examination. At that time his blood pressure was slightly elevated. He is been monitoring his blood pressure at home. It varies from 855-0 1586-82 diastolic. Such just mildly elevated. BP here today in the right arm sitting position 130/80 left arm 135/85  He has trace peripheral edema  No family history of hypertension   Review of Systems review of systems otherwise negative    Objective:   Physical Exam well-developed well-nourished male no acute distress vital signs stable he is afebrile BP is above        Assessment & Plan:   based on his home monitoring readings which I think are accurate I think he has very mild early hypertension......... start 12.5 mg of hydrochlorothiazide daily follow-up in 6 weeks

## 2014-12-30 NOTE — Patient Instructions (Signed)
Hydrochlorothiazide 12.5 mg..........Marland Kitchen 1 daily in the morning  Check your blood pressure Monday Wednesday Friday in the morning  Return in 6 weeks for follow-up

## 2015-02-10 ENCOUNTER — Encounter: Payer: Self-pay | Admitting: Family Medicine

## 2015-02-10 ENCOUNTER — Ambulatory Visit (INDEPENDENT_AMBULATORY_CARE_PROVIDER_SITE_OTHER): Payer: Medicare Other | Admitting: Family Medicine

## 2015-02-10 VITALS — BP 130/90 | Temp 97.6°F | Wt 226.0 lb

## 2015-02-10 DIAGNOSIS — I10 Essential (primary) hypertension: Secondary | ICD-10-CM | POA: Diagnosis not present

## 2015-02-10 NOTE — Progress Notes (Signed)
   Subjective:    Patient ID: Travis Schwartz, male    DOB: 06/22/1940, 74 y.o.   MRN: [id number]  HPI Travis Schwartz is a 74 year old male nonsmoker who comes in today for reevaluation of hypertension  He had mild hypertension we saw him in September for his physical examination. We started him on Hydrocort thiazide 12.5 mg daily. BP now 130/90. He's been checking his blood pressure at home on the Wednesday Friday blood pressures of all dropped to normal   Review of Systems Review of systems negative well-developed well-nourished male no acute distress vital signs stable he is afebrile    Objective:   Physical Exam  Well-developed well-nourished male no acute distress vital signs stable he is afebrile BP 130/90      Assessment & Plan:  Hypertension at goal............. continue daily medicine....... BP check weekly follow-up when necessary or September 2017.Marland Kitchen

## 2015-02-10 NOTE — Progress Notes (Signed)
Pre visit review using our clinic review tool, if applicable. No additional management support is needed unless otherwise documented below in the visit note. 

## 2015-02-10 NOTE — Patient Instructions (Signed)
Continue daily medication  Check your blood pressure weekly  Blood pressure goal 140/90 or less  When you call in July for your physical examination in September.......... Georgina Snell or Almyra Free....... are 2 new adult nurse practitioner's and Dr. Martinique..... Pick one of the new providers for your physical examination and to establish for long-term care

## 2015-02-23 ENCOUNTER — Encounter: Payer: Self-pay | Admitting: Gastroenterology

## 2015-03-02 NOTE — Progress Notes (Signed)
   Subjective:    Patient ID: Travis Schwartz, male    DOB: 12-Jun-1940, 74 y.o.   MRN: [id number]  HPI    Review of Systems     Objective:   Physical Exam        Assessment & Plan:  An EKG was performed at today's visit.  It was interpreted by Dr Sherren Mocha as normal.

## 2015-04-03 ENCOUNTER — Other Ambulatory Visit: Payer: Self-pay | Admitting: Family Medicine

## 2015-04-05 ENCOUNTER — Other Ambulatory Visit: Payer: Self-pay | Admitting: Family Medicine

## 2015-12-16 ENCOUNTER — Ambulatory Visit (INDEPENDENT_AMBULATORY_CARE_PROVIDER_SITE_OTHER): Payer: Medicare Other | Admitting: Family Medicine

## 2015-12-16 ENCOUNTER — Encounter: Payer: Self-pay | Admitting: Family Medicine

## 2015-12-16 VITALS — BP 126/86 | HR 96 | Temp 97.6°F | Ht 68.0 in | Wt 221.9 lb

## 2015-12-16 DIAGNOSIS — Z23 Encounter for immunization: Secondary | ICD-10-CM

## 2015-12-16 DIAGNOSIS — I10 Essential (primary) hypertension: Secondary | ICD-10-CM | POA: Diagnosis not present

## 2015-12-16 DIAGNOSIS — E785 Hyperlipidemia, unspecified: Secondary | ICD-10-CM

## 2015-12-16 DIAGNOSIS — B0223 Postherpetic polyneuropathy: Secondary | ICD-10-CM

## 2015-12-16 DIAGNOSIS — K2100 Gastro-esophageal reflux disease with esophagitis, without bleeding: Secondary | ICD-10-CM

## 2015-12-16 DIAGNOSIS — J309 Allergic rhinitis, unspecified: Secondary | ICD-10-CM

## 2015-12-16 DIAGNOSIS — Z8546 Personal history of malignant neoplasm of prostate: Secondary | ICD-10-CM

## 2015-12-16 LAB — LIPID PANEL
CHOLESTEROL: 147 mg/dL (ref 0–200)
HDL: 41.4 mg/dL (ref >39.00)
LDL Cholesterol: 77 mg/dL (ref 0–99)
NonHDL: 105.2
TRIGLYCERIDES: 143 mg/dL (ref 0.0–149.0)
Total CHOL/HDL Ratio: 4
VLDL: 28.6 mg/dL (ref 0.0–40.0)

## 2015-12-16 LAB — HEPATIC FUNCTION PANEL
ALBUMIN: 4.2 g/dL (ref 3.5–5.2)
ALT: 37 U/L (ref 0–53)
AST: 23 U/L (ref 0–37)
Alkaline Phosphatase: 61 U/L (ref 39–117)
BILIRUBIN TOTAL: 1.2 mg/dL (ref 0.2–1.2)
Bilirubin, Direct: 0.2 mg/dL (ref 0.0–0.3)
TOTAL PROTEIN: 6.2 g/dL (ref 6.0–8.3)

## 2015-12-16 LAB — BASIC METABOLIC PANEL
BUN: 26 mg/dL — ABNORMAL HIGH (ref 6–23)
CALCIUM: 9.1 mg/dL (ref 8.4–10.5)
CHLORIDE: 104 meq/L (ref 96–112)
CO2: 30 meq/L (ref 19–32)
Creatinine, Ser: 1.02 mg/dL (ref 0.40–1.50)
GFR: 75.49 mL/min (ref >60.00)
GLUCOSE: 84 mg/dL (ref 70–99)
Potassium: 4.5 mEq/L (ref 3.5–5.1)
SODIUM: 144 meq/L (ref 135–145)

## 2015-12-16 LAB — CBC WITH DIFFERENTIAL/PLATELET
Basophils Absolute: 0 10*3/uL (ref 0.0–0.1)
Basophils Relative: 0.6 % (ref 0.0–3.0)
EOS ABS: 0.1 10*3/uL (ref 0.0–0.7)
EOS PCT: 1.9 % (ref 0.0–5.0)
HCT: 43.6 % (ref 39.0–52.0)
Hemoglobin: 15.1 g/dL (ref 13.0–17.0)
LYMPHS ABS: 1.3 10*3/uL (ref 0.7–4.0)
Lymphocytes Relative: 27.2 % (ref 12.0–46.0)
MCHC: 34.6 g/dL (ref 30.0–36.0)
MCV: 94.9 fl (ref 78.0–100.0)
MONO ABS: 0.4 10*3/uL (ref 0.1–1.0)
Monocytes Relative: 7.9 % (ref 3.0–12.0)
Neutro Abs: 2.9 10*3/uL (ref 1.4–7.7)
Neutrophils Relative %: 62.4 % (ref 43.0–77.0)
PLATELETS: 347 10*3/uL (ref 150.0–400.0)
RBC: 4.6 Mil/uL (ref 4.22–5.81)
RDW: 14.7 % (ref 11.5–15.5)
WBC: 4.6 10*3/uL (ref 4.0–10.5)

## 2015-12-16 LAB — POCT URINALYSIS DIPSTICK
GLUCOSE UA: NEGATIVE
Ketones, UA: NEGATIVE
Leukocytes, UA: NEGATIVE
NITRITE UA: NEGATIVE
RBC UA: NEGATIVE
Spec Grav, UA: 1.02
Urobilinogen, UA: 1
pH, UA: 5.5

## 2015-12-16 LAB — PSA: PSA: 0.08 ng/mL — AB (ref 0.10–4.00)

## 2015-12-16 LAB — TSH: TSH: 5.29 u[IU]/mL — AB (ref 0.35–4.50)

## 2015-12-16 MED ORDER — OMEPRAZOLE 20 MG PO CPDR: 20.0000 mg | DELAYED_RELEASE_CAPSULE | Freq: Every day | ORAL | 3 refills | Status: DC

## 2015-12-16 MED ORDER — HYDROCHLOROTHIAZIDE 12.5 MG PO TABS: 12.5000 mg | ORAL_TABLET | Freq: Every day | ORAL | 3 refills | Status: DC

## 2015-12-16 MED ORDER — ATORVASTATIN CALCIUM 40 MG PO TABS: 40.0000 mg | ORAL_TABLET | Freq: Every day | ORAL | 3 refills | Status: DC

## 2015-12-16 NOTE — Progress Notes (Signed)
Pre visit review using our clinic review tool, if applicable. No additional management support is needed unless otherwise documented below in the visit note. 

## 2015-12-16 NOTE — Patient Instructions (Signed)
Continue your current medication  Begin the Mediterranean diet,,,,,,,,,,,, 30 minutes of walking daily  Call in July for your physical examination next September. Get your lab work a week at a time next time.

## 2015-12-19 ENCOUNTER — Other Ambulatory Visit: Payer: Self-pay | Admitting: Family Medicine

## 2016-02-10 DIAGNOSIS — J069 Acute upper respiratory infection, unspecified: Secondary | ICD-10-CM | POA: Diagnosis not present

## 2016-02-10 DIAGNOSIS — B9689 Other specified bacterial agents as the cause of diseases classified elsewhere: Secondary | ICD-10-CM | POA: Diagnosis not present

## 2016-02-10 DIAGNOSIS — R05 Cough: Secondary | ICD-10-CM | POA: Diagnosis not present

## 2016-09-20 ENCOUNTER — Ambulatory Visit: Payer: Medicare Other

## 2016-12-19 ENCOUNTER — Ambulatory Visit (INDEPENDENT_AMBULATORY_CARE_PROVIDER_SITE_OTHER): Payer: Medicare Other | Admitting: Family Medicine

## 2016-12-19 ENCOUNTER — Encounter: Payer: Self-pay | Admitting: Family Medicine

## 2016-12-19 VITALS — BP 132/80 | HR 74 | Temp 98.0°F | Ht 68.0 in | Wt 209.0 lb

## 2016-12-19 DIAGNOSIS — K2100 Gastro-esophageal reflux disease with esophagitis, without bleeding: Secondary | ICD-10-CM

## 2016-12-19 DIAGNOSIS — Z8546 Personal history of malignant neoplasm of prostate: Secondary | ICD-10-CM | POA: Diagnosis not present

## 2016-12-19 DIAGNOSIS — Z23 Encounter for immunization: Secondary | ICD-10-CM | POA: Diagnosis not present

## 2016-12-19 DIAGNOSIS — I10 Essential (primary) hypertension: Secondary | ICD-10-CM

## 2016-12-19 DIAGNOSIS — E785 Hyperlipidemia, unspecified: Secondary | ICD-10-CM

## 2016-12-19 LAB — POCT URINALYSIS DIPSTICK
BILIRUBIN UA: NEGATIVE
GLUCOSE UA: NEGATIVE
Ketones, UA: NEGATIVE
Leukocytes, UA: NEGATIVE
NITRITE UA: NEGATIVE
PH UA: 6 (ref 5.0–8.0)
RBC UA: NEGATIVE
Urobilinogen, UA: 0.2 E.U./dL

## 2016-12-19 LAB — BASIC METABOLIC PANEL
BUN: 24 mg/dL — AB (ref 6–23)
CALCIUM: 9.3 mg/dL (ref 8.4–10.5)
CO2: 28 meq/L (ref 19–32)
CREATININE: 0.88 mg/dL (ref 0.40–1.50)
Chloride: 102 mEq/L (ref 96–112)
GFR: 89.27 mL/min (ref >60.00)
GLUCOSE: 87 mg/dL (ref 70–99)
Potassium: 3.9 mEq/L (ref 3.5–5.1)
SODIUM: 141 meq/L (ref 135–145)

## 2016-12-19 LAB — CBC WITH DIFFERENTIAL/PLATELET
BASOS ABS: 0 10*3/uL (ref 0.0–0.1)
BASOS PCT: 0.5 % (ref 0.0–3.0)
EOS ABS: 0.1 10*3/uL (ref 0.0–0.7)
Eosinophils Relative: 1.3 % (ref 0.0–5.0)
HEMATOCRIT: 42.3 % (ref 39.0–52.0)
Hemoglobin: 14.6 g/dL (ref 13.0–17.0)
LYMPHS ABS: 1.6 10*3/uL (ref 0.7–4.0)
Lymphocytes Relative: 26.2 % (ref 12.0–46.0)
MCHC: 34.5 g/dL (ref 30.0–36.0)
MCV: 95.8 fl (ref 78.0–100.0)
MONOS PCT: 6.7 % (ref 3.0–12.0)
Monocytes Absolute: 0.4 10*3/uL (ref 0.1–1.0)
NEUTROS ABS: 4 10*3/uL (ref 1.4–7.7)
NEUTROS PCT: 65.3 % (ref 43.0–77.0)
PLATELETS: 390 10*3/uL (ref 150.0–400.0)
RBC: 4.41 Mil/uL (ref 4.22–5.81)
RDW: 15 % (ref 11.5–15.5)
WBC: 6.1 10*3/uL (ref 4.0–10.5)

## 2016-12-19 LAB — HEPATIC FUNCTION PANEL
ALBUMIN: 4.4 g/dL (ref 3.5–5.2)
ALK PHOS: 57 U/L (ref 39–117)
ALT: 35 U/L (ref 0–53)
AST: 21 U/L (ref 0–37)
BILIRUBIN DIRECT: 0.2 mg/dL (ref 0.0–0.3)
TOTAL PROTEIN: 6.1 g/dL (ref 6.0–8.3)
Total Bilirubin: 1.1 mg/dL (ref 0.2–1.2)

## 2016-12-19 LAB — LIPID PANEL
Cholesterol: 146 mg/dL (ref 0–200)
HDL: 40 mg/dL (ref >39.00)
LDL Cholesterol: 76 mg/dL (ref 0–99)
NonHDL: 105.99
TRIGLYCERIDES: 148 mg/dL (ref 0.0–149.0)
Total CHOL/HDL Ratio: 4
VLDL: 29.6 mg/dL (ref 0.0–40.0)

## 2016-12-19 LAB — PSA: PSA: 0.12 ng/mL (ref 0.10–4.00)

## 2016-12-19 LAB — TSH: TSH: 5.93 u[IU]/mL — ABNORMAL HIGH (ref 0.35–4.50)

## 2016-12-19 MED ORDER — ATORVASTATIN CALCIUM 40 MG PO TABS: 40.0000 mg | ORAL_TABLET | Freq: Every day | ORAL | 4 refills | Status: DC

## 2016-12-19 MED ORDER — OMEPRAZOLE 20 MG PO CPDR: 20.0000 mg | DELAYED_RELEASE_CAPSULE | Freq: Every day | ORAL | 4 refills | Status: DC

## 2016-12-19 MED ORDER — HYDROCHLOROTHIAZIDE 12.5 MG PO TABS: ORAL_TABLET | ORAL | 4 refills | Status: DC

## 2016-12-19 NOTE — Patient Instructions (Signed)
Continue current medications  Labs today...Marland KitchenMarland KitchenMarland Kitchen I will call you if there is anything abnormal  Start a diet and exercise program........... carbohydrate free........ walk 30 minutes daily  Soak in file your nails weekly

## 2016-12-19 NOTE — Progress Notes (Signed)
Travis Schwartz is a 76 year old married male nonsmoker who comes in today for general physical examination because of a history of hyperlipidemia not hypertension and reflux esophagitis.  He takes Hydrocort thiazide 12.5 daily for hypertension. His BP today is 132/80 pulse 74 and regular  He takes Lipitor and aspirin daily for hyperlipidemia. Lipids panel today  He takes Prilosec 20 mg daily for chronic reflux esophagitis.  He gets routine eye care......Marland Kitchen bilateral cataract extractions and lens implants 8 years ago doing well, regular dental care, colonoscopy 2014 normal  Immunizations....... seasonal flu shot given today information given on shingles vaccine.  14 point review of systems reviewed and otherwise negative except it's been over 15 years since he had his prostate removed. He's having no symptoms. Also has had his gallbladder removed.  Social history,,,,,,,,,,,,, married lives in [place] retired exercises rarely.  EKG was done because a history of hypertension hyperlipidemia EKG was unchanged from previous cardiograms  BP 132/80 (BP Location: Left Arm, Patient Position: Sitting, Cuff Size: Normal)   Pulse 74   Temp 98 F (36.7 C) (Oral)   Ht 5\' 8"  (1.727 m)   Wt 209 lb (94.8 kg)   BMI 31.78 kg/m  General he is well-developed overweight male no acute distress HEENT were negative except for evidence of bilateral cataract extractions with clear lenses. Neck was supple thyroid not enlarged no carotid bruits cardiopulmonary exam normal abdominal exam was normal except for massive panniculus and a scar in the midline from the umbilicus to the pubic area from previous prostate surgery. Also scar is right upper quadrant from previous cholecystectomy. Dental examination normal rectum normal prostate surgically removed guaiac negative extremities normal skin normal peripheral pulses normal  #1 hypertension at goal........ continue current therapy  #2 hyperlipidemia.......  continue Lipitor and aspirin check labs. #3... Chronic reflux esophagitis continue Prilosec  Overweight BMI 33.80............. discussed diet exercise and weight loss  #5 status post prostatectomy for prostate cancer  #6 status post cholecystectomy.,.

## 2017-07-12 DIAGNOSIS — M9902 Segmental and somatic dysfunction of thoracic region: Secondary | ICD-10-CM | POA: Diagnosis not present

## 2017-07-12 DIAGNOSIS — M546 Pain in thoracic spine: Secondary | ICD-10-CM | POA: Diagnosis not present

## 2017-07-12 DIAGNOSIS — M62830 Muscle spasm of back: Secondary | ICD-10-CM | POA: Diagnosis not present

## 2017-07-12 DIAGNOSIS — M5417 Radiculopathy, lumbosacral region: Secondary | ICD-10-CM | POA: Diagnosis not present

## 2017-07-12 DIAGNOSIS — M9903 Segmental and somatic dysfunction of lumbar region: Secondary | ICD-10-CM | POA: Diagnosis not present

## 2017-07-21 DIAGNOSIS — M5417 Radiculopathy, lumbosacral region: Secondary | ICD-10-CM | POA: Diagnosis not present

## 2017-07-21 DIAGNOSIS — M9903 Segmental and somatic dysfunction of lumbar region: Secondary | ICD-10-CM | POA: Diagnosis not present

## 2017-07-21 DIAGNOSIS — M9902 Segmental and somatic dysfunction of thoracic region: Secondary | ICD-10-CM | POA: Diagnosis not present

## 2017-07-21 DIAGNOSIS — M546 Pain in thoracic spine: Secondary | ICD-10-CM | POA: Diagnosis not present

## 2017-07-21 DIAGNOSIS — M62830 Muscle spasm of back: Secondary | ICD-10-CM | POA: Diagnosis not present

## 2017-07-24 DIAGNOSIS — M5137 Other intervertebral disc degeneration, lumbosacral region: Secondary | ICD-10-CM | POA: Diagnosis not present

## 2017-07-24 DIAGNOSIS — G8911 Acute pain due to trauma: Secondary | ICD-10-CM | POA: Diagnosis not present

## 2017-07-24 DIAGNOSIS — M545 Low back pain: Secondary | ICD-10-CM | POA: Diagnosis not present

## 2017-07-26 DIAGNOSIS — K219 Gastro-esophageal reflux disease without esophagitis: Secondary | ICD-10-CM | POA: Diagnosis not present

## 2017-07-26 DIAGNOSIS — S22000S Wedge compression fracture of unspecified thoracic vertebra, sequela: Secondary | ICD-10-CM | POA: Diagnosis not present

## 2017-07-26 DIAGNOSIS — S22070A Wedge compression fracture of T9-T10 vertebra, initial encounter for closed fracture: Secondary | ICD-10-CM | POA: Diagnosis not present

## 2017-07-26 DIAGNOSIS — M4854XA Collapsed vertebra, not elsewhere classified, thoracic region, initial encounter for fracture: Secondary | ICD-10-CM | POA: Insufficient documentation

## 2017-07-26 DIAGNOSIS — M5134 Other intervertebral disc degeneration, thoracic region: Secondary | ICD-10-CM | POA: Diagnosis not present

## 2017-08-04 DIAGNOSIS — Z79899 Other long term (current) drug therapy: Secondary | ICD-10-CM | POA: Diagnosis not present

## 2017-08-04 DIAGNOSIS — E559 Vitamin D deficiency, unspecified: Secondary | ICD-10-CM | POA: Diagnosis not present

## 2017-08-04 DIAGNOSIS — E785 Hyperlipidemia, unspecified: Secondary | ICD-10-CM | POA: Diagnosis not present

## 2017-08-04 DIAGNOSIS — S22000A Wedge compression fracture of unspecified thoracic vertebra, initial encounter for closed fracture: Secondary | ICD-10-CM | POA: Diagnosis not present

## 2017-08-04 DIAGNOSIS — E7800 Pure hypercholesterolemia, unspecified: Secondary | ICD-10-CM | POA: Diagnosis not present

## 2017-08-04 DIAGNOSIS — R5383 Other fatigue: Secondary | ICD-10-CM | POA: Diagnosis not present

## 2017-08-04 DIAGNOSIS — Z0000 Encounter for general adult medical examination without abnormal findings: Secondary | ICD-10-CM | POA: Diagnosis not present

## 2017-08-04 DIAGNOSIS — M8080XA Other osteoporosis with current pathological fracture, unspecified site, initial encounter for fracture: Secondary | ICD-10-CM | POA: Diagnosis not present

## 2017-08-14 DIAGNOSIS — J209 Acute bronchitis, unspecified: Secondary | ICD-10-CM | POA: Diagnosis not present

## 2017-08-14 DIAGNOSIS — S22000A Wedge compression fracture of unspecified thoracic vertebra, initial encounter for closed fracture: Secondary | ICD-10-CM | POA: Diagnosis not present

## 2017-08-14 DIAGNOSIS — K219 Gastro-esophageal reflux disease without esophagitis: Secondary | ICD-10-CM | POA: Diagnosis not present

## 2017-08-14 DIAGNOSIS — R05 Cough: Secondary | ICD-10-CM | POA: Diagnosis not present

## 2017-08-15 DIAGNOSIS — M5136 Other intervertebral disc degeneration, lumbar region: Secondary | ICD-10-CM | POA: Diagnosis not present

## 2017-08-15 DIAGNOSIS — S22070A Wedge compression fracture of T9-T10 vertebra, initial encounter for closed fracture: Secondary | ICD-10-CM | POA: Diagnosis not present

## 2017-08-15 DIAGNOSIS — M8580 Other specified disorders of bone density and structure, unspecified site: Secondary | ICD-10-CM | POA: Insufficient documentation

## 2017-09-04 DIAGNOSIS — R0602 Shortness of breath: Secondary | ICD-10-CM | POA: Diagnosis not present

## 2017-09-04 DIAGNOSIS — R05 Cough: Secondary | ICD-10-CM | POA: Diagnosis not present

## 2017-09-04 DIAGNOSIS — J209 Acute bronchitis, unspecified: Secondary | ICD-10-CM | POA: Diagnosis not present

## 2017-12-25 ENCOUNTER — Ambulatory Visit (INDEPENDENT_AMBULATORY_CARE_PROVIDER_SITE_OTHER): Payer: Medicare Other | Admitting: Family Medicine

## 2017-12-25 ENCOUNTER — Encounter: Payer: Self-pay | Admitting: Family Medicine

## 2017-12-25 VITALS — BP 110/64 | HR 99 | Temp 97.9°F | Ht 68.0 in | Wt 221.0 lb

## 2017-12-25 DIAGNOSIS — E785 Hyperlipidemia, unspecified: Secondary | ICD-10-CM

## 2017-12-25 DIAGNOSIS — Z23 Encounter for immunization: Secondary | ICD-10-CM

## 2017-12-25 DIAGNOSIS — I10 Essential (primary) hypertension: Secondary | ICD-10-CM | POA: Diagnosis not present

## 2017-12-25 DIAGNOSIS — R300 Dysuria: Secondary | ICD-10-CM

## 2017-12-25 DIAGNOSIS — Z136 Encounter for screening for cardiovascular disorders: Secondary | ICD-10-CM

## 2017-12-25 DIAGNOSIS — Z0000 Encounter for general adult medical examination without abnormal findings: Secondary | ICD-10-CM | POA: Diagnosis not present

## 2017-12-25 LAB — HEPATIC FUNCTION PANEL
ALK PHOS: 80 U/L (ref 39–117)
ALT: 27 U/L (ref 0–53)
AST: 18 U/L (ref 0–37)
Albumin: 4.5 g/dL (ref 3.5–5.2)
BILIRUBIN DIRECT: 0.2 mg/dL (ref 0.0–0.3)
Total Bilirubin: 1.6 mg/dL — ABNORMAL HIGH (ref 0.2–1.2)
Total Protein: 6.6 g/dL (ref 6.0–8.3)

## 2017-12-25 LAB — POCT URINALYSIS DIPSTICK
Glucose, UA: NEGATIVE
KETONES UA: 5
NITRITE UA: NEGATIVE
PH UA: 6 (ref 5.0–8.0)
PROTEIN UA: POSITIVE — AB
Spec Grav, UA: 1.02 (ref 1.010–1.025)
UROBILINOGEN UA: 1 U/dL

## 2017-12-25 LAB — LIPID PANEL
CHOL/HDL RATIO: 3
Cholesterol: 142 mg/dL (ref 0–200)
HDL: 41.4 mg/dL (ref >39.00)
LDL Cholesterol: 72 mg/dL (ref 0–99)
NONHDL: 100.17
Triglycerides: 141 mg/dL (ref 0.0–149.0)
VLDL: 28.2 mg/dL (ref 0.0–40.0)

## 2017-12-25 LAB — CBC WITH DIFFERENTIAL/PLATELET
BASOS PCT: 0.7 % (ref 0.0–3.0)
Basophils Absolute: 0.1 10*3/uL (ref 0.0–0.1)
EOS PCT: 1.1 % (ref 0.0–5.0)
Eosinophils Absolute: 0.1 10*3/uL (ref 0.0–0.7)
HCT: 44.3 % (ref 39.0–52.0)
Hemoglobin: 15.1 g/dL (ref 13.0–17.0)
Lymphocytes Relative: 13.5 % (ref 12.0–46.0)
Lymphs Abs: 1 10*3/uL (ref 0.7–4.0)
MCHC: 34 g/dL (ref 30.0–36.0)
MCV: 96.2 fl (ref 78.0–100.0)
MONO ABS: 0.6 10*3/uL (ref 0.1–1.0)
MONOS PCT: 8.2 % (ref 3.0–12.0)
NEUTROS ABS: 5.7 10*3/uL (ref 1.4–7.7)
NEUTROS PCT: 76.5 % (ref 43.0–77.0)
PLATELETS: 376 10*3/uL (ref 150.0–400.0)
RBC: 4.61 Mil/uL (ref 4.22–5.81)
RDW: 15.3 % (ref 11.5–15.5)
WBC: 7.4 10*3/uL (ref 4.0–10.5)

## 2017-12-25 LAB — BASIC METABOLIC PANEL
BUN: 21 mg/dL (ref 6–23)
CO2: 26 meq/L (ref 19–32)
Calcium: 9.4 mg/dL (ref 8.4–10.5)
Chloride: 100 mEq/L (ref 96–112)
Creatinine, Ser: 1.03 mg/dL (ref 0.40–1.50)
GFR: 74.24 mL/min (ref >60.00)
Glucose, Bld: 92 mg/dL (ref 70–99)
Potassium: 4.2 mEq/L (ref 3.5–5.1)
Sodium: 140 mEq/L (ref 135–145)

## 2017-12-25 LAB — TSH: TSH: 6.45 u[IU]/mL — AB (ref 0.35–4.50)

## 2017-12-25 MED ORDER — SULFAMETHOXAZOLE-TRIMETHOPRIM 800-160 MG PO TABS: 1.0000 | ORAL_TABLET | Freq: Two times a day (BID) | ORAL | 1 refills | Status: DC

## 2017-12-25 MED ORDER — HYDROCHLOROTHIAZIDE 12.5 MG PO TABS: ORAL_TABLET | ORAL | 4 refills | Status: DC

## 2017-12-25 MED ORDER — ATORVASTATIN CALCIUM 40 MG PO TABS: 40.0000 mg | ORAL_TABLET | Freq: Every day | ORAL | 4 refills | Status: DC

## 2017-12-25 MED ORDER — OMEPRAZOLE 20 MG PO CPDR: 20.0000 mg | DELAYED_RELEASE_CAPSULE | Freq: Every day | ORAL | 4 refills | Status: DC

## 2017-12-25 NOTE — Progress Notes (Signed)
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  BP today 110/64  He gets routine eye care, dental care, colonoscopy was done 2014 was normal  Vaccination seasonal flu shot given today.  Information given on the new shingles vaccine however he said he had a side effect from the first shingles vaccine and had some "nerve damage".  I have asked him to discuss that with the folks to do the vaccinations if he indeed should have the new shingles vaccine  He is having difficulty with urination.  He notes a dark color and a foul odor to his urine starting on Sunday.  Urinalysis today shows numerous white cells.  Proteinuria.  He said his prostate removed 20+ years ago for prostate cancer.  Asymptomatic.  Cognitive function normal he walks daily home health safety reviewed no issues identified, no guns in the house, he does have a healthcare power of attorney and living well.  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  14 point review of system reviewed and otherwise negative except for his urinary difficulties as noted above  BP 110/64 (BP Location: Right Arm, Patient Position: Sitting, Cuff Size: Large)   Pulse 99   Temp 97.9 F (36.6 C) (Oral)   Ht 5\' 8"  (1.727 m)   Wt 221 lb (100.2 kg)   SpO2 96%   BMI 33.60 kg/m  Well-developed well-nourished male no acute distress vital signs stable is afebrile HEENT were negative except for bilateral cataracts and cerumen impaction right ear.  Neck  was supple thyroid is not enlarged no carotid bruits.  Cardiopulmonary exam normal.  Abdominal exam normal.  Genitalia normal circumcised male.  Rectal normal.......Marland Kitchen prostate surgically removed.  Guaiac negative.   Extremities normal skin normal peripheral pulses normal except for numerous seborrheic keratoses.  Scar mid lower abdomen from the umbilicus down the pubis from previous prostatectomy.  Also multiple healed stab wounds from previous cholecystectomy.  1.  Hyperlipidemia....... continue Lipitor check labs  2.  Hypertension continue hydrochlorothiazide  3.  Urinary tract infection.......... Septra DS twice daily for 10 days  4.  Bilateral cataracts  5.  Cerumen impaction right ear...........Marland Kitchen removed with irrigation

## 2017-12-25 NOTE — Patient Instructions (Signed)
Continue current medications  Labs today..........Marland Kitchen we will call if there is anything abnormal  Urinalysis shows a urinary tract infection,,,,,,,,,,,,, I have emailed in some medication called Septra DS.......... take 1 twice daily till bottle empty  Walk 30 minutes daily  Return in 1 year for general physical exam sooner if any problems,,,,,,, Netta Corrigan her new adult Designer, jewellery

## 2017-12-26 DIAGNOSIS — Z23 Encounter for immunization: Secondary | ICD-10-CM | POA: Diagnosis not present

## 2017-12-26 DIAGNOSIS — R300 Dysuria: Secondary | ICD-10-CM | POA: Diagnosis not present

## 2017-12-26 DIAGNOSIS — Z0000 Encounter for general adult medical examination without abnormal findings: Secondary | ICD-10-CM

## 2017-12-26 DIAGNOSIS — I10 Essential (primary) hypertension: Secondary | ICD-10-CM | POA: Diagnosis not present

## 2018-01-01 ENCOUNTER — Telehealth: Payer: Self-pay | Admitting: Family Medicine

## 2018-01-01 ENCOUNTER — Other Ambulatory Visit: Payer: Self-pay | Admitting: Family Medicine

## 2018-01-01 DIAGNOSIS — R7989 Other specified abnormal findings of blood chemistry: Secondary | ICD-10-CM

## 2018-01-01 NOTE — Telephone Encounter (Signed)
Pt notified of lab results from 12/25/17.  Additional labs ordered.  Pt scheduled for 01/02/18 @ 10 AM.  Pt notified he does not need to fast.

## 2018-01-02 ENCOUNTER — Other Ambulatory Visit (INDEPENDENT_AMBULATORY_CARE_PROVIDER_SITE_OTHER): Payer: Medicare Other

## 2018-01-02 DIAGNOSIS — R946 Abnormal results of thyroid function studies: Secondary | ICD-10-CM | POA: Diagnosis not present

## 2018-01-02 DIAGNOSIS — R7989 Other specified abnormal findings of blood chemistry: Secondary | ICD-10-CM | POA: Diagnosis not present

## 2018-01-02 LAB — T3, FREE: T3 FREE: 2.8 pg/mL (ref 2.3–4.2)

## 2018-01-02 LAB — T4, FREE: FREE T4: 0.71 ng/dL (ref 0.60–1.60)

## 2018-01-15 ENCOUNTER — Encounter: Payer: Self-pay | Admitting: Gastroenterology

## 2018-01-16 ENCOUNTER — Other Ambulatory Visit: Payer: Self-pay | Admitting: Family Medicine

## 2018-01-16 DIAGNOSIS — R300 Dysuria: Secondary | ICD-10-CM

## 2018-03-22 ENCOUNTER — Other Ambulatory Visit: Payer: Self-pay | Admitting: *Deleted

## 2018-03-22 DIAGNOSIS — E785 Hyperlipidemia, unspecified: Secondary | ICD-10-CM

## 2018-03-22 DIAGNOSIS — Z0000 Encounter for general adult medical examination without abnormal findings: Secondary | ICD-10-CM

## 2018-03-22 MED ORDER — ATORVASTATIN CALCIUM 40 MG PO TABS: 40.0000 mg | ORAL_TABLET | Freq: Every day | ORAL | 0 refills | Status: DC

## 2018-03-22 MED ORDER — OMEPRAZOLE 20 MG PO CPDR: 20.0000 mg | DELAYED_RELEASE_CAPSULE | Freq: Every day | ORAL | 0 refills | Status: DC

## 2018-06-18 ENCOUNTER — Telehealth: Payer: Self-pay | Admitting: *Deleted

## 2018-06-18 NOTE — Telephone Encounter (Signed)
Left message for patient to return our call.  Patient will need to schedule a TOC for further refills. CRM

## 2018-12-28 ENCOUNTER — Telehealth: Payer: Self-pay

## 2018-12-28 NOTE — Telephone Encounter (Signed)

## 2018-12-31 ENCOUNTER — Encounter: Payer: Self-pay | Admitting: Family Medicine

## 2018-12-31 ENCOUNTER — Ambulatory Visit (INDEPENDENT_AMBULATORY_CARE_PROVIDER_SITE_OTHER): Payer: Medicare Other | Admitting: Family Medicine

## 2018-12-31 VITALS — BP 130/80 | HR 94 | Ht 68.0 in | Wt 212.0 lb

## 2018-12-31 DIAGNOSIS — Z8546 Personal history of malignant neoplasm of prostate: Secondary | ICD-10-CM | POA: Diagnosis not present

## 2018-12-31 DIAGNOSIS — R7989 Other specified abnormal findings of blood chemistry: Secondary | ICD-10-CM | POA: Diagnosis not present

## 2018-12-31 DIAGNOSIS — Z23 Encounter for immunization: Secondary | ICD-10-CM

## 2018-12-31 DIAGNOSIS — E785 Hyperlipidemia, unspecified: Secondary | ICD-10-CM

## 2018-12-31 DIAGNOSIS — I10 Essential (primary) hypertension: Secondary | ICD-10-CM

## 2018-12-31 DIAGNOSIS — E039 Hypothyroidism, unspecified: Secondary | ICD-10-CM

## 2018-12-31 LAB — MICROALBUMIN / CREATININE URINE RATIO
Creatinine,U: 235.5 mg/dL
Microalb Creat Ratio: 0.6 mg/g (ref 0.0–30.0)
Microalb, Ur: 1.4 mg/dL (ref 0.0–1.9)

## 2018-12-31 LAB — LIPID PANEL
Cholesterol: 137 mg/dL (ref 0–200)
HDL: 40.4 mg/dL (ref >39.00)
LDL Cholesterol: 70 mg/dL (ref 0–99)
NonHDL: 96.45
Total CHOL/HDL Ratio: 3
Triglycerides: 134 mg/dL (ref 0.0–149.0)
VLDL: 26.8 mg/dL (ref 0.0–40.0)

## 2018-12-31 LAB — TSH: TSH: 4.6 u[IU]/mL — ABNORMAL HIGH (ref 0.35–4.50)

## 2018-12-31 LAB — URINALYSIS, ROUTINE W REFLEX MICROSCOPIC
Bilirubin Urine: NEGATIVE
Hgb urine dipstick: NEGATIVE
Ketones, ur: NEGATIVE
Leukocytes,Ua: NEGATIVE
Nitrite: NEGATIVE
Specific Gravity, Urine: 1.02 (ref 1.000–1.030)
Total Protein, Urine: NEGATIVE
Urine Glucose: NEGATIVE
Urobilinogen, UA: 0.2 (ref 0.0–1.0)
pH: 6.5 (ref 5.0–8.0)

## 2018-12-31 LAB — COMPREHENSIVE METABOLIC PANEL
ALT: 29 U/L (ref 0–53)
AST: 21 U/L (ref 0–37)
Albumin: 4.6 g/dL (ref 3.5–5.2)
Alkaline Phosphatase: 75 U/L (ref 39–117)
BUN: 17 mg/dL (ref 6–23)
CO2: 28 mEq/L (ref 19–32)
Calcium: 9.4 mg/dL (ref 8.4–10.5)
Chloride: 103 mEq/L (ref 96–112)
Creatinine, Ser: 1.05 mg/dL (ref 0.40–1.50)
GFR: 68.14 mL/min (ref >60.00)
Glucose, Bld: 93 mg/dL (ref 70–99)
Potassium: 4.1 mEq/L (ref 3.5–5.1)
Sodium: 142 mEq/L (ref 135–145)
Total Bilirubin: 1.3 mg/dL — ABNORMAL HIGH (ref 0.2–1.2)
Total Protein: 6.4 g/dL (ref 6.0–8.3)

## 2018-12-31 LAB — CBC
HCT: 43.6 % (ref 39.0–52.0)
Hemoglobin: 15.1 g/dL (ref 13.0–17.0)
MCHC: 34.6 g/dL (ref 30.0–36.0)
MCV: 96.3 fl (ref 78.0–100.0)
Platelets: 395 10*3/uL (ref 150.0–400.0)
RBC: 4.53 Mil/uL (ref 4.22–5.81)
RDW: 15.9 % — ABNORMAL HIGH (ref 11.5–15.5)
WBC: 5.7 10*3/uL (ref 4.0–10.5)

## 2018-12-31 LAB — LDL CHOLESTEROL, DIRECT: Direct LDL: 82 mg/dL

## 2018-12-31 LAB — PSA: PSA: 0.26 ng/mL (ref 0.10–4.00)

## 2018-12-31 NOTE — Patient Instructions (Signed)
Hypothyroidism  Hypothyroidism is when the thyroid gland does not make enough of certain hormones (it is underactive). The thyroid gland is a small gland located in the lower front part of the neck, just in front of the windpipe (trachea). This gland makes hormones that help control how the body uses food for energy (metabolism) as well as how the heart and brain function. These hormones also play a role in keeping your bones strong. When the thyroid is underactive, it produces too little of the hormones thyroxine (T4) and triiodothyronine (T3). What are the causes? This condition may be caused by:  Hashimoto's disease. This is a disease in which the body's disease-fighting system (immune system) attacks the thyroid gland. This is the most common cause.  Viral infections.  Pregnancy.  Certain medicines.  Birth defects.  Past radiation treatments to the head or neck for cancer.  Past treatment with radioactive iodine.  Past exposure to radiation in the environment.  Past surgical removal of part or all of the thyroid.  Problems with a gland in the center of the brain (pituitary gland).  Lack of enough iodine in the diet. What increases the risk? You are more likely to develop this condition if:  You are male.  You have a family history of thyroid conditions.  You use a medicine called lithium.  You take medicines that affect the immune system (immunosuppressants). What are the signs or symptoms? Symptoms of this condition include:  Feeling as though you have no energy (lethargy).  Not being able to tolerate cold.  Weight gain that is not explained by a change in diet or exercise habits.  Lack of appetite.  Dry skin.  Coarse hair.  Menstrual irregularity.  Slowing of thought processes.  Constipation.  Sadness or depression. How is this diagnosed? This condition may be diagnosed based on:  Your symptoms, your medical history, and a physical exam.  Blood  tests. You may also have imaging tests, such as an ultrasound or MRI. How is this treated? This condition is treated with medicine that replaces the thyroid hormones that your body does not make. After you begin treatment, it may take several weeks for symptoms to go away. Follow these instructions at home:  Take over-the-counter and prescription medicines only as told by your health care provider.  If you start taking any new medicines, tell your health care provider.  Keep all follow-up visits as told by your health care provider. This is important. ? As your condition improves, your dosage of thyroid hormone medicine may change. ? You will need to have blood tests regularly so that your health care provider can monitor your condition. Contact a health care provider if:  Your symptoms do not get better with treatment.  You are taking thyroid replacement medicine and you: ? Sweat a lot. ? Have tremors. ? Feel anxious. ? Lose weight rapidly. ? Cannot tolerate heat. ? Have emotional swings. ? Have diarrhea. ? Feel weak. Get help right away if you have:  Chest pain.  An irregular heartbeat.  A rapid heartbeat.  Difficulty breathing. Summary  Hypothyroidism is when the thyroid gland does not make enough of certain hormones (it is underactive).  When the thyroid is underactive, it produces too little of the hormones thyroxine (T4) and triiodothyronine (T3).  The most common cause is Hashimoto's disease, a disease in which the body's disease-fighting system (immune system) attacks the thyroid gland. The condition can also be caused by viral infections, medicine, pregnancy, or past   radiation treatment to the head or neck.  Symptoms may include weight gain, dry skin, constipation, feeling as though you do not have energy, and not being able to tolerate cold.  This condition is treated with medicine to replace the thyroid hormones that your body does not make. This information  is not intended to replace advice given to you by your health care provider. Make sure you discuss any questions you have with your health care provider. Document Released: 03/07/2005 Document Revised: 02/17/2017 Document Reviewed: 02/15/2017 Elsevier Patient Education  2020 Elsevier Inc.  

## 2018-12-31 NOTE — Progress Notes (Addendum)
Established Patient Office Visit  Subjective:  Patient ID: Travis Schwartz, male    DOB: 09/08/1940  Age: 78 y.o. MRN: [id number]  CC:  Chief Complaint  Patient presents with  . Establish Care    HPI Travis Schwartz presents for establishment of care by way of transfer because his doctor retired.  History of hypertension, elevated cholesterol, GERD, prostate cancer.  Blood pressure has been controlled well by the low-dose HCTZ.  Cholesterol has responded to the Lipitor.  He is having no issues taking these medicines. Prostate was removed years ago for prostate cancer his urine flow has been good.   gerd symptoms have been controlled with his omeprazole.  He is moving his bowels regularly.  He did see his eye physician this year.  He is status post cataract extraction in both eyes.  Has not seen his dentist this year yet but is planning to follow-up.  He does not exercise regularly.  He lives at home with his wife.  He rarely drinks alcohol and quit smoking many years ago.  Chart review shows an elevated TSH from last year.  He is having no hair loss, constipation or cold sensitivity.  Past Medical History:  Diagnosis Date  . Allergy    no per pt  . Arthritis   . Cancer [facility])    prostate  . Diverticulosis    2008  . GERD (gastroesophageal reflux disease)   . Hiatal hernia   . Hyperlipidemia   . Prostate cancer ([place]) 1992    Past Surgical History:  Procedure Laterality Date  . CATARACT EXTRACTION     both eyes  . CHOLECYSTECTOMY    . COLONOSCOPY    . TONSILLECTOMY    . TRANSURETHRAL RESECTION OF PROSTATE    . UPPER GASTROINTESTINAL ENDOSCOPY      Family History  Problem Relation Age of Onset  . Cancer Sister        uterine  . Colon cancer Mother   . Hyperlipidemia Other   . COPD Other        colon  . Esophageal cancer Neg Hx   . Rectal cancer Neg Hx   . Stomach cancer Neg Hx     Social History   Socioeconomic History  . Marital status: Married    Spouse name:  Not on file  . Number of children: Not on file  . Years of education: Not on file  . Highest education level: Not on file  Occupational History  . Not on file  Social Needs  . Financial resource strain: Not on file  . Food insecurity    Worry: Not on file    Inability: Not on file  . Transportation needs    Medical: Not on file    Non-medical: Not on file  Tobacco Use  . Smoking status: Former Smoker    Packs/day: 2.00    Years: 3.00    Pack years: 6.00    Types: Cigarettes    Quit date: 03/22/1963    Years since quitting: 55.8  . Smokeless tobacco: Former Network engineer and Sexual Activity  . Alcohol use: Yes    Alcohol/week: 0.0 standard drinks    Comment: rarely  . Drug use: No  . Sexual activity: Not on file  Lifestyle  . Physical activity    Days per week: Not on file    Minutes per session: Not on file  . Stress: Not on file  Relationships  . Social connections  Talks on phone: Not on file    Gets together: Not on file    Attends religious service: Not on file    Active member of club or organization: Not on file    Attends meetings of clubs or organizations: Not on file    Relationship status: Not on file  . Intimate partner violence    Fear of current or ex partner: Not on file    Emotionally abused: Not on file    Physically abused: Not on file    Forced sexual activity: Not on file  Other Topics Concern  . Not on file  Social History Narrative   Lives with wife, son, and granddaughter in a 2 story home.  1 child.     Previously worked as a Administrator for 30 years.     High school education.    Outpatient Medications Prior to Visit  Medication Sig Dispense Refill  . atorvastatin (LIPITOR) 40 MG tablet Take 1 tablet (40 mg total) by mouth daily. Please establish with a new doctor for more refills 90 tablet 0  . hydrochlorothiazide (HYDRODIURIL) 12.5 MG tablet TAKE 1 TABLET(12.5 MG) BY MOUTH DAILY 90 tablet 4  . omeprazole (PRILOSEC) 20 MG capsule  Take 1 capsule (20 mg total) by mouth daily. Please establish with a new doctor for more refills 100 capsule 0  . sulfamethoxazole-trimethoprim (BACTRIM DS,SEPTRA DS) 800-160 MG tablet Take 1 tablet by mouth 2 (two) times daily. 20 tablet 1   No facility-administered medications prior to visit.     No Known Allergies  ROS Review of Systems  Constitutional: Negative.   HENT: Negative.   Eyes: Negative for photophobia and visual disturbance.  Respiratory: Negative.   Cardiovascular: Negative.   Gastrointestinal: Negative.   Endocrine: Negative for polyphagia and polyuria.  Genitourinary: Negative for difficulty urinating, frequency and urgency.  Musculoskeletal: Negative for gait problem and joint swelling.  Skin: Negative for pallor.  Allergic/Immunologic: Negative for immunocompromised state.  Neurological: Negative for seizures and numbness.  Hematological: Does not bruise/bleed easily.  Psychiatric/Behavioral: Negative.       Objective:    Physical Exam  Constitutional: He is oriented to person, place, and time. He appears well-developed and well-nourished. No distress.  HENT:  Head: Normocephalic and atraumatic.  Right Ear: External ear normal.  Left Ear: External ear normal.  Mouth/Throat: Oropharynx is clear and moist. No oropharyngeal exudate.  Eyes: Conjunctivae are normal. Right eye exhibits no discharge. Left eye exhibits no discharge. No scleral icterus.  Neck: Neck supple. No JVD present. No tracheal deviation present. No thyromegaly present.  Cardiovascular: Normal rate, regular rhythm and normal heart sounds.  Pulmonary/Chest: Effort normal and breath sounds normal. No stridor.  Abdominal: Soft. Bowel sounds are normal. He exhibits no distension. There is no abdominal tenderness. There is no rebound and no guarding.  Musculoskeletal:        General: No edema.  Lymphadenopathy:    He has no cervical adenopathy.  Neurological: He is alert and oriented to  person, place, and time.  Skin: Skin is warm and dry. He is not diaphoretic.  Psychiatric: He has a normal mood and affect.    BP 130/80   Pulse 94   Ht 5\' 8"  (1.727 m)   Wt 212 lb (96.2 kg)   SpO2 98%   BMI 32.23 kg/m  Wt Readings from Last 3 Encounters:  12/31/18 212 lb (96.2 kg)  12/25/17 221 lb (100.2 kg)  12/19/16 209 lb (94.8 kg)  BP Readings from Last 3 Encounters:  12/31/18 130/80  12/25/17 110/64  12/19/16 132/80   Guideline developer:  UpToDate (see UpToDate for funding source) Date Released: June 2014  Health Maintenance Due  Topic Date Due  . COLONOSCOPY  01/01/2018    There are no preventive care reminders to display for this patient.  Lab Results  Component Value Date   TSH 4.60 (H) 12/31/2018   Lab Results  Component Value Date   WBC 5.7 12/31/2018   HGB 15.1 12/31/2018   HCT 43.6 12/31/2018   MCV 96.3 12/31/2018   PLT 395.0 12/31/2018   Lab Results  Component Value Date   NA 142 12/31/2018   K 4.1 12/31/2018   CO2 28 12/31/2018   GLUCOSE 93 12/31/2018   BUN 17 12/31/2018   CREATININE 1.05 12/31/2018   BILITOT 1.3 (H) 12/31/2018   ALKPHOS 75 12/31/2018   AST 21 12/31/2018   ALT 29 12/31/2018   PROT 6.4 12/31/2018   ALBUMIN 4.6 12/31/2018   CALCIUM 9.4 12/31/2018   GFR 68.14 12/31/2018   Lab Results  Component Value Date   CHOL 137 12/31/2018   Lab Results  Component Value Date   HDL 40.40 12/31/2018   Lab Results  Component Value Date   LDLCALC 70 12/31/2018   Lab Results  Component Value Date   TRIG 134.0 12/31/2018   Lab Results  Component Value Date   CHOLHDL 3 12/31/2018   No results found for: HGBA1C    Assessment & Plan:   Problem List Items Addressed This Visit      Cardiovascular and Mediastinum   Essential hypertension   Relevant Orders   CBC (Completed)   Comprehensive metabolic panel (Completed)   Urinalysis, Routine w reflex microscopic (Completed)   Microalbumin / creatinine urine ratio  (Completed)     Endocrine   Acquired hypothyroidism   Relevant Medications   levothyroxine (SYNTHROID) 50 MCG tablet     Other   Hyperlipidemia   Relevant Orders   LDL cholesterol, direct (Completed)   Lipid panel (Completed)   History of prostate cancer   Relevant Orders   PSA (Completed)   Abnormal TSH   Relevant Orders   TSH (Completed)   Need for influenza vaccination - Primary   Relevant Orders   Flu Vaccine QUAD High Dose(Fluad) (Completed)      Meds ordered this encounter  Medications  . levothyroxine (SYNTHROID) 50 MCG tablet    Sig: Take 1 tablet (50 mcg total) by mouth daily before breakfast. One hour prior to eating.    Dispense:  90 tablet    Refill:  0    Follow-up: Return in about 3 months (around 04/02/2019), or if symptoms worsen or fail to improve.   Patient was encouraged to start walking for exercise.  He was given information on hypothyroidism.  Discussed taking thyroid medicine in the morning an hour before eating.

## 2019-01-01 DIAGNOSIS — E039 Hypothyroidism, unspecified: Secondary | ICD-10-CM | POA: Insufficient documentation

## 2019-01-01 MED ORDER — LEVOTHYROXINE SODIUM 50 MCG PO TABS: 50.0000 ug | ORAL_TABLET | Freq: Every day | ORAL | 0 refills | Status: DC

## 2019-01-01 NOTE — Addendum Note (Signed)
Addended by: Jon Billings on: 01/01/2019 03:47 PM   Modules accepted: Orders

## 2019-03-21 ENCOUNTER — Other Ambulatory Visit: Payer: Self-pay | Admitting: Family Medicine

## 2019-03-21 DIAGNOSIS — Z0000 Encounter for general adult medical examination without abnormal findings: Secondary | ICD-10-CM

## 2019-03-21 DIAGNOSIS — E039 Hypothyroidism, unspecified: Secondary | ICD-10-CM

## 2019-03-21 DIAGNOSIS — I10 Essential (primary) hypertension: Secondary | ICD-10-CM

## 2019-03-21 MED ORDER — HYDROCHLOROTHIAZIDE 12.5 MG PO TABS: ORAL_TABLET | ORAL | 1 refills | Status: DC

## 2019-03-21 MED ORDER — LEVOTHYROXINE SODIUM 50 MCG PO TABS: 50.0000 ug | ORAL_TABLET | Freq: Every day | ORAL | 0 refills | Status: DC

## 2019-03-21 MED ORDER — OMEPRAZOLE 20 MG PO CPDR: 20.0000 mg | DELAYED_RELEASE_CAPSULE | Freq: Every day | ORAL | 0 refills | Status: DC

## 2019-03-21 NOTE — Telephone Encounter (Signed)
Medication Refill - Medication: hydrochlorothiazide (HYDRODIURIL) 12.5 MG tablet [id number]   omeprazole (PRILOSEC) 20 MG capsule [id number]   atorvastatin (LIPITOR) 40 MG tablet [id number]   Preferred Pharmacy (with phone number or street name):  [id number] - [place], [place] - [address]  [address] [place] [address]  Phone: [phone] Fax: [phone]     Agent: Please be advised that RX refills may take up to 3 business days. We ask that you follow-up with your pharmacy.

## 2019-03-26 ENCOUNTER — Other Ambulatory Visit: Payer: Self-pay | Admitting: Family Medicine

## 2019-03-26 DIAGNOSIS — E785 Hyperlipidemia, unspecified: Secondary | ICD-10-CM

## 2019-03-26 MED ORDER — ATORVASTATIN CALCIUM 40 MG PO TABS: 40.0000 mg | ORAL_TABLET | Freq: Every day | ORAL | 0 refills | Status: DC

## 2019-03-26 NOTE — Telephone Encounter (Signed)
Medication Refill - Medication: atorvastatin (LIPITOR) 40 MG tablet    Preferred Pharmacy (with phone number or street name):  [id number] - [place], [place] - [address] AT [place] Phone:  [phone]  Fax:  [phone]       Agent: Please be advised that RX refills may take up to 3 business days. We ask that you follow-up with your pharmacy.

## 2019-05-01 ENCOUNTER — Other Ambulatory Visit: Payer: Self-pay

## 2019-05-02 ENCOUNTER — Ambulatory Visit (INDEPENDENT_AMBULATORY_CARE_PROVIDER_SITE_OTHER): Payer: Medicare Other | Admitting: Family Medicine

## 2019-05-02 ENCOUNTER — Encounter: Payer: Self-pay | Admitting: Family Medicine

## 2019-05-02 ENCOUNTER — Ambulatory Visit (INDEPENDENT_AMBULATORY_CARE_PROVIDER_SITE_OTHER): Payer: Medicare Other

## 2019-05-02 VITALS — BP 138/74 | HR 70 | Temp 97.0°F | Ht 68.0 in | Wt 220.2 lb

## 2019-05-02 DIAGNOSIS — M25552 Pain in left hip: Secondary | ICD-10-CM | POA: Diagnosis not present

## 2019-05-02 DIAGNOSIS — M1612 Unilateral primary osteoarthritis, left hip: Secondary | ICD-10-CM | POA: Diagnosis not present

## 2019-05-02 DIAGNOSIS — E039 Hypothyroidism, unspecified: Secondary | ICD-10-CM

## 2019-05-02 MED ORDER — DICLOFENAC SODIUM 1 % EX GEL: CUTANEOUS | 0 refills | Status: DC

## 2019-05-02 NOTE — Addendum Note (Signed)
Addended by: Lynnea Ferrier on: 05/02/2019 03:09 PM   Modules accepted: Orders

## 2019-05-02 NOTE — Progress Notes (Addendum)
Established Patient Office Visit  Subjective:  Patient ID: Travis Schwartz, male    DOB: November 13, 1940  Age: 79 y.o. MRN: [id number]  CC:  Chief Complaint  Patient presents with  . Pain    left side of hip painful x 1 week, right hip pain come and go     HPI Travis Schwartz presents for follow-up of his hypothyroidism.  He is taking the Synthroid first thing in the morning before eating his breakfast.  Having no trouble taking the pill.  Status post second Covid vaccine a week ago and had no issue with that.  For the last week or so he has been having left lateral hip pain.  He has tried icy hot and capsaicin without much relief.  There is some pain with ambulation.  Denies back pain.  Denies pain in his groin area.  Past Medical History:  Diagnosis Date  . Allergy    no per pt  . Arthritis   . Cancer [facility])    prostate  . Diverticulosis    2008  . GERD (gastroesophageal reflux disease)   . Hiatal hernia   . Hyperlipidemia   . Prostate cancer ([place]) 1992    Past Surgical History:  Procedure Laterality Date  . CATARACT EXTRACTION     both eyes  . CHOLECYSTECTOMY    . COLONOSCOPY    . TONSILLECTOMY    . TRANSURETHRAL RESECTION OF PROSTATE    . UPPER GASTROINTESTINAL ENDOSCOPY      Family History  Problem Relation Age of Onset  . Cancer Sister        uterine  . Colon cancer Mother   . Hyperlipidemia Other   . COPD Other        colon  . Esophageal cancer Neg Hx   . Rectal cancer Neg Hx   . Stomach cancer Neg Hx     Social History   Socioeconomic History  . Marital status: Married    Spouse name: Not on file  . Number of children: Not on file  . Years of education: Not on file  . Highest education level: Not on file  Occupational History  . Not on file  Tobacco Use  . Smoking status: Former Smoker    Packs/day: 2.00    Years: 3.00    Pack years: 6.00    Types: Cigarettes    Quit date: 03/22/1963    Years since quitting: 56.1  . Smokeless tobacco: Former  Network engineer and Sexual Activity  . Alcohol use: Yes    Alcohol/week: 0.0 standard drinks    Comment: rarely  . Drug use: No  . Sexual activity: Not on file  Other Topics Concern  . Not on file  Social History Narrative   Lives with wife, son, and granddaughter in a 2 story home.  1 child.     Previously worked as a Administrator for 30 years.     High school education.   Social Determinants of Health   Financial Resource Strain:   . Difficulty of Paying Living Expenses: Not on file  Food Insecurity:   . Worried About Charity fundraiser in the Last Year: Not on file  . Ran Out of Food in the Last Year: Not on file  Transportation Needs:   . Lack of Transportation (Medical): Not on file  . Lack of Transportation (Non-Medical): Not on file  Physical Activity:   . Days of Exercise per Week: Not on file  .  Minutes of Exercise per Session: Not on file  Stress:   . Feeling of Stress : Not on file  Social Connections:   . Frequency of Communication with Friends and Family: Not on file  . Frequency of Social Gatherings with Friends and Family: Not on file  . Attends Religious Services: Not on file  . Active Member of Clubs or Organizations: Not on file  . Attends Archivist Meetings: Not on file  . Marital Status: Not on file  Intimate Partner Violence:   . Fear of Current or Ex-Partner: Not on file  . Emotionally Abused: Not on file  . Physically Abused: Not on file  . Sexually Abused: Not on file    Outpatient Medications Prior to Visit  Medication Sig Dispense Refill  . atorvastatin (LIPITOR) 40 MG tablet Take 1 tablet (40 mg total) by mouth daily. Please establish with a new doctor for more refills 90 tablet 0  . hydrochlorothiazide (HYDRODIURIL) 12.5 MG tablet TAKE 1 TABLET(12.5 MG) BY MOUTH DAILY 90 tablet 1  . omeprazole (PRILOSEC) 20 MG capsule Take 1 capsule (20 mg total) by mouth daily. Please establish with a new doctor for more refills 100 capsule 0  .  levothyroxine (SYNTHROID) 50 MCG tablet Take 1 tablet (50 mcg total) by mouth daily before breakfast. One hour prior to eating. 90 tablet 0   No facility-administered medications prior to visit.    No Known Allergies  ROS Review of Systems  HENT: Negative.   Respiratory: Negative.   Cardiovascular: Negative.   Gastrointestinal: Negative.   Endocrine: Negative for cold intolerance and heat intolerance.  Musculoskeletal: Positive for arthralgias and gait problem.  Psychiatric/Behavioral: Negative.       Objective:    Physical Exam  Constitutional: He is oriented to person, place, and time. He appears well-developed and well-nourished. No distress.  HENT:  Head: Normocephalic and atraumatic.  Right Ear: External ear normal.  Left Ear: External ear normal.  Eyes: Right eye exhibits no discharge. Left eye exhibits no discharge. No scleral icterus.  Pulmonary/Chest: Effort normal.  Musculoskeletal:     Lumbar back: No tenderness or bony tenderness. Normal range of motion.     Right hip: No tenderness. Normal range of motion. Normal strength.     Left hip: No tenderness. Normal range of motion. Normal strength.       Legs:  Neurological: He is alert and oriented to person, place, and time.  Skin: Skin is warm and dry. He is not diaphoretic.  Psychiatric: He has a normal mood and affect. His behavior is normal.    BP 138/74   Pulse 70   Temp (!) 97 F (36.1 C) (Tympanic)   Ht 5\' 8"  (1.727 m)   Wt 220 lb 3.2 oz (99.9 kg)   SpO2 94%   BMI 33.48 kg/m  Wt Readings from Last 3 Encounters:  05/02/19 220 lb 3.2 oz (99.9 kg)  12/31/18 212 lb (96.2 kg)  12/25/17 221 lb (100.2 kg)     Health Maintenance Due  Topic Date Due  . COLONOSCOPY  01/01/2018    There are no preventive care reminders to display for this patient.  Lab Results  Component Value Date   TSH 4.93 (H) 05/02/2019   Lab Results  Component Value Date   WBC 5.7 12/31/2018   HGB 15.1 12/31/2018   HCT  43.6 12/31/2018   MCV 96.3 12/31/2018   PLT 395.0 12/31/2018   Lab Results  Component Value Date  NA 142 12/31/2018   K 4.1 12/31/2018   CO2 28 12/31/2018   GLUCOSE 93 12/31/2018   BUN 17 12/31/2018   CREATININE 1.05 12/31/2018   BILITOT 1.3 (H) 12/31/2018   ALKPHOS 75 12/31/2018   AST 21 12/31/2018   ALT 29 12/31/2018   PROT 6.4 12/31/2018   ALBUMIN 4.6 12/31/2018   CALCIUM 9.4 12/31/2018   GFR 68.14 12/31/2018   Lab Results  Component Value Date   CHOL 137 12/31/2018   Lab Results  Component Value Date   HDL 40.40 12/31/2018   Lab Results  Component Value Date   LDLCALC 70 12/31/2018   Lab Results  Component Value Date   TRIG 134.0 12/31/2018   Lab Results  Component Value Date   CHOLHDL 3 12/31/2018   No results found for: HGBA1C    Assessment & Plan:   Problem List Items Addressed This Visit      Endocrine   Acquired hypothyroidism - Primary   Relevant Medications   levothyroxine (SYNTHROID) 75 MCG tablet   Other Relevant Orders   TSH (Completed)     Other   Left hip pain   Relevant Medications   diclofenac Sodium (VOLTAREN) 1 % GEL   Other Relevant Orders   DG Hip Unilat W OR W/O Pelvis 2-3 Views Left (Completed)      Meds ordered this encounter  Medications  . diclofenac Sodium (VOLTAREN) 1 % GEL    Sig: May apply a dab up to 4 times daily.    Dispense:  150 g    Refill:  0  . levothyroxine (SYNTHROID) 75 MCG tablet    Sig: Take 1 tablet (75 mcg total) by mouth daily before breakfast.    Dispense:  90 tablet    Refill:  0    Follow-up: Return in about 3 months (around 07/30/2019), or if symptoms worsen or fail to improve.    Libby Maw, MD

## 2019-05-03 LAB — TSH: TSH: 4.93 u[IU]/mL — ABNORMAL HIGH (ref 0.35–4.50)

## 2019-05-06 MED ORDER — LEVOTHYROXINE SODIUM 75 MCG PO TABS: 75.0000 ug | ORAL_TABLET | Freq: Every day | ORAL | 0 refills | Status: DC

## 2019-05-06 NOTE — Addendum Note (Signed)
Addended by: Jon Billings on: 05/06/2019 11:14 AM   Modules accepted: Orders

## 2019-05-13 ENCOUNTER — Telehealth: Payer: Self-pay | Admitting: Family Medicine

## 2019-05-13 NOTE — Telephone Encounter (Signed)
Spoke with pt

## 2019-05-13 NOTE — Telephone Encounter (Signed)
Patient is calling back and stated that he haven't received his results from his hip xray. CB is [phone]

## 2019-05-15 NOTE — Telephone Encounter (Signed)
Medical records phone number given to patient for him to call and request x-ray CD

## 2019-05-15 NOTE — Telephone Encounter (Signed)
Patient called back and requested if he can get his xray results on a disk. Pls advise. CB is [phone]

## 2019-05-17 DIAGNOSIS — M7062 Trochanteric bursitis, left hip: Secondary | ICD-10-CM | POA: Diagnosis not present

## 2019-05-17 DIAGNOSIS — M25552 Pain in left hip: Secondary | ICD-10-CM | POA: Diagnosis not present

## 2019-05-27 DIAGNOSIS — M25552 Pain in left hip: Secondary | ICD-10-CM | POA: Diagnosis not present

## 2019-06-04 DIAGNOSIS — M25552 Pain in left hip: Secondary | ICD-10-CM | POA: Diagnosis not present

## 2019-06-10 ENCOUNTER — Other Ambulatory Visit: Payer: Self-pay | Admitting: Family Medicine

## 2019-06-10 DIAGNOSIS — Z0000 Encounter for general adult medical examination without abnormal findings: Secondary | ICD-10-CM

## 2019-06-10 DIAGNOSIS — E785 Hyperlipidemia, unspecified: Secondary | ICD-10-CM

## 2019-06-12 DIAGNOSIS — M25552 Pain in left hip: Secondary | ICD-10-CM | POA: Diagnosis not present

## 2019-06-19 DIAGNOSIS — M25552 Pain in left hip: Secondary | ICD-10-CM | POA: Diagnosis not present

## 2019-07-02 ENCOUNTER — Other Ambulatory Visit: Payer: Self-pay

## 2019-07-02 DIAGNOSIS — M7062 Trochanteric bursitis, left hip: Secondary | ICD-10-CM | POA: Diagnosis not present

## 2019-07-02 NOTE — Progress Notes (Signed)
Subjective:   Travis Schwartz is a 79 y.o. male who presents for an Initial Medicare Annual Wellness Visit.  Review of Systems  Cardiac Risk Factors include: advanced age (>58men, >20 women);dyslipidemia;hypertension;male gender Home Safety/Smoke Alarms: Feels safe in home. Smoke alarms in place.  Lives w/ wife and dog. 2 story. No trouble w/ stairs.   Male:   CCS- 01/01/13.  No longer doing routine screening due to age. 12 PSA-  Lab Results  Component Value Date   PSA 0.26 12/31/2018   PSA 0.12 12/19/2016   PSA 0.08 (L) 12/16/2015      Objective:    Today's Vitals   07/03/19 0942  BP: 124/68  Pulse: 73  Temp: (!) 97 F (36.1 C)  TempSrc: Temporal  SpO2: 98%  Weight: 217 lb 6.4 oz (98.6 kg)  Height: 5\' 8"  (1.727 m)   Body mass index is 33.06 kg/m.  Advanced Directives 07/03/2019  Does Patient Have a Medical Advance Directive? No  Would patient like information on creating a medical advance directive? No - Patient declined    Current Medications (verified) Outpatient Encounter Medications as of 07/03/2019  Medication Sig  . atorvastatin (LIPITOR) 40 MG tablet TAKE 1 TABLET BY MOUTH EVERY DAY  . diclofenac Sodium (VOLTAREN) 1 % GEL May apply a dab up to 4 times daily.  . hydrochlorothiazide (HYDRODIURIL) 12.5 MG tablet TAKE 1 TABLET(12.5 MG) BY MOUTH DAILY  . levothyroxine (SYNTHROID) 75 MCG tablet Take 1 tablet (75 mcg total) by mouth daily before breakfast.  . omeprazole (PRILOSEC) 20 MG capsule TAKE 1 CAPSULE BY MOUTH DAILY   No facility-administered encounter medications on file as of 07/03/2019.    Allergies (verified) Patient has no known allergies.   History: Past Medical History:  Diagnosis Date  . Allergy    no per pt  . Arthritis   . Cancer [facility])    prostate  . Diverticulosis    2008  . GERD (gastroesophageal reflux disease)   . Hiatal hernia   . Hyperlipidemia   . Prostate cancer ([place]) 1992   Past Surgical History:  Procedure Laterality  Date  . CATARACT EXTRACTION     both eyes  . CHOLECYSTECTOMY    . COLONOSCOPY    . TONSILLECTOMY    . TRANSURETHRAL RESECTION OF PROSTATE    . UPPER GASTROINTESTINAL ENDOSCOPY     Family History  Problem Relation Age of Onset  . Cancer Sister        uterine  . Colon cancer Mother   . Hyperlipidemia Other   . COPD Other        colon  . Esophageal cancer Neg Hx   . Rectal cancer Neg Hx   . Stomach cancer Neg Hx    Social History   Socioeconomic History  . Marital status: Married    Spouse name: Not on file  . Number of children: Not on file  . Years of education: Not on file  . Highest education level: Not on file  Occupational History  . Not on file  Tobacco Use  . Smoking status: Former Smoker    Packs/day: 2.00    Years: 3.00    Pack years: 6.00    Types: Cigarettes    Quit date: 03/22/1963    Years since quitting: 56.3  . Smokeless tobacco: Former Network engineer and Sexual Activity  . Alcohol use: Yes    Alcohol/week: 0.0 standard drinks    Comment: rarely  . Drug use: No  .  Sexual activity: Not on file  Other Topics Concern  . Not on file  Social History Narrative   Lives with wife, son, and granddaughter in a 2 story home.  1 child.     Previously worked as a Administrator for 30 years.     High school education.   Social Determinants of Health   Financial Resource Strain: Low Risk   . Difficulty of Paying Living Expenses: Not hard at all  Food Insecurity: No Food Insecurity  . Worried About Charity fundraiser in the Last Year: Never true  . Ran Out of Food in the Last Year: Never true  Transportation Needs: No Transportation Needs  . Lack of Transportation (Medical): No  . Lack of Transportation (Non-Medical): No  Physical Activity:   . Days of Exercise per Week:   . Minutes of Exercise per Session:   Stress:   . Feeling of Stress :   Social Connections:   . Frequency of Communication with Friends and Family:   . Frequency of Social Gatherings  with Friends and Family:   . Attends Religious Services:   . Active Member of Clubs or Organizations:   . Attends Archivist Meetings:   Marland Kitchen Marital Status:    Tobacco Counseling Counseling given: Not Answered   Clinical Intake: Pain : No/denies pain    Activities of Daily Living In your present state of health, do you have any difficulty performing the following activities: 07/03/2019  Hearing? Y  Vision? N  Difficulty concentrating or making decisions? N  Walking or climbing stairs? N  Dressing or bathing? N  Doing errands, shopping? N  Preparing Food and eating ? N  Using the Toilet? N  In the past six months, have you accidently leaked urine? N  Do you have problems with loss of bowel control? N  Managing your Medications? N  Managing your Finances? N  Housekeeping or managing your Housekeeping? N  Some recent data might be hidden     Immunizations and Health Maintenance Immunization History  Administered Date(s) Administered  . Fluad Quad(high Dose 65+) 12/31/2018  . Influenza Whole 03/21/2004, 12/19/2008  . Influenza, High Dose Seasonal PF 12/16/2015, 12/19/2016, 12/26/2017  . Influenza,inj,Quad PF,6+ Mos 01/14/2014, 12/15/2014  . Influenza,inj,quad, With Preservative 11/19/2016  . Pneumococcal Conjugate-13 12/15/2014  . Pneumococcal Polysaccharide-23 03/21/2005  . Pneumococcal-Unspecified 11/19/2016  . Td 03/21/2005  . Tdap 12/16/2015  . Zoster 01/22/2008   Health Maintenance Due  Topic Date Due  . COLONOSCOPY  01/01/2018    Patient Care Team: Libby Maw, MD as PCP - General (Family Medicine)  Indicate any recent Medical Services you may have received from other than [facility] providers in the past year (date may be approximate).    Assessment:   This is a routine wellness examination for Travis Schwartz. Physical assessment deferred to PCP.  Hearing/Vision screen  Hearing Screening   125Hz  250Hz  500Hz  1000Hz  2000Hz  3000Hz  4000Hz  6000Hz  8000Hz    Right ear:           Left ear:           Comments: Pt declines. States he would not do anything either way at this point.    Visual Acuity Screening   Right eye Left eye Both eyes  Without correction: 20/20 20/20 20/20   With correction:       Dietary issues and exercise activities discussed: Current Exercise Habits: The patient does not participate in regular exercise at present, Exercise limited by: None identified  Diet (meal preparation, eat out, water intake, caffeinated beverages, dairy products, fruits and vegetables): in general, a "healthy" diet  , well balanced      Goals    . Increase physical activity      Depression Screen PHQ 2/9 Scores 07/03/2019 05/02/2019 12/25/2017 12/19/2016  PHQ - 2 Score 0 0 0 0    Fall Risk Fall Risk  07/03/2019 05/02/2019 12/25/2017 12/16/2015 12/15/2014  Falls in the past year? 0 0 No Yes Yes  Number falls in past yr: 0 - - 1 1  Injury with Fall? 0 - - No No  Follow up Education provided;Falls prevention discussed - - - -     Cognitive Function: Ad8 score reviewed for issues:  Issues making decisions:no  Less interest in hobbies / activities:no  Repeats questions, stories (family complaining):no  Trouble using ordinary gadgets (microwave, computer, phone):no  Forgets the month or year: no  Mismanaging finances: no  Remembering appts:no  Daily problems with thinking and/or memory:no Ad8 score is=0          Screening Tests Health Maintenance  Topic Date Due  . COLONOSCOPY  01/01/2018  . INFLUENZA VACCINE  10/20/2019  . TETANUS/TDAP  12/15/2025  . PNA vac Low Risk Adult  Completed       Plan:    Please schedule your next medicare wellness visit with me in 1 yr.  Continue to eat heart healthy diet (full of fruits, vegetables, whole grains, lean protein, water--limit salt, fat, and sugar intake) and increase physical activity as tolerated.  Continue doing brain stimulating activities (puzzles, reading, adult coloring  books, staying active) to keep memory sharp.    I have personally reviewed and noted the following in the patient's chart:   . Medical and social history . Use of alcohol, tobacco or illicit drugs  . Current medications and supplements . Functional ability and status . Nutritional status . Physical activity . Advanced directives . List of other physicians . Hospitalizations, surgeries, and ER visits in previous 12 months . Vitals . Screenings to include cognitive, depression, and falls . Referrals and appointments  In addition, I have reviewed and discussed with patient certain preventive protocols, quality metrics, and best practice recommendations. A written personalized care plan for preventive services as well as general preventive health recommendations were provided to patient.     Naaman Plummer [place], [place]   07/03/2019

## 2019-07-03 ENCOUNTER — Encounter: Payer: Self-pay | Admitting: *Deleted

## 2019-07-03 ENCOUNTER — Ambulatory Visit (INDEPENDENT_AMBULATORY_CARE_PROVIDER_SITE_OTHER): Payer: Medicare Other | Admitting: *Deleted

## 2019-07-03 VITALS — BP 124/68 | HR 73 | Temp 97.0°F | Ht 68.0 in | Wt 217.4 lb

## 2019-07-03 DIAGNOSIS — Z0000 Encounter for general adult medical examination without abnormal findings: Secondary | ICD-10-CM | POA: Diagnosis not present

## 2019-07-03 NOTE — Patient Instructions (Signed)
Please schedule your next medicare wellness visit with me in 1 yr.  Continue to eat heart healthy diet (full of fruits, vegetables, whole grains, lean protein, water--limit salt, fat, and sugar intake) and increase physical activity as tolerated.  Continue doing brain stimulating activities (puzzles, reading, adult coloring books, staying active) to keep memory sharp.    Travis Schwartz , Thank you for taking time to come for your Medicare Wellness Visit. I appreciate your ongoing commitment to your health goals. Please review the following plan we discussed and let me know if I can assist you in the future.   These are the goals we discussed: Goals    . Increase physical activity       This is a list of the screening recommended for you and due dates:  Health Maintenance  Topic Date Due  . Colon Cancer Screening  01/01/2018  . Flu Shot  10/20/2019  . Tetanus Vaccine  12/15/2025  . Pneumonia vaccines  Completed    Preventive Care 10 Years and Older, Male Preventive care refers to lifestyle choices and visits with your health care provider that can promote health and wellness. This includes:  A yearly physical exam. This is also called an annual well check.  Regular dental and eye exams.  Immunizations.  Screening for certain conditions.  Healthy lifestyle choices, such as diet and exercise. What can I expect for my preventive care visit? Physical exam Your health care provider will check:  Height and weight. These may be used to calculate body mass index (BMI), which is a measurement that tells if you are at a healthy weight.  Heart rate and blood pressure.  Your skin for abnormal spots. Counseling Your health care provider may ask you questions about:  Alcohol, tobacco, and drug use.  Emotional well-being.  Home and relationship well-being.  Sexual activity.  Eating habits.  History of falls.  Memory and ability to understand (cognition).  Work and work  Statistician. What immunizations do I need?  Influenza (flu) vaccine  This is recommended every year. Tetanus, diphtheria, and pertussis (Tdap) vaccine  You may need a Td booster every 10 years. Varicella (chickenpox) vaccine  You may need this vaccine if you have not already been vaccinated. Zoster (shingles) vaccine  You may need this after age 49. Pneumococcal conjugate (PCV13) vaccine  One dose is recommended after age 6. Pneumococcal polysaccharide (PPSV23) vaccine  One dose is recommended after age 62. Measles, mumps, and rubella (MMR) vaccine  You may need at least one dose of MMR if you were born in 1957 or later. You may also need a second dose. Meningococcal conjugate (MenACWY) vaccine  You may need this if you have certain conditions. Hepatitis A vaccine  You may need this if you have certain conditions or if you travel or work in places where you may be exposed to hepatitis A. Hepatitis B vaccine  You may need this if you have certain conditions or if you travel or work in places where you may be exposed to hepatitis B. Haemophilus influenzae type b (Hib) vaccine  You may need this if you have certain conditions. You may receive vaccines as individual doses or as more than one vaccine together in one shot (combination vaccines). Talk with your health care provider about the risks and benefits of combination vaccines. What tests do I need? Blood tests  Lipid and cholesterol levels. These may be checked every 5 years, or more frequently depending on your overall health.  Hepatitis C test.  Hepatitis B test. Screening  Lung cancer screening. You may have this screening every year starting at age 35 if you have a 30-pack-year history of smoking and currently smoke or have quit within the past 15 years.  Colorectal cancer screening. All adults should have this screening starting at age 79 and continuing until age 70. Your health care provider may recommend  screening at age 43 if you are at increased risk. You will have tests every 1-10 years, depending on your results and the type of screening test.  Prostate cancer screening. Recommendations will vary depending on your family history and other risks.  Diabetes screening. This is done by checking your blood sugar (glucose) after you have not eaten for a while (fasting). You may have this done every 1-3 years.  Abdominal aortic aneurysm (AAA) screening. You may need this if you are a current or former smoker.  Sexually transmitted disease (STD) testing. Follow these instructions at home: Eating and drinking  Eat a diet that includes fresh fruits and vegetables, whole grains, lean protein, and low-fat dairy products. Limit your intake of foods with high amounts of sugar, saturated fats, and salt.  Take vitamin and mineral supplements as recommended by your health care provider.  Do not drink alcohol if your health care provider tells you not to drink.  If you drink alcohol: ? Limit how much you have to 0-2 drinks a day. ? Be aware of how much alcohol is in your drink. In the [place] one drink equals one 12 oz bottle of beer (355 mL), one 5 oz glass of wine (148 mL), or one 1 oz glass of hard liquor (44 mL). Lifestyle  Take daily care of your teeth and gums.  Stay active. Exercise for at least 30 minutes on 5 or more days each week.  Do not use any products that contain nicotine or tobacco, such as cigarettes, e-cigarettes, and chewing tobacco. If you need help quitting, ask your health care provider.  If you are sexually active, practice safe sex. Use a condom or other form of protection to prevent STIs (sexually transmitted infections).  Talk with your health care provider about taking a low-dose aspirin or statin. What's next?  Visit your health care provider once a year for a well check visit.  Ask your health care provider how often you should have your eyes and teeth  checked.  Stay up to date on all vaccines. This information is not intended to replace advice given to you by your health care provider. Make sure you discuss any questions you have with your health care provider. Document Revised: 03/01/2018 Document Reviewed: 03/01/2018 Elsevier Patient Education  2020 [employer].

## 2019-07-29 ENCOUNTER — Other Ambulatory Visit: Payer: Self-pay

## 2019-07-30 ENCOUNTER — Ambulatory Visit (INDEPENDENT_AMBULATORY_CARE_PROVIDER_SITE_OTHER): Payer: Medicare Other | Admitting: Family Medicine

## 2019-07-30 ENCOUNTER — Encounter: Payer: Self-pay | Admitting: Family Medicine

## 2019-07-30 VITALS — BP 132/76 | HR 84 | Temp 97.5°F | Ht 68.0 in | Wt 217.4 lb

## 2019-07-30 DIAGNOSIS — E039 Hypothyroidism, unspecified: Secondary | ICD-10-CM | POA: Diagnosis not present

## 2019-07-30 DIAGNOSIS — I10 Essential (primary) hypertension: Secondary | ICD-10-CM

## 2019-07-30 DIAGNOSIS — Z8546 Personal history of malignant neoplasm of prostate: Secondary | ICD-10-CM | POA: Diagnosis not present

## 2019-07-30 DIAGNOSIS — E785 Hyperlipidemia, unspecified: Secondary | ICD-10-CM | POA: Diagnosis not present

## 2019-07-30 LAB — BASIC METABOLIC PANEL
BUN: 19 mg/dL (ref 6–23)
CO2: 28 mEq/L (ref 19–32)
Calcium: 9 mg/dL (ref 8.4–10.5)
Chloride: 102 mEq/L (ref 96–112)
Creatinine, Ser: 0.87 mg/dL (ref 0.40–1.50)
GFR: 84.53 mL/min (ref >60.00)
Glucose, Bld: 132 mg/dL — ABNORMAL HIGH (ref 70–99)
Potassium: 4.3 mEq/L (ref 3.5–5.1)
Sodium: 141 mEq/L (ref 135–145)

## 2019-07-30 LAB — CBC
HCT: 41.9 % (ref 39.0–52.0)
Hemoglobin: 14.5 g/dL (ref 13.0–17.0)
MCHC: 34.5 g/dL (ref 30.0–36.0)
MCV: 96 fl (ref 78.0–100.0)
Platelets: 346 10*3/uL (ref 150.0–400.0)
RBC: 4.37 Mil/uL (ref 4.22–5.81)
RDW: 16.3 % — ABNORMAL HIGH (ref 11.5–15.5)
WBC: 5.3 10*3/uL (ref 4.0–10.5)

## 2019-07-30 LAB — LDL CHOLESTEROL, DIRECT: Direct LDL: 72 mg/dL

## 2019-07-30 LAB — PSA: PSA: 0.26 ng/mL (ref 0.10–4.00)

## 2019-07-30 LAB — TSH: TSH: 2.02 u[IU]/mL (ref 0.35–4.50)

## 2019-07-30 NOTE — Progress Notes (Signed)
Established Patient Office Visit  Subjective:  Patient ID: Travis Schwartz, male    DOB: August 31, 1940  Age: 79 y.o. MRN: [id number]  CC:  Chief Complaint  Patient presents with  . Follow-up    3 month follow up on Hyperthyroidism, no concerns.     HPI Travis Schwartz presents for follow-up of his hypothyroidism.  Continues taking levothyroxine daily on an empty stomach before breakfast.  Did see orthopedics about his hip pain and was diagnosed with bursitis and given physical therapy to do.  Hip is better.  Denies chronic ongoing lower back pain.  Lower back bothers him just from time to time.  He takes aspirin.  Status post prostatectomy 1992.  Past Medical History:  Diagnosis Date  . Allergy    no per pt  . Arthritis   . Cancer [facility])    prostate  . Diverticulosis    2008  . GERD (gastroesophageal reflux disease)   . Hiatal hernia   . Hyperlipidemia   . Prostate cancer ([place]) 1992    Past Surgical History:  Procedure Laterality Date  . CATARACT EXTRACTION     both eyes  . CHOLECYSTECTOMY    . COLONOSCOPY    . TONSILLECTOMY    . TRANSURETHRAL RESECTION OF PROSTATE    . UPPER GASTROINTESTINAL ENDOSCOPY      Family History  Problem Relation Age of Onset  . Cancer Sister        uterine  . Colon cancer Mother   . Hyperlipidemia Other   . COPD Other        colon  . Esophageal cancer Neg Hx   . Rectal cancer Neg Hx   . Stomach cancer Neg Hx     Social History   Socioeconomic History  . Marital status: Married    Spouse name: Not on file  . Number of children: Not on file  . Years of education: Not on file  . Highest education level: Not on file  Occupational History  . Not on file  Tobacco Use  . Smoking status: Former Smoker    Packs/day: 2.00    Years: 3.00    Pack years: 6.00    Types: Cigarettes    Quit date: 03/22/1963    Years since quitting: 56.3  . Smokeless tobacco: Former Network engineer and Sexual Activity  . Alcohol use: Yes   Alcohol/week: 0.0 standard drinks    Comment: rarely  . Drug use: No  . Sexual activity: Not on file  Other Topics Concern  . Not on file  Social History Narrative   Lives with wife, son, and granddaughter in a 2 story home.  1 child.     Previously worked as a Administrator for 30 years.     High school education.   Social Determinants of Health   Financial Resource Strain: Low Risk   . Difficulty of Paying Living Expenses: Not hard at all  Food Insecurity: No Food Insecurity  . Worried About Charity fundraiser in the Last Year: Never true  . Ran Out of Food in the Last Year: Never true  Transportation Needs: No Transportation Needs  . Lack of Transportation (Medical): No  . Lack of Transportation (Non-Medical): No  Physical Activity:   . Days of Exercise per Week:   . Minutes of Exercise per Session:   Stress:   . Feeling of Stress :   Social Connections:   . Frequency of Communication with Friends and  Family:   . Frequency of Social Gatherings with Friends and Family:   . Attends Religious Services:   . Active Member of Clubs or Organizations:   . Attends Archivist Meetings:   Marland Kitchen Marital Status:   Intimate Partner Violence:   . Fear of Current or Ex-Partner:   . Emotionally Abused:   Marland Kitchen Physically Abused:   . Sexually Abused:     Outpatient Medications Prior to Visit  Medication Sig Dispense Refill  . atorvastatin (LIPITOR) 40 MG tablet TAKE 1 TABLET BY MOUTH EVERY DAY 90 tablet 0  . hydrochlorothiazide (HYDRODIURIL) 12.5 MG tablet TAKE 1 TABLET(12.5 MG) BY MOUTH DAILY 90 tablet 1  . levothyroxine (SYNTHROID) 75 MCG tablet Take 1 tablet (75 mcg total) by mouth daily before breakfast. 90 tablet 0  . omeprazole (PRILOSEC) 20 MG capsule TAKE 1 CAPSULE BY MOUTH DAILY 100 capsule 0  . diclofenac Sodium (VOLTAREN) 1 % GEL May apply a dab up to 4 times daily. (Patient not taking: Reported on 07/30/2019) 150 g 0   No facility-administered medications prior to visit.     No Known Allergies  ROS Review of Systems  Constitutional: Negative.   HENT: Negative.   Eyes: Negative for photophobia and visual disturbance.  Respiratory: Negative.   Cardiovascular: Negative.   Gastrointestinal: Negative.   Endocrine: Negative for polyphagia and polyuria.  Genitourinary: Negative for difficulty urinating, frequency and hematuria.  Musculoskeletal: Positive for arthralgias. Negative for gait problem.  Neurological: Negative for speech difficulty and weakness.  Hematological: Does not bruise/bleed easily.  Psychiatric/Behavioral: Negative.       Objective:    Physical Exam  Constitutional: He is oriented to person, place, and time. He appears well-developed and well-nourished. No distress.  HENT:  Head: Normocephalic and atraumatic.  Right Ear: External ear normal.  Left Ear: External ear normal.  Eyes: Conjunctivae are normal. Right eye exhibits no discharge. Left eye exhibits no discharge. No scleral icterus.  Neck: No JVD present. No tracheal deviation present.  Cardiovascular: Normal rate, regular rhythm and normal heart sounds.  Pulmonary/Chest: Effort normal and breath sounds normal. No stridor.  Musculoskeletal:        General: No edema.  Neurological: He is alert and oriented to person, place, and time.  Skin: Skin is warm and dry. He is not diaphoretic.  Psychiatric: He has a normal mood and affect. His behavior is normal.    BP 132/76   Pulse 84   Temp (!) 97.5 F (36.4 C) (Tympanic)   Ht 5\' 8"  (1.727 m)   Wt 217 lb 6.4 oz (98.6 kg)   SpO2 96%   BMI 33.06 kg/m  Wt Readings from Last 3 Encounters:  07/30/19 217 lb 6.4 oz (98.6 kg)  07/03/19 217 lb 6.4 oz (98.6 kg)  05/02/19 220 lb 3.2 oz (99.9 kg)     Health Maintenance Due  Topic Date Due  . COVID-19 Vaccine (1) Never done  . COLONOSCOPY  01/01/2018    There are no preventive care reminders to display for this patient.  Lab Results  Component Value Date   TSH 4.93 (H)  05/02/2019   Lab Results  Component Value Date   WBC 5.7 12/31/2018   HGB 15.1 12/31/2018   HCT 43.6 12/31/2018   MCV 96.3 12/31/2018   PLT 395.0 12/31/2018   Lab Results  Component Value Date   NA 142 12/31/2018   K 4.1 12/31/2018   CO2 28 12/31/2018   GLUCOSE 93 12/31/2018  BUN 17 12/31/2018   CREATININE 1.05 12/31/2018   BILITOT 1.3 (H) 12/31/2018   ALKPHOS 75 12/31/2018   AST 21 12/31/2018   ALT 29 12/31/2018   PROT 6.4 12/31/2018   ALBUMIN 4.6 12/31/2018   CALCIUM 9.4 12/31/2018   GFR 68.14 12/31/2018   Lab Results  Component Value Date   CHOL 137 12/31/2018   Lab Results  Component Value Date   HDL 40.40 12/31/2018   Lab Results  Component Value Date   LDLCALC 70 12/31/2018   Lab Results  Component Value Date   TRIG 134.0 12/31/2018   Lab Results  Component Value Date   CHOLHDL 3 12/31/2018   No results found for: HGBA1C    Assessment & Plan:   Problem List Items Addressed This Visit      Cardiovascular and Mediastinum   Essential hypertension   Relevant Orders   CBC   Basic metabolic panel     Endocrine   Acquired hypothyroidism - Primary   Relevant Orders   TSH     Other   Hyperlipidemia   Relevant Orders   LDL cholesterol, direct   History of prostate cancer   Relevant Orders   PSA      No orders of the defined types were placed in this encounter.   Follow-up: Return in about 3 months (around 10/30/2019).    Libby Maw, MD

## 2019-08-11 ENCOUNTER — Other Ambulatory Visit: Payer: Self-pay | Admitting: Family Medicine

## 2019-08-11 DIAGNOSIS — E039 Hypothyroidism, unspecified: Secondary | ICD-10-CM

## 2019-09-15 ENCOUNTER — Other Ambulatory Visit: Payer: Self-pay | Admitting: Family Medicine

## 2019-09-15 DIAGNOSIS — I10 Essential (primary) hypertension: Secondary | ICD-10-CM

## 2019-09-15 DIAGNOSIS — E785 Hyperlipidemia, unspecified: Secondary | ICD-10-CM

## 2019-09-15 DIAGNOSIS — Z0000 Encounter for general adult medical examination without abnormal findings: Secondary | ICD-10-CM

## 2019-09-16 DIAGNOSIS — M62830 Muscle spasm of back: Secondary | ICD-10-CM | POA: Diagnosis not present

## 2019-09-16 DIAGNOSIS — M9903 Segmental and somatic dysfunction of lumbar region: Secondary | ICD-10-CM | POA: Diagnosis not present

## 2019-09-16 DIAGNOSIS — M5417 Radiculopathy, lumbosacral region: Secondary | ICD-10-CM | POA: Diagnosis not present

## 2019-09-16 DIAGNOSIS — M546 Pain in thoracic spine: Secondary | ICD-10-CM | POA: Diagnosis not present

## 2019-09-16 DIAGNOSIS — M9902 Segmental and somatic dysfunction of thoracic region: Secondary | ICD-10-CM | POA: Diagnosis not present

## 2019-10-04 DIAGNOSIS — M62830 Muscle spasm of back: Secondary | ICD-10-CM | POA: Diagnosis not present

## 2019-10-04 DIAGNOSIS — M9902 Segmental and somatic dysfunction of thoracic region: Secondary | ICD-10-CM | POA: Diagnosis not present

## 2019-10-04 DIAGNOSIS — M9903 Segmental and somatic dysfunction of lumbar region: Secondary | ICD-10-CM | POA: Diagnosis not present

## 2019-10-04 DIAGNOSIS — M546 Pain in thoracic spine: Secondary | ICD-10-CM | POA: Diagnosis not present

## 2019-10-04 DIAGNOSIS — M5417 Radiculopathy, lumbosacral region: Secondary | ICD-10-CM | POA: Diagnosis not present

## 2019-10-30 ENCOUNTER — Other Ambulatory Visit: Payer: Self-pay

## 2019-10-30 ENCOUNTER — Ambulatory Visit (INDEPENDENT_AMBULATORY_CARE_PROVIDER_SITE_OTHER): Payer: Medicare Other | Admitting: Family Medicine

## 2019-10-30 ENCOUNTER — Encounter: Payer: Self-pay | Admitting: Family Medicine

## 2019-10-30 VITALS — BP 130/82 | HR 80 | Resp 16 | Ht 68.0 in | Wt 213.5 lb

## 2019-10-30 DIAGNOSIS — I10 Essential (primary) hypertension: Secondary | ICD-10-CM

## 2019-10-30 DIAGNOSIS — M7062 Trochanteric bursitis, left hip: Secondary | ICD-10-CM

## 2019-10-30 DIAGNOSIS — R7309 Other abnormal glucose: Secondary | ICD-10-CM | POA: Diagnosis not present

## 2019-10-30 LAB — BASIC METABOLIC PANEL
BUN: 23 mg/dL (ref 6–23)
CO2: 27 mEq/L (ref 19–32)
Calcium: 9.1 mg/dL (ref 8.4–10.5)
Chloride: 104 mEq/L (ref 96–112)
Creatinine, Ser: 0.99 mg/dL (ref 0.40–1.50)
GFR: 72.77 mL/min (ref >60.00)
Glucose, Bld: 96 mg/dL (ref 70–99)
Potassium: 4.7 mEq/L (ref 3.5–5.1)
Sodium: 142 mEq/L (ref 135–145)

## 2019-10-30 LAB — HEMOGLOBIN A1C: Hgb A1c MFr Bld: 5.4 % (ref 4.6–6.5)

## 2019-10-30 NOTE — Patient Instructions (Signed)
Hip Bursitis Rehab Ask your health care provider which exercises are safe for you. Do exercises exactly as told by your health care provider and adjust them as directed. It is normal to feel mild stretching, pulling, tightness, or discomfort as you do these exercises. Stop right away if you feel sudden pain or your pain gets worse. Do not begin these exercises until told by your health care provider. Stretching exercise This exercise warms up your muscles and joints and improves the movement and flexibility of your hip. This exercise also helps to relieve pain and stiffness. Iliotibial band stretch An iliotibial band is a strong band of muscle tissue that runs from the outer side of your hip to the outer side of your thigh and knee. 1. Lie on your side with your left / right leg in the top position. 2. Bend your left / right knee and grab your ankle. Stretch out your bottom arm to help you balance. 3. Slowly bring your knee back so your thigh is behind your body. 4. Slowly lower your knee toward the floor until you feel a gentle stretch on the outside of your left / right thigh. If you do not feel a stretch and your knee will not fall farther, place the heel of your other foot on top of your knee and pull your knee down toward the floor with your foot. 5. Hold this position for __________ seconds. 6. Slowly return to the starting position. Repeat __________ times. Complete this exercise __________ times a day. Strengthening exercises These exercises build strength and endurance in your hip and pelvis. Endurance is the ability to use your muscles for a long time, even after they get tired. Bridge This exercise strengthens the muscles that move your thigh backward (hip extensors). 1. Lie on your back on a firm surface with your knees bent and your feet flat on the floor. 2. Tighten your buttocks muscles and lift your buttocks off the floor until your trunk is level with your thighs. ? Do not arch  your back. ? You should feel the muscles working in your buttocks and the back of your thighs. If you do not feel these muscles, slide your feet 1-2 inches (2.5-5 cm) farther away from your buttocks. ? If this exercise is too easy, try doing it with your arms crossed over your chest. 3. Hold this position for __________ seconds. 4. Slowly lower your hips to the starting position. 5. Let your muscles relax completely after each repetition. Repeat __________ times. Complete this exercise __________ times a day. Squats This exercise strengthens the muscles in front of your thigh and knee (quadriceps). 1. Stand in front of a table, with your feet and knees pointing straight ahead. You may rest your hands on the table for balance but not for support. 2. Slowly bend your knees and lower your hips like you are going to sit in a chair. ? Keep your weight over your heels, not over your toes. ? Keep your lower legs upright so they are parallel with the table legs. ? Do not let your hips go lower than your knees. ? Do not bend lower than told by your health care provider. ? If your hip pain increases, do not bend as low. 3. Hold the squat position for __________ seconds. 4. Slowly push with your legs to return to standing. Do not use your hands to pull yourself to standing. Repeat __________ times. Complete this exercise __________ times a day. Hip hike 1. Stand   sideways on a bottom step. Stand on your left / right leg with your other foot unsupported next to the step. You can hold on to the railing or wall for balance if needed. 2. Keep your knees straight and your torso square. Then lift your left / right hip up toward the ceiling. 3. Hold this position for __________ seconds. 4. Slowly let your left / right hip lower toward the floor, past the starting position. Your foot should get closer to the floor. Do not lean or bend your knees. Repeat __________ times. Complete this exercise __________ times a  day. Single leg stand 1. Without shoes, stand near a railing or in a doorway. You may hold on to the railing or door frame as needed for balance. 2. Squeeze your left / right buttock muscles, then lift up your other foot. ? Do not let your left / right hip push out to the side. ? It is helpful to stand in front of a mirror for this exercise so you can watch your hip. 3. Hold this position for __________ seconds. Repeat __________ times. Complete this exercise __________ times a day. This information is not intended to replace advice given to you by your health care provider. Make sure you discuss any questions you have with your health care provider. Document Revised: 07/02/2018 Document Reviewed: 07/02/2018 Elsevier Patient Education  [place].  Hip Bursitis  Hip bursitis is inflammation of a fluid-filled sac (bursa) in the hip joint. The bursa prevents the bones in the hip joint from rubbing against each other. Hip bursitis can cause mild to moderate pain, and symptoms often come and go over time. What are the causes? This condition may be caused by:  Injury to the hip.  Overuse of the muscles that surround the hip joint.  Previous injury or surgery of the hip.  Arthritis or gout.  Diabetes.  Thyroid disease.  Infection. In some cases, the cause may not be known. What are the signs or symptoms? Symptoms of this condition include:  Mild or moderate pain in the hip area. Pain may get worse with movement.  Tenderness and swelling of the hip, especially on the outer side of the hip.  In rare cases, the bursa may become infected. This may cause a fever, as well as warmth and redness in the area. Symptoms may come and go. How is this diagnosed? This condition may be diagnosed based on:  A physical exam.  Your medical history.  X-rays.  Removal of fluid from your inflamed bursa for testing (biopsy). You may be sent to a health care provider who specializes in  bone diseases (orthopedist) or a provider who specializes in joint inflammation (rheumatologist). How is this treated? This condition is treated by resting, icing, applying pressure (compression), and raising (elevating) the injured area. This is called RICE treatment. In some cases, this may be enough to make your symptoms go away. Treatment may also include:  Using crutches.  Draining fluid out of the bursa to help relieve swelling.  Injecting medicine that helps to reduce inflammation (cortisone).  Additional medicines if the bursa is infected. Follow these instructions at home: Managing pain, stiffness, and swelling   If directed, put ice on the painful area. ? Put ice in a plastic bag. ? Place a towel between your skin and the bag. ? Leave the ice on for 20 minutes, 2-3 times a day. ? Raise (elevate) your hip above the level of your heart as much as  you can without pain. To do this, try putting a pillow under your hips while you lie down. Activity  Return to your normal activities as told by your health care provider. Ask your health care provider what activities are safe for you.  Rest and protect your hip as much as possible until your pain and swelling get better. General instructions  Take over-the-counter and prescription medicines only as told by your health care provider.  Wear compression wraps only as told by your health care provider.  Do not use your hip to support your body weight until your health care provider says that you can. Use crutches as told by your health care provider.  Gently massage and stretch your injured area as often as is comfortable.  Keep all follow-up visits as told by your health care provider. This is important. How is this prevented?  Exercise regularly, as told by your health care provider.  Warm up and stretch before being active.  Cool down and stretch after being active.  If an activity irritates your hip or causes pain, avoid  the activity as much as possible.  Avoid sitting down for long periods at a time. Contact a health care provider if you:  Have a fever.  Develop new symptoms.  Have difficulty walking or doing everyday activities.  Have pain that gets worse or does not get better with medicine.  Develop red skin or a feeling of warmth in your hip area. Get help right away if you:  Cannot move your hip.  Have severe pain. Summary  Hip bursitis is inflammation of a fluid-filled sac (bursa) in the hip joint.  Hip bursitis can cause mild to moderate pain, and symptoms often come and go over time.  This condition is treated with rest, ice, compression, elevation, and medicines. This information is not intended to replace advice given to you by your health care provider. Make sure you discuss any questions you have with your health care provider. Document Revised: 11/13/2017 Document Reviewed: 11/13/2017 Elsevier Patient Education  [place].

## 2019-10-30 NOTE — Progress Notes (Signed)
Established Patient Office Visit  Subjective:  Patient ID: Travis Schwartz, male    DOB: 1940/12/07  Age: 79 y.o. MRN: [id number]  CC:  Chief Complaint  Patient presents with   Follow-up    HPI Travis Schwartz presents for follow-up for hypertension, hip pain and elevated glucose.  No history of diabetes.  Has seen orthopedics for his hip pain and diagnosed with hip bursitis.  Denies back pain.  Denies weakness in his lower extremities or saddle paresthesias.  Has numbness and tingling in both legs from time to time.  Does not seem to bother him much.  Pain in his hip is in the lateral proximal area.  No specific injury.  Past Medical History:  Diagnosis Date   Allergy    no per pt   Arthritis    Cancer [facility])    prostate   Diverticulosis    2008   GERD (gastroesophageal reflux disease)    Hiatal hernia    Hyperlipidemia    Prostate cancer ([place]) 1992    Past Surgical History:  Procedure Laterality Date   CATARACT EXTRACTION     both eyes   CHOLECYSTECTOMY     COLONOSCOPY     TONSILLECTOMY     TRANSURETHRAL RESECTION OF PROSTATE     UPPER GASTROINTESTINAL ENDOSCOPY      Family History  Problem Relation Age of Onset   Cancer Sister        uterine   Colon cancer Mother    Hyperlipidemia Other    COPD Other        colon   Esophageal cancer Neg Hx    Rectal cancer Neg Hx    Stomach cancer Neg Hx     Social History   Socioeconomic History   Marital status: Married    Spouse name: Not on file   Number of children: Not on file   Years of education: Not on file   Highest education level: Not on file  Occupational History   Not on file  Tobacco Use   Smoking status: Former Smoker    Packs/day: 2.00    Years: 3.00    Pack years: 6.00    Types: Cigarettes    Quit date: 03/22/1963    Years since quitting: 56.6   Smokeless tobacco: Former Systems developer  Substance and Sexual Activity   Alcohol use: Yes    Alcohol/week: 0.0 standard drinks      Comment: rarely   Drug use: No   Sexual activity: Not on file  Other Topics Concern   Not on file  Social History Narrative   Lives with wife, son, and granddaughter in a 2 story home.  1 child.     Previously worked as a Administrator for 30 years.     High school education.   Social Determinants of Health   Financial Resource Strain: Low Risk    Difficulty of Paying Living Expenses: Not hard at all  Food Insecurity: No Food Insecurity   Worried About Charity fundraiser in the Last Year: Never true   [employer] in the Last Year: Never true  Transportation Needs: No Transportation Needs   Lack of Transportation (Medical): No   Lack of Transportation (Non-Medical): No  Physical Activity:    Days of Exercise per Week:    Minutes of Exercise per Session:   Stress:    Feeling of Stress :   Social Connections:    Frequency of Communication with  Friends and Family:    Frequency of Social Gatherings with Friends and Family:    Attends Religious Services:    Active Member of Clubs or Organizations:    Attends Music therapist:    Marital Status:   Intimate Partner Violence:    Fear of Current or Ex-Partner:    Emotionally Abused:    Physically Abused:    Sexually Abused:     Outpatient Medications Prior to Visit  Medication Sig Dispense Refill   atorvastatin (LIPITOR) 40 MG tablet TAKE 1 TABLET BY MOUTH EVERY DAY 90 tablet 0   hydrochlorothiazide (HYDRODIURIL) 12.5 MG tablet TAKE 1 TABLET(12.5 MG) BY MOUTH DAILY 90 tablet 1   levothyroxine (SYNTHROID) 75 MCG tablet TAKE 1 TABLET(75 MCG) BY MOUTH DAILY BEFORE BREAKFAST 90 tablet 0   omeprazole (PRILOSEC) 20 MG capsule TAKE 1 CAPSULE BY MOUTH DAILY 100 capsule 0   diclofenac Sodium (VOLTAREN) 1 % GEL May apply a dab up to 4 times daily. (Patient not taking: Reported on 07/30/2019) 150 g 0   meloxicam (MOBIC) 15 MG tablet Take 15 mg by mouth daily. (Patient not taking: Reported on  10/30/2019)     omeprazole (PRILOSEC) 20 MG capsule TAKE 1 CAPSULE BY MOUTH DAILY 100 capsule 0   No facility-administered medications prior to visit.    No Known Allergies  ROS Review of Systems  Constitutional: Negative.   Respiratory: Negative.   Cardiovascular: Negative.   Gastrointestinal: Negative.   Endocrine: Negative for polyphagia and polyuria.  Musculoskeletal: Positive for arthralgias. Negative for gait problem.  Neurological: Negative for weakness and numbness.  Psychiatric/Behavioral: Negative.       Objective:    Physical Exam Vitals and nursing note reviewed.  Constitutional:      General: He is not in acute distress.    Appearance: Normal appearance. He is not ill-appearing, toxic-appearing or diaphoretic.  Pulmonary:     Effort: Pulmonary effort is normal.  Musculoskeletal:     Lumbar back: Normal.     Right hip: No deformity or tenderness. Normal strength.     Left hip: Tenderness present. No deformity. Normal range of motion.       Legs:  Skin:    General: Skin is warm and dry.  Neurological:     Mental Status: He is alert and oriented to person, place, and time.  Psychiatric:        Mood and Affect: Mood normal.        Behavior: Behavior normal.     BP 130/82    Pulse 80    Resp 16    Ht 5\' 8"  (1.727 m)    Wt 213 lb 8 oz (96.8 kg)    SpO2 98%    BMI 32.46 kg/m  Wt Readings from Last 3 Encounters:  10/30/19 213 lb 8 oz (96.8 kg)  07/30/19 217 lb 6.4 oz (98.6 kg)  07/03/19 217 lb 6.4 oz (98.6 kg)     Health Maintenance Due  Topic Date Due   COLONOSCOPY  01/01/2018   INFLUENZA VACCINE  10/20/2019    There are no preventive care reminders to display for this patient.  Lab Results  Component Value Date   TSH 2.02 07/30/2019   Lab Results  Component Value Date   WBC 5.3 07/30/2019   HGB 14.5 07/30/2019   HCT 41.9 07/30/2019   MCV 96.0 07/30/2019   PLT 346.0 07/30/2019   Lab Results  Component Value Date   NA 141 07/30/2019  K 4.3 07/30/2019   CO2 28 07/30/2019   GLUCOSE 132 (H) 07/30/2019   BUN 19 07/30/2019   CREATININE 0.87 07/30/2019   BILITOT 1.3 (H) 12/31/2018   ALKPHOS 75 12/31/2018   AST 21 12/31/2018   ALT 29 12/31/2018   PROT 6.4 12/31/2018   ALBUMIN 4.6 12/31/2018   CALCIUM 9.0 07/30/2019   GFR 84.53 07/30/2019   Lab Results  Component Value Date   CHOL 137 12/31/2018   Lab Results  Component Value Date   HDL 40.40 12/31/2018   Lab Results  Component Value Date   LDLCALC 70 12/31/2018   Lab Results  Component Value Date   TRIG 134.0 12/31/2018   Lab Results  Component Value Date   CHOLHDL 3 12/31/2018   No results found for: HGBA1C    Assessment & Plan:   Problem List Items Addressed This Visit      Musculoskeletal and Integument   Trochanteric bursitis of left hip     Other   Elevated glucose - Primary   Relevant Orders   Hemoglobin X9K   Basic metabolic panel      No orders of the defined types were placed in this encounter.   Follow-up: Return in about 3 months (around 01/30/2020).   We will go ahead and continue exercises and follow-up with orthopedics for an injection.  See me back in 3 months.  Glucose slightly elevated with past blood work.  Rechecking that along with a hemoglobin A1c.  Given information on hip bursitis and exercises. Libby Maw, MD

## 2019-11-01 DIAGNOSIS — M7062 Trochanteric bursitis, left hip: Secondary | ICD-10-CM | POA: Diagnosis not present

## 2019-11-09 ENCOUNTER — Other Ambulatory Visit: Payer: Self-pay | Admitting: Family Medicine

## 2019-11-09 DIAGNOSIS — E039 Hypothyroidism, unspecified: Secondary | ICD-10-CM

## 2019-12-14 ENCOUNTER — Other Ambulatory Visit: Payer: Self-pay | Admitting: Family Medicine

## 2019-12-14 DIAGNOSIS — E785 Hyperlipidemia, unspecified: Secondary | ICD-10-CM

## 2019-12-14 DIAGNOSIS — Z0000 Encounter for general adult medical examination without abnormal findings: Secondary | ICD-10-CM

## 2020-01-16 ENCOUNTER — Encounter: Payer: Self-pay | Admitting: Family Medicine

## 2020-01-16 ENCOUNTER — Ambulatory Visit (INDEPENDENT_AMBULATORY_CARE_PROVIDER_SITE_OTHER): Payer: Medicare Other | Admitting: Family Medicine

## 2020-01-16 ENCOUNTER — Ambulatory Visit (INDEPENDENT_AMBULATORY_CARE_PROVIDER_SITE_OTHER): Payer: Medicare Other

## 2020-01-16 ENCOUNTER — Other Ambulatory Visit: Payer: Self-pay

## 2020-01-16 VITALS — BP 130/70 | HR 71 | Temp 97.5°F | Ht 68.0 in | Wt 214.4 lb

## 2020-01-16 DIAGNOSIS — Z23 Encounter for immunization: Secondary | ICD-10-CM | POA: Diagnosis not present

## 2020-01-16 DIAGNOSIS — R0789 Other chest pain: Secondary | ICD-10-CM

## 2020-01-16 DIAGNOSIS — S2231XA Fracture of one rib, right side, initial encounter for closed fracture: Secondary | ICD-10-CM | POA: Diagnosis not present

## 2020-01-16 DIAGNOSIS — W19XXXA Unspecified fall, initial encounter: Secondary | ICD-10-CM

## 2020-01-16 DIAGNOSIS — E785 Hyperlipidemia, unspecified: Secondary | ICD-10-CM | POA: Diagnosis not present

## 2020-01-16 LAB — LIPID PANEL
Cholesterol: 143 mg/dL (ref 0–200)
HDL: 38.6 mg/dL — ABNORMAL LOW (ref >39.00)
LDL Cholesterol: 75 mg/dL (ref 0–99)
NonHDL: 104.53
Total CHOL/HDL Ratio: 4
Triglycerides: 146 mg/dL (ref 0.0–149.0)
VLDL: 29.2 mg/dL (ref 0.0–40.0)

## 2020-01-16 NOTE — Progress Notes (Signed)
Established Patient Office Visit  Subjective:  Patient ID: Travis Schwartz, male    DOB: 1941-02-25  Age: 79 y.o. MRN: [id number]  CC:  Chief Complaint  Patient presents with  . Follow-up    3 month follow up, no concerns patient states that he fell a couple of days ago still a little sore.     HPI Travis Schwartz presents for follow-up of his elevated cholesterol.  He is fasting this morning.  We were only able to obtain an LDL last visit.  Hypothyroid is stable.  PSA has been stable over this past year.  He tripped over an unusually tall curb and landed in a grassy area.  Believes that he fell over his right anterior chest.  There is some ongoing discomfort in his chest area underneath his breast when he moves a certain way.  Has had difficulty breathing.  Denies any lightheadedness or dizziness prior to the fall.  He is taking Aleve for the pain.  Past Medical History:  Diagnosis Date  . Allergy    no per pt  . Arthritis   . Cancer [facility])    prostate  . Diverticulosis    2008  . GERD (gastroesophageal reflux disease)   . Hiatal hernia   . Hyperlipidemia   . Prostate cancer ([place]) 1992    Past Surgical History:  Procedure Laterality Date  . CATARACT EXTRACTION     both eyes  . CHOLECYSTECTOMY    . COLONOSCOPY    . TONSILLECTOMY    . TRANSURETHRAL RESECTION OF PROSTATE    . UPPER GASTROINTESTINAL ENDOSCOPY      Family History  Problem Relation Age of Onset  . Cancer Sister        uterine  . Colon cancer Mother   . Hyperlipidemia Other   . COPD Other        colon  . Esophageal cancer Neg Hx   . Rectal cancer Neg Hx   . Stomach cancer Neg Hx     Social History   Socioeconomic History  . Marital status: Married    Spouse name: Not on file  . Number of children: Not on file  . Years of education: Not on file  . Highest education level: Not on file  Occupational History  . Not on file  Tobacco Use  . Smoking status: Former Smoker    Packs/day: 2.00     Years: 3.00    Pack years: 6.00    Types: Cigarettes    Quit date: 03/22/1963    Years since quitting: 56.8  . Smokeless tobacco: Former Network engineer and Sexual Activity  . Alcohol use: Yes    Alcohol/week: 0.0 standard drinks    Comment: rarely  . Drug use: No  . Sexual activity: Not on file  Other Topics Concern  . Not on file  Social History Narrative   Lives with wife, son, and granddaughter in a 2 story home.  1 child.     Previously worked as a Administrator for 30 years.     High school education.   Social Determinants of Health   Financial Resource Strain: Low Risk   . Difficulty of Paying Living Expenses: Not hard at all  Food Insecurity: No Food Insecurity  . Worried About Charity fundraiser in the Last Year: Never true  . Ran Out of Food in the Last Year: Never true  Transportation Needs: No Transportation Needs  . Lack of Transportation (  Medical): No  . Lack of Transportation (Non-Medical): No  Physical Activity:   . Days of Exercise per Week: Not on file  . Minutes of Exercise per Session: Not on file  Stress:   . Feeling of Stress : Not on file  Social Connections:   . Frequency of Communication with Friends and Family: Not on file  . Frequency of Social Gatherings with Friends and Family: Not on file  . Attends Religious Services: Not on file  . Active Member of Clubs or Organizations: Not on file  . Attends Archivist Meetings: Not on file  . Marital Status: Not on file  Intimate Partner Violence:   . Fear of Current or Ex-Partner: Not on file  . Emotionally Abused: Not on file  . Physically Abused: Not on file  . Sexually Abused: Not on file    Outpatient Medications Prior to Visit  Medication Sig Dispense Refill  . atorvastatin (LIPITOR) 40 MG tablet TAKE 1 TABLET BY MOUTH EVERY DAY 90 tablet 0  . hydrochlorothiazide (HYDRODIURIL) 12.5 MG tablet TAKE 1 TABLET(12.5 MG) BY MOUTH DAILY 90 tablet 1  . levothyroxine (SYNTHROID) 75 MCG tablet  TAKE 1 TABLET(75 MCG) BY MOUTH DAILY BEFORE BREAKFAST 90 tablet 0  . omeprazole (PRILOSEC) 20 MG capsule TAKE 1 CAPSULE BY MOUTH DAILY 90 capsule 0  . diclofenac Sodium (VOLTAREN) 1 % GEL May apply a dab up to 4 times daily. (Patient not taking: Reported on 07/30/2019) 150 g 0  . meloxicam (MOBIC) 15 MG tablet Take 15 mg by mouth daily. (Patient not taking: Reported on 10/30/2019)     No facility-administered medications prior to visit.    No Known Allergies  ROS Review of Systems  Constitutional: Negative.   Respiratory: Negative.   Cardiovascular: Positive for chest pain. Negative for palpitations and leg swelling.  Gastrointestinal: Negative.   Genitourinary: Negative.   Musculoskeletal: Negative.   Neurological: Negative.   Hematological: Does not bruise/bleed easily.  Psychiatric/Behavioral: Negative.       Objective:    Physical Exam Vitals and nursing note reviewed.  Constitutional:      General: He is not in acute distress.    Appearance: Normal appearance. He is not ill-appearing, toxic-appearing or diaphoretic.  HENT:     Right Ear: External ear normal.     Left Ear: External ear normal.  Eyes:     General: No scleral icterus.       Right eye: No discharge.        Left eye: No discharge.     Conjunctiva/sclera: Conjunctivae normal.  Cardiovascular:     Rate and Rhythm: Normal rate and regular rhythm.  Pulmonary:     Effort: Pulmonary effort is normal.     Breath sounds: Normal breath sounds.  Chest:       Comments: Mild tenderness to palpation anterior chest just below the left breast Musculoskeletal:     Cervical back: No rigidity or tenderness.     Right lower leg: No edema.     Left lower leg: No edema.  Lymphadenopathy:     Cervical: No cervical adenopathy.  Skin:    General: Skin is warm and dry.  Neurological:     Mental Status: He is alert and oriented to person, place, and time.  Psychiatric:        Mood and Affect: Mood normal.         Behavior: Behavior normal.     BP 130/70   Pulse 71  Temp (!) 97.5 F (36.4 C) (Tympanic)   Ht 5\' 8"  (1.727 m)   Wt 214 lb 6.4 oz (97.3 kg)   SpO2 96%   BMI 32.60 kg/m  Wt Readings from Last 3 Encounters:  01/16/20 214 lb 6.4 oz (97.3 kg)  10/30/19 213 lb 8 oz (96.8 kg)  07/30/19 217 lb 6.4 oz (98.6 kg)     Health Maintenance Due  Topic Date Due  . COLONOSCOPY  01/01/2018    There are no preventive care reminders to display for this patient.  Lab Results  Component Value Date   TSH 2.02 07/30/2019   Lab Results  Component Value Date   WBC 5.3 07/30/2019   HGB 14.5 07/30/2019   HCT 41.9 07/30/2019   MCV 96.0 07/30/2019   PLT 346.0 07/30/2019   Lab Results  Component Value Date   NA 142 10/30/2019   K 4.7 10/30/2019   CO2 27 10/30/2019   GLUCOSE 96 10/30/2019   BUN 23 10/30/2019   CREATININE 0.99 10/30/2019   BILITOT 1.3 (H) 12/31/2018   ALKPHOS 75 12/31/2018   AST 21 12/31/2018   ALT 29 12/31/2018   PROT 6.4 12/31/2018   ALBUMIN 4.6 12/31/2018   CALCIUM 9.1 10/30/2019   GFR 72.77 10/30/2019   Lab Results  Component Value Date   CHOL 137 12/31/2018   Lab Results  Component Value Date   HDL 40.40 12/31/2018   Lab Results  Component Value Date   LDLCALC 70 12/31/2018   Lab Results  Component Value Date   TRIG 134.0 12/31/2018   Lab Results  Component Value Date   CHOLHDL 3 12/31/2018   Lab Results  Component Value Date   HGBA1C 5.4 10/30/2019      Assessment & Plan:   Problem List Items Addressed This Visit      Other   Hyperlipidemia   Relevant Orders   Lipid panel   Need for influenza vaccination - Primary   Relevant Orders   Flu Vaccine QUAD High Dose(Fluad) (Completed)   Fall   Relevant Orders   DG Ribs Unilateral Right      No orders of the defined types were placed in this encounter.   Follow-up: Return in about 3 months (around 04/17/2020), or if symptoms worsen or fail to improve.  Given information on fall  prevention.  Continue taking levothyroxine on a fasting stomach 30 minutes before eating.  He will continue taking Aleve for his chest wall pain.   Libby Maw, MD

## 2020-01-16 NOTE — Patient Instructions (Addendum)

## 2020-01-21 ENCOUNTER — Telehealth: Payer: Self-pay | Admitting: Family Medicine

## 2020-01-21 NOTE — Telephone Encounter (Signed)
Patient is calling and wanted to see if his x ray results were back, please advise. CB is [phone]

## 2020-01-31 ENCOUNTER — Ambulatory Visit: Payer: Medicare Other | Admitting: Family Medicine

## 2020-01-31 ENCOUNTER — Other Ambulatory Visit: Payer: Self-pay

## 2020-01-31 ENCOUNTER — Emergency Department (HOSPITAL_BASED_OUTPATIENT_CLINIC_OR_DEPARTMENT_OTHER): Payer: Medicare Other

## 2020-01-31 ENCOUNTER — Emergency Department (HOSPITAL_BASED_OUTPATIENT_CLINIC_OR_DEPARTMENT_OTHER)
Admission: EM | Admit: 2020-01-31 | Discharge: 2020-01-31 | Disposition: A | Discharge status: Home / Self Care | Payer: Medicare Other | Attending: Emergency Medicine | Admitting: Emergency Medicine

## 2020-01-31 ENCOUNTER — Encounter (HOSPITAL_BASED_OUTPATIENT_CLINIC_OR_DEPARTMENT_OTHER): Payer: Self-pay | Admitting: *Deleted

## 2020-01-31 DIAGNOSIS — R059 Cough, unspecified: Secondary | ICD-10-CM | POA: Diagnosis not present

## 2020-01-31 DIAGNOSIS — Z8546 Personal history of malignant neoplasm of prostate: Secondary | ICD-10-CM | POA: Insufficient documentation

## 2020-01-31 DIAGNOSIS — J209 Acute bronchitis, unspecified: Secondary | ICD-10-CM | POA: Insufficient documentation

## 2020-01-31 DIAGNOSIS — Z79899 Other long term (current) drug therapy: Secondary | ICD-10-CM | POA: Diagnosis not present

## 2020-01-31 DIAGNOSIS — I10 Essential (primary) hypertension: Secondary | ICD-10-CM | POA: Insufficient documentation

## 2020-01-31 DIAGNOSIS — I517 Cardiomegaly: Secondary | ICD-10-CM | POA: Diagnosis not present

## 2020-01-31 DIAGNOSIS — Z87891 Personal history of nicotine dependence: Secondary | ICD-10-CM | POA: Insufficient documentation

## 2020-01-31 DIAGNOSIS — J90 Pleural effusion, not elsewhere classified: Secondary | ICD-10-CM | POA: Diagnosis not present

## 2020-01-31 MED ORDER — ALBUTEROL SULFATE HFA 108 (90 BASE) MCG/ACT IN AERS
2.0000 | INHALATION_SPRAY | Freq: Once | RESPIRATORY_TRACT | Status: AC
  Administered 2020-01-31: 2 via RESPIRATORY_TRACT
  Filled 2020-01-31: qty 6.7

## 2020-01-31 MED ORDER — AZITHROMYCIN 250 MG PO TABS: 250.0000 mg | ORAL_TABLET | Freq: Every day | ORAL | 0 refills | Status: DC

## 2020-01-31 NOTE — ED Provider Notes (Signed)
[place] EMERGENCY DEPARTMENT Provider Note   CSN: [id number] Arrival date & time: 01/31/20  1738     History Chief Complaint  Patient presents with   Cough    Travis Schwartz is a 79 y.o. male.  He is complaining of some cough for the last 3 days productive of some dark sputum.  He is also had wheezing more at night that his wife is noticed.  Does not usually have any pulmonary problems.  No fever.  Says he fell couple weeks ago and was told he had a fractured rib.  No hemoptysis.  No chest pain.  No leg swelling or leg pain.  Does not feel short of breath.  The history is provided by the patient.  Cough Cough characteristics:  Productive Sputum characteristics:  Yellow and brown Severity:  Moderate Onset quality:  Gradual Timing:  Intermittent Progression:  Unchanged Chronicity:  New Smoker: no   Relieved by:  None tried Worsened by:  Nothing Ineffective treatments:  None tried Associated symptoms: wheezing   Associated symptoms: no chest pain, no fever, no headaches, no rash, no rhinorrhea, no shortness of breath and no sore throat        Past Medical History:  Diagnosis Date   Allergy    no per pt   Arthritis    Cancer ([place])    prostate   Diverticulosis    2008   GERD (gastroesophageal reflux disease)    Hiatal hernia    Hyperlipidemia    Prostate cancer [facility]) 1992    Patient Active Problem List   Diagnosis Date Noted   Fall 01/16/2020   Trochanteric bursitis of left hip 10/30/2019   Elevated glucose 10/30/2019   Left hip pain 05/02/2019   Acquired hypothyroidism 01/01/2019   Abnormal TSH 12/31/2018   Need for influenza vaccination 12/31/2018   Dysuria 12/25/2017   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   Osteopenia 08/15/2017   Compression fracture of thoracic spine, non-traumatic ([place]) 07/26/2017   Essential hypertension 12/30/2014   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   ERECTILE  DYSFUNCTION 03/30/2010   DYSPHAGIA UNSPECIFIED 04/30/2009   Hyperlipidemia 01/29/2008   GERD 01/29/2008   Allergic rhinitis 01/22/2008   History of prostate cancer 11/24/2006    Past Surgical History:  Procedure Laterality Date   CATARACT EXTRACTION     both eyes   CHOLECYSTECTOMY     COLONOSCOPY     TONSILLECTOMY     TRANSURETHRAL RESECTION OF PROSTATE     UPPER GASTROINTESTINAL ENDOSCOPY         Family History  Problem Relation Age of Onset   Cancer Sister        uterine   Colon cancer Mother    Hyperlipidemia Other    COPD Other        colon   Esophageal cancer Neg Hx    Rectal cancer Neg Hx    Stomach cancer Neg Hx     Social History   Tobacco Use   Smoking status: Former Smoker    Packs/day: 2.00    Years: 3.00    Pack years: 6.00    Types: Cigarettes    Quit date: 03/22/1963    Years since quitting: 56.9   Smokeless tobacco: Former Systems developer  Substance Use Topics   Alcohol use: Yes    Alcohol/week: 0.0 standard drinks    Comment: rarely   Drug use: No    Home Medications Prior to Admission medications   Medication Sig  Start Date End Date Taking? Authorizing Provider  atorvastatin (LIPITOR) 40 MG tablet TAKE 1 TABLET BY MOUTH EVERY DAY 12/16/19   Libby Maw, MD  diclofenac Sodium (VOLTAREN) 1 % GEL May apply a dab up to 4 times daily. Patient not taking: Reported on 07/30/2019 05/02/19   Libby Maw, MD  hydrochlorothiazide (HYDRODIURIL) 12.5 MG tablet TAKE 1 TABLET(12.5 MG) BY MOUTH DAILY 09/16/19   Libby Maw, MD  levothyroxine (SYNTHROID) 75 MCG tablet TAKE 1 TABLET(75 MCG) BY MOUTH DAILY BEFORE BREAKFAST 11/10/19   Libby Maw, MD  meloxicam (MOBIC) 15 MG tablet Take 15 mg by mouth daily. Patient not taking: Reported on 10/30/2019 09/15/19   [provider]  omeprazole (PRILOSEC) 20 MG capsule TAKE 1 CAPSULE BY MOUTH DAILY 12/16/19   Libby Maw, MD    Allergies      Patient has no known allergies.  Review of Systems   Review of Systems  Constitutional: Negative for fever.  HENT: Negative for rhinorrhea and sore throat.   Eyes: Negative for visual disturbance.  Respiratory: Positive for cough and wheezing. Negative for shortness of breath.   Cardiovascular: Negative for chest pain.  Gastrointestinal: Negative for abdominal pain.  Genitourinary: Negative for dysuria.  Musculoskeletal: Negative for neck pain.  Skin: Negative for rash.  Neurological: Negative for headaches.    Physical Exam Updated Vital Signs BP (!) 141/66    Pulse 66    Temp 98 F (36.7 C) (Oral)    Resp 20    Ht 5\' 8"  (1.727 m)    Wt 98.9 kg    SpO2 100%    BMI 33.15 kg/m   Physical Exam Vitals and nursing note reviewed.  Constitutional:      Appearance: Normal appearance. He is well-developed.  HENT:     Head: Normocephalic and atraumatic.  Eyes:     Conjunctiva/sclera: Conjunctivae normal.  Cardiovascular:     Rate and Rhythm: Normal rate and regular rhythm.     Heart sounds: No murmur heard.   Pulmonary:     Effort: Pulmonary effort is normal. No respiratory distress.     Breath sounds: Normal breath sounds.  Abdominal:     Palpations: Abdomen is soft.     Tenderness: There is no abdominal tenderness.  Musculoskeletal:        General: No deformity or signs of injury. Normal range of motion.     Cervical back: Neck supple.     Right lower leg: No edema.     Left lower leg: No edema.  Skin:    General: Skin is warm and dry.  Neurological:     General: No focal deficit present.     Mental Status: He is alert.     ED Results / Procedures / Treatments   Labs (all labs ordered are listed, but only abnormal results are displayed) Labs Reviewed - No data to display  EKG None  Radiology DG Chest Portable 1 View  Result Date: 01/31/2020 CLINICAL DATA:  Cough history of recent rib fracture EXAM: PORTABLE CHEST 1 VIEW COMPARISON:  01/16/2020 FINDINGS: No  focal opacity or pleural effusion. Borderline to mild cardiomegaly. No edema. Negative for pneumothorax. IMPRESSION: No active disease. Electronically Signed   By: Donavan Foil M.D.   On: 01/31/2020 18:23    Procedures Procedures (including critical care time)  Medications Ordered in ED Medications  albuterol (VENTOLIN HFA) 108 (90 Base) MCG/ACT inhaler 2 puff (has no administration in time range)  ED Course  I have reviewed the triage vital signs and the nursing notes.  Pertinent labs & imaging results that were available during my care of the patient were reviewed by me and considered in my medical decision making (see chart for details).  Clinical Course as of Jan 31 945  [facility] Jan 31, 2020  1947 Received some MDI teaching and 2 puffs of albuterol by respiratory. He says he feels better with his breathing already. We will put him on antibiotics for possible bronchitis. Return instructions discussed   [MB]    Clinical Course User Index [MB] Hayden Rasmussen, MD   MDM Rules/Calculators/A&P                         79 year old male with recent trauma to his chest wall complaining of cough productive of sputum and some shortness of breath.  Differential includes pneumonia, pneumothorax, bronchitis, PE, pleural effusion.  X-ray interpreted by me as no pneumothorax no effusion no pneumonia.  Albuterol with improvement in shortness of breath.  Due to productive sputum will cover with antibiotics for possible bronchitis.  Patient comfortable plan and will follow up with PCP.  Return instructions discussed.  Final Clinical Impression(s) / ED Diagnoses Final diagnoses:  Acute bronchitis, unspecified organism    Rx / DC Orders ED Discharge Orders    None       Hayden Rasmussen, MD 02/01/20 [phone]

## 2020-01-31 NOTE — ED Triage Notes (Addendum)
Cough x 3 days. Wheezing. Hx of fall with a right rib fracture 2 weeks ago.

## 2020-01-31 NOTE — Discharge Instructions (Addendum)
You were seen in the emergency department for some wheezing and shortness of breath. Your chest x-ray did not show an obvious pneumonia. Your symptoms improved with a breathing treatment. You can use the inhaler 2 puffs every 4-6 hours as needed for shortness of breath/wheezing. Please finish your antibiotics. Follow-up with your doctor. Return to the emergency department if any worsening or concerning symptoms.

## 2020-02-05 ENCOUNTER — Other Ambulatory Visit: Payer: Self-pay | Admitting: Family Medicine

## 2020-02-05 DIAGNOSIS — E039 Hypothyroidism, unspecified: Secondary | ICD-10-CM

## 2020-02-07 ENCOUNTER — Telehealth: Payer: Self-pay

## 2020-02-07 MED ORDER — PREDNISONE 20 MG PO TABS: 40.0000 mg | ORAL_TABLET | Freq: Every day | ORAL | 0 refills | Status: AC

## 2020-02-07 MED ORDER — BENZONATATE 100 MG PO CAPS: 100.0000 mg | ORAL_CAPSULE | Freq: Three times a day (TID) | ORAL | 0 refills | Status: DC | PRN

## 2020-02-07 NOTE — Telephone Encounter (Signed)
Pt calling in regards of recent ER visit, on Saturday for coughing and wheezing.  Pt said he was prescribed medication and DX w/Bronchitis.  Pt explained that he is still coughing up phlegm and having the wheezing.   Pt would like something sent in to help with his cough and wheezing. Please advise.

## 2020-02-07 NOTE — Telephone Encounter (Signed)
Informed pt of message below

## 2020-02-07 NOTE — Telephone Encounter (Signed)
Rx for tessalon and prednisone 40mg  daily x 5 days sent to pharm If no improvement in the next few days or if symptoms worsen, pt needs appt for in-person eval

## 2020-02-10 ENCOUNTER — Ambulatory Visit: Payer: Medicare Other

## 2020-03-06 ENCOUNTER — Ambulatory Visit: Payer: Medicare Other | Attending: Internal Medicine

## 2020-03-06 ENCOUNTER — Other Ambulatory Visit (HOSPITAL_BASED_OUTPATIENT_CLINIC_OR_DEPARTMENT_OTHER): Payer: Self-pay | Admitting: Internal Medicine

## 2020-03-06 DIAGNOSIS — Z23 Encounter for immunization: Secondary | ICD-10-CM

## 2020-03-06 NOTE — Progress Notes (Signed)
   Covid-19 Vaccination Clinic  Name:  Travis Schwartz    MRN: [id number] DOB: 20-Jul-1940  03/06/2020  [name] was observed post Covid-19 immunization for 15 minutes without incident. He was provided with Vaccine Information Sheet and instruction to access the V-Safe system.   [name] was instructed to call 911 with any severe reactions post vaccine: Marland Kitchen Difficulty breathing  . Swelling of face and throat  . A fast heartbeat  . A bad rash all over body  . Dizziness and weakness   Immunizations Administered    Name Date Dose VIS Date Route   Pfizer COVID-19 Vaccine 03/06/2020  9:53 AM 0.3 mL 01/08/2020 Intramuscular   Manufacturer: [place]   Lot: [id number]   [place]: [id number]

## 2020-03-09 MED FILL — PFIZER-BIONTECH COVID-19 VA: 30 | 1 days supply | Qty: 0 | Fill #0

## 2020-03-13 ENCOUNTER — Other Ambulatory Visit: Payer: Self-pay | Admitting: Family Medicine

## 2020-03-13 DIAGNOSIS — Z0000 Encounter for general adult medical examination without abnormal findings: Secondary | ICD-10-CM

## 2020-03-13 DIAGNOSIS — E785 Hyperlipidemia, unspecified: Secondary | ICD-10-CM

## 2020-03-13 DIAGNOSIS — I10 Essential (primary) hypertension: Secondary | ICD-10-CM

## 2020-04-20 ENCOUNTER — Ambulatory Visit: Payer: Medicare Other | Admitting: Family Medicine

## 2020-05-10 ENCOUNTER — Other Ambulatory Visit: Payer: Self-pay | Admitting: Family Medicine

## 2020-05-10 DIAGNOSIS — E039 Hypothyroidism, unspecified: Secondary | ICD-10-CM

## 2020-05-11 NOTE — Telephone Encounter (Signed)
Last OV 01/16/20 Last fill 02/05/20 #90/0 Next OV 05/25/20

## 2020-05-25 ENCOUNTER — Other Ambulatory Visit: Payer: Self-pay

## 2020-05-25 ENCOUNTER — Ambulatory Visit (INDEPENDENT_AMBULATORY_CARE_PROVIDER_SITE_OTHER): Payer: Medicare Other | Admitting: Family Medicine

## 2020-05-25 ENCOUNTER — Encounter: Payer: Self-pay | Admitting: Family Medicine

## 2020-05-25 VITALS — BP 146/72 | HR 85 | Temp 97.8°F | Ht 68.0 in | Wt 217.0 lb

## 2020-05-25 DIAGNOSIS — I998 Other disorder of circulatory system: Secondary | ICD-10-CM | POA: Diagnosis not present

## 2020-05-25 DIAGNOSIS — E039 Hypothyroidism, unspecified: Secondary | ICD-10-CM | POA: Diagnosis not present

## 2020-05-25 DIAGNOSIS — I10 Essential (primary) hypertension: Secondary | ICD-10-CM | POA: Diagnosis not present

## 2020-05-25 LAB — BASIC METABOLIC PANEL
BUN: 20 mg/dL (ref 6–23)
CO2: 27 mEq/L (ref 19–32)
Calcium: 9.2 mg/dL (ref 8.4–10.5)
Chloride: 104 mEq/L (ref 96–112)
Creatinine, Ser: 0.91 mg/dL (ref 0.40–1.50)
GFR: 79.68 mL/min (ref >60.00)
Glucose, Bld: 135 mg/dL — ABNORMAL HIGH (ref 70–99)
Potassium: 3.8 mEq/L (ref 3.5–5.1)
Sodium: 139 mEq/L (ref 135–145)

## 2020-05-25 LAB — TSH: TSH: 3.21 u[IU]/mL (ref 0.35–4.50)

## 2020-05-25 MED ORDER — HYDROCHLOROTHIAZIDE 25 MG PO TABS: 25.0000 mg | ORAL_TABLET | Freq: Every day | ORAL | 3 refills | Status: DC

## 2020-05-25 NOTE — Patient Instructions (Signed)
Managing Your Hypertension Hypertension, also called high blood pressure, is when the force of the blood pressing against the walls of the arteries is too strong. Arteries are blood vessels that carry blood from your heart throughout your body. Hypertension forces the heart to work harder to pump blood and may cause the arteries to become narrow or stiff. Understanding blood pressure readings Your personal target blood pressure may vary depending on your medical conditions, your age, and other factors. A blood pressure reading includes a higher number over a lower number. Ideally, your blood pressure should be below 120/80. You should know that:  The first, or top, number is called the systolic pressure. It is a measure of the pressure in your arteries as your heart beats.  The second, or bottom number, is called the diastolic pressure. It is a measure of the pressure in your arteries as the heart relaxes. Blood pressure is classified into four stages. Based on your blood pressure reading, your health care provider may use the following stages to determine what type of treatment you need, if any. Systolic pressure and diastolic pressure are measured in a unit called mmHg. Normal  Systolic pressure: below 120.  Diastolic pressure: below 80. Elevated  Systolic pressure: 120-129.  Diastolic pressure: below 80. Hypertension stage 1  Systolic pressure: 130-139.  Diastolic pressure: 80-89. Hypertension stage 2  Systolic pressure: 140 or above.  Diastolic pressure: 90 or above. How can this condition affect me? Managing your hypertension is an important responsibility. Over time, hypertension can damage the arteries and decrease blood flow to important parts of the body, including the brain, heart, and kidneys. Having untreated or uncontrolled hypertension can lead to:  A heart attack.  A stroke.  A weakened blood vessel (aneurysm).  Heart failure.  Kidney damage.  Eye  damage.  Metabolic syndrome.  Memory and concentration problems.  Vascular dementia. What actions can I take to manage this condition? Hypertension can be managed by making lifestyle changes and possibly by taking medicines. Your health care provider will help you make a plan to bring your blood pressure within a normal range. Nutrition  Eat a diet that is high in fiber and potassium, and low in salt (sodium), added sugar, and fat. An example eating plan is called the Dietary Approaches to Stop Hypertension (DASH) diet. To eat this way: ? Eat plenty of fresh fruits and vegetables. Try to fill one-half of your plate at each meal with fruits and vegetables. ? Eat whole grains, such as whole-wheat pasta, brown rice, or whole-grain bread. Fill about one-fourth of your plate with whole grains. ? Eat low-fat dairy products. ? Avoid fatty cuts of meat, processed or cured meats, and poultry with skin. Fill about one-fourth of your plate with lean proteins such as fish, chicken without skin, beans, eggs, and tofu. ? Avoid pre-made and processed foods. These tend to be higher in sodium, added sugar, and fat.  Reduce your daily sodium intake. Most people with hypertension should eat less than 1,500 mg of sodium a day.   Lifestyle  Work with your health care provider to maintain a healthy body weight or to lose weight. Ask what an ideal weight is for you.  Get at least 30 minutes of exercise that causes your heart to beat faster (aerobic exercise) most days of the week. Activities may include walking, swimming, or biking.  Include exercise to strengthen your muscles (resistance exercise), such as weight lifting, as part of your weekly exercise routine. Try   to do these types of exercises for 30 minutes at least 3 days a week.  Do not use any products that contain nicotine or tobacco, such as cigarettes, e-cigarettes, and chewing tobacco. If you need help quitting, ask your health care  provider.  Control any long-term (chronic) conditions you have, such as high cholesterol or diabetes.  Identify your sources of stress and find ways to manage stress. This may include meditation, deep breathing, or making time for fun activities.   Alcohol use  Do not drink alcohol if: ? Your health care provider tells you not to drink. ? You are pregnant, may be pregnant, or are planning to become pregnant.  If you drink alcohol: ? Limit how much you use to:  0-1 drink a day for women.  0-2 drinks a day for men. ? Be aware of how much alcohol is in your drink. In the [place] one drink equals one 12 oz bottle of beer (355 mL), one 5 oz glass of wine (148 mL), or one 1 oz glass of hard liquor (44 mL). Medicines Your health care provider may prescribe medicine if lifestyle changes are not enough to get your blood pressure under control and if:  Your systolic blood pressure is 130 or higher.  Your diastolic blood pressure is 80 or higher. Take medicines only as told by your health care provider. Follow the directions carefully. Blood pressure medicines must be taken as told by your health care provider. The medicine does not work as well when you skip doses. Skipping doses also puts you at risk for problems. Monitoring Before you monitor your blood pressure:  Do not smoke, drink caffeinated beverages, or exercise within 30 minutes before taking a measurement.  Use the bathroom and empty your bladder (urinate).  Sit quietly for at least 5 minutes before taking measurements. Monitor your blood pressure at home as told by your health care provider. To do this:  Sit with your back straight and supported.  Place your feet flat on the floor. Do not cross your legs.  Support your arm on a flat surface, such as a table. Make sure your upper arm is at heart level.  Each time you measure, take two or three readings one minute apart and record the results. You may also need to have your  blood pressure checked regularly by your health care provider.   General information  Talk with your health care provider about your diet, exercise habits, and other lifestyle factors that may be contributing to hypertension.  Review all the medicines you take with your health care provider because there may be side effects or interactions.  Keep all visits as told by your health care provider. Your health care provider can help you create and adjust your plan for managing your high blood pressure. Where to find more information  National Heart, Lung, and Blood Institute: www.nhlbi.nih.gov  American Heart Association: www.heart.org Contact a health care provider if:  You think you are having a reaction to medicines you have taken.  You have repeated (recurrent) headaches.  You feel dizzy.  You have swelling in your ankles.  You have trouble with your vision. Get help right away if:  You develop a severe headache or confusion.  You have unusual weakness or numbness, or you feel faint.  You have severe pain in your chest or abdomen.  You vomit repeatedly.  You have trouble breathing. These symptoms may represent a serious problem that is an emergency. Do not wait   to see if the symptoms will go away. Get medical help right away. Call your local emergency services (911 in the [place] Do not drive yourself to the hospital. Summary  Hypertension is when the force of blood pumping through your arteries is too strong. If this condition is not controlled, it may put you at risk for serious complications.  Your personal target blood pressure may vary depending on your medical conditions, your age, and other factors. For most people, a normal blood pressure is less than 120/80.  Hypertension is managed by lifestyle changes, medicines, or both.  Lifestyle changes to help manage hypertension include losing weight, eating a healthy, low-sodium diet, exercising more, stopping smoking, and  limiting alcohol. This information is not intended to replace advice given to you by your health care provider. Make sure you discuss any questions you have with your health care provider. Document Revised: 04/12/2019 Document Reviewed: 02/05/2019 Elsevier Patient Education  2021 [employer]  

## 2020-05-25 NOTE — Progress Notes (Signed)
Established Patient Office Visit  Subjective:  Patient ID: Travis Schwartz, male    DOB: May 11, 1940  Age: 80 y.o. MRN: [id number]  CC:  Chief Complaint  Patient presents with  . Follow-up    3 month follow up on thyroid medication. No concerns.     HPI Travis Schwartz presents for follow-up of hypertension, hypothyroidism and follow-up.  Has not fallen recently.  Continues to take pends Levoxyl daily in the morning 1 hour before eating.  Does not check his blood pressure regularly.  Denies headaches or blurred vision.  Denies cold intolerance or constipation.  Past Medical History:  Diagnosis Date  . Allergy    no per pt  . Arthritis   . Cancer [facility])    prostate  . Diverticulosis    2008  . GERD (gastroesophageal reflux disease)   . Hiatal hernia   . Hyperlipidemia   . Prostate cancer ([place]) 1992    Past Surgical History:  Procedure Laterality Date  . CATARACT EXTRACTION     both eyes  . CHOLECYSTECTOMY    . COLONOSCOPY    . TONSILLECTOMY    . TRANSURETHRAL RESECTION OF PROSTATE    . UPPER GASTROINTESTINAL ENDOSCOPY      Family History  Problem Relation Age of Onset  . Cancer Sister        uterine  . Colon cancer Mother   . Hyperlipidemia Other   . COPD Other        colon  . Esophageal cancer Neg Hx   . Rectal cancer Neg Hx   . Stomach cancer Neg Hx     Social History   Socioeconomic History  . Marital status: Married    Spouse name: Not on file  . Number of children: Not on file  . Years of education: Not on file  . Highest education level: Not on file  Occupational History  . Not on file  Tobacco Use  . Smoking status: Former Smoker    Packs/day: 2.00    Years: 3.00    Pack years: 6.00    Types: Cigarettes    Quit date: 03/22/1963    Years since quitting: 57.2  . Smokeless tobacco: Former Network engineer and Sexual Activity  . Alcohol use: Yes    Alcohol/week: 0.0 standard drinks    Comment: rarely  . Drug use: No  . Sexual activity: Not  on file  Other Topics Concern  . Not on file  Social History Narrative   Lives with wife, son, and granddaughter in a 2 story home.  1 child.     Previously worked as a Administrator for 30 years.     High school education.   Social Determinants of Health   Financial Resource Strain: Low Risk   . Difficulty of Paying Living Expenses: Not hard at all  Food Insecurity: No Food Insecurity  . Worried About Charity fundraiser in the Last Year: Never true  . Ran Out of Food in the Last Year: Never true  Transportation Needs: No Transportation Needs  . Lack of Transportation (Medical): No  . Lack of Transportation (Non-Medical): No  Physical Activity: Not on file  Stress: Not on file  Social Connections: Not on file  Intimate Partner Violence: Not on file    Outpatient Medications Prior to Visit  Medication Sig Dispense Refill  . atorvastatin (LIPITOR) 40 MG tablet TAKE 1 TABLET BY MOUTH EVERY DAY 90 tablet 0  . levothyroxine (SYNTHROID)  75 MCG tablet TAKE 1 TABLET(75 MCG) BY MOUTH DAILY BEFORE BREAKFAST 90 tablet 0  . omeprazole (PRILOSEC) 20 MG capsule TAKE 1 CAPSULE BY MOUTH DAILY 90 capsule 0  . hydrochlorothiazide (HYDRODIURIL) 12.5 MG tablet TAKE 1 TABLET(12.5 MG) BY MOUTH DAILY 90 tablet 1  . azithromycin (ZITHROMAX) 250 MG tablet Take 1 tablet (250 mg total) by mouth daily. Take first 2 tablets together, then 1 every day until finished. (Patient not taking: Reported on 05/25/2020) 6 tablet 0  . benzonatate (TESSALON) 100 MG capsule Take 1 capsule (100 mg total) by mouth 3 (three) times daily as needed for cough. (Patient not taking: Reported on 05/25/2020) 30 capsule 0  . diclofenac Sodium (VOLTAREN) 1 % GEL May apply a dab up to 4 times daily. (Patient not taking: No sig reported) 150 g 0  . meloxicam (MOBIC) 15 MG tablet Take 15 mg by mouth daily. (Patient not taking: No sig reported)     No facility-administered medications prior to visit.    No Known Allergies  ROS Review of  Systems  Constitutional: Negative.   HENT: Negative.   Eyes: Negative for photophobia and visual disturbance.  Respiratory: Negative.   Cardiovascular: Negative for chest pain and leg swelling.  Gastrointestinal: Negative.  Negative for constipation.  Musculoskeletal: Negative for gait problem and joint swelling.  Skin: Negative.   Allergic/Immunologic: Negative for immunocompromised state.  Hematological: Does not bruise/bleed easily.  Psychiatric/Behavioral: Negative.       Objective:    Physical Exam Vitals and nursing note reviewed.  Constitutional:      General: He is not in acute distress.    Appearance: Normal appearance. He is not ill-appearing, toxic-appearing or diaphoretic.  HENT:     Head: Normocephalic and atraumatic.     Right Ear: Tympanic membrane, ear canal and external ear normal.     Left Ear: Tympanic membrane, ear canal and external ear normal.     Mouth/Throat:     Mouth: Mucous membranes are moist.     Pharynx: Oropharynx is clear. No oropharyngeal exudate or posterior oropharyngeal erythema.  Eyes:     General: No scleral icterus.       Right eye: No discharge.        Left eye: No discharge.     Extraocular Movements: Extraocular movements intact.     Conjunctiva/sclera: Conjunctivae normal.     Pupils: Pupils are equal, round, and reactive to light.  Cardiovascular:     Rate and Rhythm: Normal rate and regular rhythm.  Pulmonary:     Effort: Pulmonary effort is normal.     Breath sounds: Normal breath sounds.  Abdominal:     General: Bowel sounds are normal.  Musculoskeletal:     Right lower leg: No edema.     Left lower leg: No edema.  Skin:    General: Skin is warm and dry.  Neurological:     Mental Status: He is alert and oriented to person, place, and time.  Psychiatric:        Mood and Affect: Mood normal.        Behavior: Behavior normal.     BP (!) 146/72   Pulse 85   Temp 97.8 F (36.6 C) (Temporal)   Ht 5\' 8"  (1.727 m)    Wt 217 lb (98.4 kg)   SpO2 95%   BMI 32.99 kg/m  Wt Readings from Last 3 Encounters:  05/25/20 217 lb (98.4 kg)  01/31/20 218 lb (98.9 kg)  01/16/20  214 lb 6.4 oz (97.3 kg)     Health Maintenance Due  Topic Date Due  . COLONOSCOPY (Pts 45-49yrs Insurance coverage will need to be confirmed)  01/01/2018    There are no preventive care reminders to display for this patient.  Lab Results  Component Value Date   TSH 2.02 07/30/2019   Lab Results  Component Value Date   WBC 5.3 07/30/2019   HGB 14.5 07/30/2019   HCT 41.9 07/30/2019   MCV 96.0 07/30/2019   PLT 346.0 07/30/2019   Lab Results  Component Value Date   NA 142 10/30/2019   K 4.7 10/30/2019   CO2 27 10/30/2019   GLUCOSE 96 10/30/2019   BUN 23 10/30/2019   CREATININE 0.99 10/30/2019   BILITOT 1.3 (H) 12/31/2018   ALKPHOS 75 12/31/2018   AST 21 12/31/2018   ALT 29 12/31/2018   PROT 6.4 12/31/2018   ALBUMIN 4.6 12/31/2018   CALCIUM 9.1 10/30/2019   GFR 72.77 10/30/2019   Lab Results  Component Value Date   CHOL 143 01/16/2020   Lab Results  Component Value Date   HDL 38.60 (L) 01/16/2020   Lab Results  Component Value Date   LDLCALC 75 01/16/2020   Lab Results  Component Value Date   TRIG 146.0 01/16/2020   Lab Results  Component Value Date   CHOLHDL 4 01/16/2020   Lab Results  Component Value Date   HGBA1C 5.4 10/30/2019      Assessment & Plan:   Problem List Items Addressed This Visit      Cardiovascular and Mediastinum   Essential hypertension - Primary   Relevant Medications   hydrochlorothiazide (HYDRODIURIL) 25 MG tablet   Other Relevant Orders   Basic metabolic panel     Endocrine   Acquired hypothyroidism   Relevant Orders   TSH     Other   Poorly controlled blood pressure   Relevant Medications   hydrochlorothiazide (HYDRODIURIL) 25 MG tablet      Meds ordered this encounter  Medications  . hydrochlorothiazide (HYDRODIURIL) 25 MG tablet    Sig: Take 1 tablet  (25 mg total) by mouth daily.    Dispense:  90 tablet    Refill:  3    Follow-up: Return in about 3 months (around 08/25/2020).   We discussed that 12.5 mg of HCTZ is more of a homeopathic dose and I think he would tolerate the 25 mg well.  Asked him to start checking his blood pressure.  Discussed using the Levoxyl every morning an hour before eating. Libby Maw, MD

## 2020-06-11 ENCOUNTER — Other Ambulatory Visit: Payer: Self-pay | Admitting: Family

## 2020-06-11 DIAGNOSIS — Z0000 Encounter for general adult medical examination without abnormal findings: Secondary | ICD-10-CM

## 2020-06-11 DIAGNOSIS — E785 Hyperlipidemia, unspecified: Secondary | ICD-10-CM

## 2020-06-12 ENCOUNTER — Encounter: Payer: Self-pay | Admitting: Family Medicine

## 2020-06-12 ENCOUNTER — Ambulatory Visit (INDEPENDENT_AMBULATORY_CARE_PROVIDER_SITE_OTHER): Payer: Medicare Other | Admitting: Family Medicine

## 2020-06-12 ENCOUNTER — Other Ambulatory Visit: Payer: Self-pay

## 2020-06-12 VITALS — BP 128/92 | HR 109 | Temp 97.6°F

## 2020-06-12 DIAGNOSIS — I10 Essential (primary) hypertension: Secondary | ICD-10-CM

## 2020-06-12 DIAGNOSIS — Z711 Person with feared health complaint in whom no diagnosis is made: Secondary | ICD-10-CM | POA: Diagnosis not present

## 2020-06-12 DIAGNOSIS — R34 Anuria and oliguria: Secondary | ICD-10-CM

## 2020-06-12 LAB — POCT URINALYSIS DIPSTICK
Bilirubin, UA: NEGATIVE
Glucose, UA: NEGATIVE
Ketones, UA: NEGATIVE
Nitrite, UA: NEGATIVE
Protein, UA: POSITIVE — AB
Spec Grav, UA: 1.02 (ref 1.010–1.025)
Urobilinogen, UA: NEGATIVE E.U./dL — AB
pH, UA: 6 (ref 5.0–8.0)

## 2020-06-12 NOTE — Addendum Note (Signed)
Addended by: Janann Colonel on: 06/12/2020 02:06 PM   Modules accepted: Orders

## 2020-06-12 NOTE — Progress Notes (Signed)
Subjective:    Patient ID: Travis Schwartz, male    DOB: 1940/08/15, 80 y.o.   MRN: [id number]  No chief complaint on file.   HPI Patient is an 80 year old male with past medical history significant for prostate cancer, former smoker, arthritis, history of diverticulosis, GERD, HLD was followed by Dr. Ethelene Hal and seen for acute concern.  Patient endorses urge to urinate but little to nothing comes out times the last 3 days.  Patient denies dysuria, back pain, nausea, vomiting, fever.  Patient notes occasional constipation.  Last BM was today.  Drinking 116 ounce bottle of water per day in addition to 8 ounces of coffee in 16 ounces of diet soda.  Last follow-up with urology was 3 years ago.  Has PSA checked regularly with PCP.  Notes BP is better today than at previous office visits.  Past Medical History:  Diagnosis Date  . Allergy    no per pt  . Arthritis   . Cancer [facility])    prostate  . Diverticulosis    2008  . GERD (gastroesophageal reflux disease)   . Hiatal hernia   . Hyperlipidemia   . Prostate cancer ([place]) 1992    No Known Allergies  ROS General: Denies fever, chills, night sweats, changes in weight, changes in appetite HEENT: Denies headaches, ear pain, changes in vision, rhinorrhea, sore throat CV: Denies CP, palpitations, SOB, orthopnea Pulm: Denies SOB, cough, wheezing GI: Denies abdominal pain, nausea, vomiting, diarrhea, constipation GU: Denies dysuria, hematuria, frequency  + frequency with little volume Msk: Denies muscle cramps, joint pains Neuro: Denies weakness, numbness, tingling Skin: Denies rashes, bruising Psych: Denies depression, anxiety, hallucinations     Objective:    Blood pressure (!) 128/92, pulse (!) 109, temperature 97.6 F (36.4 C), temperature source Oral, SpO2 97 %.  Gen. Pleasant, well-nourished, in no distress, normal affect  HEENT: [place]/AT, face symmetric, conjunctiva clear, no scleral icterus, PERRLA, EOMI, nares patent without  drainage Lungs: no accessory muscle use Cardiovascular: Tachycardia, no peripheral edema Musculoskeletal: No deformities, no cyanosis or clubbing, normal tone Neuro:  A&Ox3, CN II-XII intact, normal gait Skin:  Warm, no lesions/ rash   Wt Readings from Last 3 Encounters:  05/25/20 217 lb (98.4 kg)  01/31/20 218 lb (98.9 kg)  01/16/20 214 lb 6.4 oz (97.3 kg)    Lab Results  Component Value Date   WBC 5.3 07/30/2019   HGB 14.5 07/30/2019   HCT 41.9 07/30/2019   PLT 346.0 07/30/2019   GLUCOSE 135 (H) 05/25/2020   CHOL 143 01/16/2020   TRIG 146.0 01/16/2020   HDL 38.60 (L) 01/16/2020   LDLDIRECT 72.0 07/30/2019   LDLCALC 75 01/16/2020   ALT 29 12/31/2018   AST 21 12/31/2018   NA 139 05/25/2020   K 3.8 05/25/2020   CL 104 05/25/2020   CREATININE 0.91 05/25/2020   BUN 20 05/25/2020   CO2 27 05/25/2020   TSH 3.21 05/25/2020   PSA 0.26 07/30/2019   HGBA1C 5.4 10/30/2019   MICROALBUR 1.4 12/31/2018    Assessment/Plan:  Concern about urinary tract disease without diagnosis - Plan: POCT urinalysis dipstick, Urine Culture  Decreased urine volume -Discussed possible causes including UTI constipation, renal calculi -UA with SG 1.020, 2+ blood, 1+ protein, 3+ leuks -Discussed increasing p.o. intake of water -We will obtain urine culture -We will send in Rx for antibiotic if needed based on culture -Given precautions - Plan: Urine Culture  Hypertension -Elevated -Patient encouraged to increase p.o. intake of water and  other lifestyle modifications -Continue current medications and follow-up with PCP  F/u prn  Grier Mitts, MD

## 2020-06-14 LAB — URINE CULTURE
MICRO NUMBER:: 11693581
SPECIMEN QUALITY:: ADEQUATE

## 2020-06-16 ENCOUNTER — Telehealth: Payer: Self-pay | Admitting: Family Medicine

## 2020-06-16 DIAGNOSIS — N3000 Acute cystitis without hematuria: Secondary | ICD-10-CM

## 2020-06-16 MED ORDER — SULFAMETHOXAZOLE-TRIMETHOPRIM 800-160 MG PO TABS: 1.0000 | ORAL_TABLET | Freq: Two times a day (BID) | ORAL | 0 refills | Status: DC

## 2020-06-16 NOTE — Telephone Encounter (Signed)
Patient aware and will pick up Rx to start today.

## 2020-06-16 NOTE — Telephone Encounter (Signed)
Please advise on results

## 2020-06-16 NOTE — Telephone Encounter (Signed)
Patient is calling and stated that he was seen by the provider by never received his results, please advise. CB is [phone]

## 2020-06-16 NOTE — Telephone Encounter (Signed)
Patient calling for lab results on urine culture. Per patient he still has burning and discomfort not sure if he needs something to take for this. Urine culture in chart please advise.

## 2020-06-16 NOTE — Telephone Encounter (Signed)
Pt would like a cb concerning his most recent lab results. Please advise at [phone].

## 2020-07-06 NOTE — Progress Notes (Signed)
Subjective:   Travis Schwartz is a 80 y.o. male who presents for Medicare Annual/Subsequent preventive examination.  Review of Systems     Cardiac Risk Factors include: advanced age (>38men, >3 women);hypertension;dyslipidemia;obesity (BMI >30kg/m2)     Objective:    Today's Vitals   07/07/20 1021  BP: 118/70  Pulse: 77  Resp: 16  Temp: 97.7 F (36.5 C)  TempSrc: Temporal  SpO2: 97%  Weight: 212 lb 3.2 oz (96.3 kg)  Height: 5\' 8"  (1.727 m)   Body mass index is 32.26 kg/m.  Advanced Directives 07/07/2020 01/31/2020 07/03/2019  Does Patient Have a Medical Advance Directive? No No No  Would patient like information on creating a medical advance directive? Yes (MAU/Ambulatory/Procedural Areas - Information given) - No - Patient declined    Current Medications (verified) Outpatient Encounter Medications as of 07/07/2020  Medication Sig  . atorvastatin (LIPITOR) 40 MG tablet TAKE 1 TABLET BY MOUTH EVERY DAY  . hydrochlorothiazide (HYDRODIURIL) 25 MG tablet Take 1 tablet (25 mg total) by mouth daily.  Marland Kitchen levothyroxine (SYNTHROID) 75 MCG tablet TAKE 1 TABLET(75 MCG) BY MOUTH DAILY BEFORE BREAKFAST  . omeprazole (PRILOSEC) 20 MG capsule TAKE 1 CAPSULE BY MOUTH DAILY  . [DISCONTINUED] COVID-19 mRNA vaccine, Pfizer, 30 MCG/0.3ML injection INJECT AS DIRECTED  . [DISCONTINUED] sulfamethoxazole-trimethoprim (BACTRIM DS) 800-160 MG tablet Take 1 tablet by mouth 2 (two) times daily.   No facility-administered encounter medications on file as of 07/07/2020.    Allergies (verified) Patient has no known allergies.   History: Past Medical History:  Diagnosis Date  . Allergy    no per pt  . Arthritis   . Cancer [facility])    prostate  . Diverticulosis    2008  . GERD (gastroesophageal reflux disease)   . Hiatal hernia   . Hyperlipidemia   . Prostate cancer ([place]) 1992   Past Surgical History:  Procedure Laterality Date  . CATARACT EXTRACTION     both eyes  . CHOLECYSTECTOMY    .  COLONOSCOPY    . TONSILLECTOMY    . TRANSURETHRAL RESECTION OF PROSTATE    . UPPER GASTROINTESTINAL ENDOSCOPY     Family History  Problem Relation Age of Onset  . Cancer Sister        uterine  . Colon cancer Mother   . Hyperlipidemia Other   . COPD Other        colon  . Esophageal cancer Neg Hx   . Rectal cancer Neg Hx   . Stomach cancer Neg Hx    Social History   Socioeconomic History  . Marital status: Married    Spouse name: Not on file  . Number of children: Not on file  . Years of education: Not on file  . Highest education level: Not on file  Occupational History  . Not on file  Tobacco Use  . Smoking status: Former Smoker    Packs/day: 2.00    Years: 3.00    Pack years: 6.00    Types: Cigarettes    Quit date: 03/22/1963    Years since quitting: 57.3  . Smokeless tobacco: Former Network engineer and Sexual Activity  . Alcohol use: Yes    Alcohol/week: 0.0 standard drinks    Comment: rarely  . Drug use: No  . Sexual activity: Not on file  Other Topics Concern  . Not on file  Social History Narrative   Lives with wife, son, and granddaughter in a 2 story home.  1 child.  Previously worked as a Administrator for 30 years.     High school education.   Social Determinants of Health   Financial Resource Strain: Low Risk   . Difficulty of Paying Living Expenses: Not hard at all  Food Insecurity: No Food Insecurity  . Worried About Charity fundraiser in the Last Year: Never true  . Ran Out of Food in the Last Year: Never true  Transportation Needs: No Transportation Needs  . Lack of Transportation (Medical): No  . Lack of Transportation (Non-Medical): No  Physical Activity: Insufficiently Active  . Days of Exercise per Week: 2 days  . Minutes of Exercise per Session: 50 min  Stress: No Stress Concern Present  . Feeling of Stress : Not at all  Social Connections: Moderately Isolated  . Frequency of Communication with Friends and Family: More than three  times a week  . Frequency of Social Gatherings with Friends and Family: More than three times a week  . Attends Religious Services: Never  . Active Member of Clubs or Organizations: No  . Attends Archivist Meetings: Never  . Marital Status: Married    Tobacco Counseling Counseling given: Not Answered   Clinical Intake:  Pre-visit preparation completed: Yes  Pain : No/denies pain     Nutritional Status: BMI > 30  Obese Nutritional Risks: None Diabetes: No  How often do you need to have someone help you when you read instructions, pamphlets, or other written materials from your doctor or pharmacy?: 1 - Never  Diabetic?No  Interpreter Needed?: No  Information entered by :: Caroleen Hamman LPN   Activities of Daily Living In your present state of health, do you have any difficulty performing the following activities: 07/07/2020 10/30/2019  Hearing? N N  Vision? N N  Difficulty concentrating or making decisions? N N  Walking or climbing stairs? N N  Dressing or bathing? N N  Doing errands, shopping? N N  Preparing Food and eating ? N -  Using the Toilet? N -  In the past six months, have you accidently leaked urine? N -  Do you have problems with loss of bowel control? N -  Managing your Medications? N -  Managing your Finances? N -  Housekeeping or managing your Housekeeping? N -  Some recent data might be hidden    Patient Care Team: Libby Maw, MD as PCP - General (Family Medicine)  Indicate any recent Medical Services you may have received from other than [facility] providers in the past year (date may be approximate).     Assessment:   This is a routine wellness examination for Travis Schwartz.  Hearing/Vision screen  Hearing Screening   125Hz  250Hz  500Hz  1000Hz  2000Hz  3000Hz  4000Hz  6000Hz  8000Hz   Right ear:           Left ear:           Comments: No issues  Vision Screening Comments: Last eye exam-several years-has an upcoming appt with Dr.  Sharen Counter  Dietary issues and exercise activities discussed: Current Exercise Habits: Home exercise routine, Type of exercise: Other - see comments (yard work & home maintenance), Time (Minutes): 45, Frequency (Times/Week): 2, Weekly Exercise (Minutes/Week): 90, Intensity: Mild, Exercise limited by: None identified  Goals    . Increase physical activity    . Patient Stated     Drink more water      Depression Screen PHQ 2/9 Scores 07/07/2020 05/25/2020 01/16/2020 10/30/2019 07/03/2019 05/02/2019 12/25/2017  PHQ - 2  Score 0 0 0 0 0 0 0  PHQ- 9 Score - - - 0 - - -    Fall Risk Fall Risk  07/07/2020 05/25/2020 01/16/2020 10/30/2019 07/03/2019  Falls in the past year? 1 0 1 0 0  Number falls in past yr: 0 - 0 0 0  Injury with Fall? 0 - 0 0 0  Risk for fall due to : History of fall(s) - - - -  Follow up Falls prevention discussed - - Falls evaluation completed Education provided;Falls prevention discussed    FALL RISK PREVENTION PERTAINING TO THE HOME:  Any stairs in or around the home? Yes  If so, are there any without handrails? No  Home free of loose throw rugs in walkways, pet beds, electrical cords, etc? Yes  Adequate lighting in your home to reduce risk of falls? Yes   ASSISTIVE DEVICES UTILIZED TO PREVENT FALLS:  Life alert? No  Use of a cane, walker or w/c? No  Grab bars in the bathroom? No  Shower chair or bench in shower? No  Elevated toilet seat or a handicapped toilet? No   TIMED UP AND GO:  Was the test performed? Yes .  Length of time to ambulate 10 feet: 10 sec.   Gait steady and fast without use of assistive device  Cognitive Function:Normal cognitive status assessed by direct observation by this Nurse Health Advisor. No abnormalities found.       6CIT Screen 07/07/2020  What Year? 0 points  What month? 0 points  What time? 0 points  Count back from 20 0 points  Months in reverse 0 points  Repeat phrase 0 points  Total Score 0    Immunizations Immunization  History  Administered Date(s) Administered  . Fluad Quad(high Dose 65+) 12/31/2018, 01/16/2020  . Influenza Whole 03/21/2004, 12/19/2008  . Influenza, High Dose Seasonal PF 12/16/2015, 12/19/2016, 12/26/2017  . Influenza,inj,Quad PF,6+ Mos 01/14/2014, 12/15/2014  . Influenza,inj,quad, With Preservative 11/19/2016  . PFIZER(Purple Top)SARS-COV-2 Vaccination 04/25/2019, 05/23/2019, 03/06/2020  . Pneumococcal Conjugate-13 12/15/2014  . Pneumococcal Polysaccharide-23 03/21/2005  . Pneumococcal-Unspecified 11/19/2016  . Td 03/21/2005  . Tdap 12/16/2015  . Zoster 01/22/2008    TDAP status: Up to date  Flu Vaccine status: Up to date  Pneumococcal vaccine status: Up to date  Covid-19 vaccine status: Completed vaccines  Qualifies for Shingles Vaccine? Yes   Zostavax completed Yes   Shingrix Completed?: No.    Education has been provided regarding the importance of this vaccine. Patient has been advised to call insurance company to determine out of pocket expense if they have not yet received this vaccine. Advised may also receive vaccine at local pharmacy or Health Dept. Verbalized acceptance and understanding.  Screening Tests Health Maintenance  Topic Date Due  . COLONOSCOPY (Pts 45-44yrs Insurance coverage will need to be confirmed)  01/01/2018  . INFLUENZA VACCINE  10/19/2020  . TETANUS/TDAP  12/15/2025  . COVID-19 Vaccine  Completed  . PNA vac Low Risk Adult  Completed  . HPV VACCINES  Aged Out    Health Maintenance  Health Maintenance Due  Topic Date Due  . COLONOSCOPY (Pts 45-45yrs Insurance coverage will need to be confirmed)  01/01/2018    Colorectal cancer screening: No longer required.   Lung Cancer Screening: (Low Dose CT Chest recommended if Age 37-80 years, 30 pack-year currently smoking OR have quit w/in 15years.) does not qualify.    Additional Screening:  Hepatitis C Screening: does not qualify  Vision Screening: Recommended annual  ophthalmology exams  for early detection of glaucoma and other disorders of the eye. Is the patient up to date with their annual eye exam?  No  Who is the provider or what is the name of the office in which the patient attends annual eye exams? Dr. Sharen Counter Patient has an upcoming appt  Dental Screening: Recommended annual dental exams for proper oral hygiene  Community Resource Referral / Chronic Care Management: CRR required this visit?  No   CCM required this visit?  No      Plan:     I have personally reviewed and noted the following in the patient's chart:   . Medical and social history . Use of alcohol, tobacco or illicit drugs  . Current medications and supplements . Functional ability and status . Nutritional status . Physical activity . Advanced directives . List of other physicians . Hospitalizations, surgeries, and ER visits in previous 12 months . Vitals . Screenings to include cognitive, depression, and falls . Referrals and appointments  In addition, I have reviewed and discussed with patient certain preventive protocols, quality metrics, and best practice recommendations. A written personalized care plan for preventive services as well as general preventive health recommendations were provided to patient.    Marta Antu, LPN   [id number]  Nurse Health Advisor  Nurse Notes: None

## 2020-07-07 ENCOUNTER — Ambulatory Visit (INDEPENDENT_AMBULATORY_CARE_PROVIDER_SITE_OTHER): Payer: Medicare Other

## 2020-07-07 ENCOUNTER — Other Ambulatory Visit: Payer: Self-pay

## 2020-07-07 VITALS — BP 118/70 | HR 77 | Temp 97.7°F | Resp 16 | Ht 68.0 in | Wt 212.2 lb

## 2020-07-07 DIAGNOSIS — Z0000 Encounter for general adult medical examination without abnormal findings: Secondary | ICD-10-CM | POA: Diagnosis not present

## 2020-07-07 NOTE — Patient Instructions (Signed)
[name] , Thank you for taking time to come for your Medicare Wellness Visit. I appreciate your ongoing commitment to your health goals. Please review the following plan we discussed and let me know if I can assist you in the future.   Screening recommendations/referrals: Colonoscopy: No longer required Recommended yearly ophthalmology/optometry visit for glaucoma screening and checkup Recommended yearly dental visit for hygiene and checkup  Vaccinations: Influenza vaccine: Up to date Pneumococcal vaccine: Completed vaccines Tdap vaccine: Up to date-Due-12/15/2025 Shingles vaccine: Discuss with pharmacy  Covid-19: Completed vaccines  Advanced directives: Information given today  Conditions/risks identified: See problem list  Next appointment: Follow up in one year for your annual wellness visit. 07/13/2021 @ 10:30  Preventive Care 65 Years and Older, Male Preventive care refers to lifestyle choices and visits with your health care provider that can promote health and wellness. What does preventive care include?  A yearly physical exam. This is also called an annual well check.  Dental exams once or twice a year.  Routine eye exams. Ask your health care provider how often you should have your eyes checked.  Personal lifestyle choices, including:  Daily care of your teeth and gums.  Regular physical activity.  Eating a healthy diet.  Avoiding tobacco and drug use.  Limiting alcohol use.  Practicing safe sex.  Taking low doses of aspirin every day.  Taking vitamin and mineral supplements as recommended by your health care provider. What happens during an annual well check? The services and screenings done by your health care provider during your annual well check will depend on your age, overall health, lifestyle risk factors, and family history of disease. Counseling  Your health care provider may ask you questions about your:  Alcohol use.  Tobacco use.  Drug  use.  Emotional well-being.  Home and relationship well-being.  Sexual activity.  Eating habits.  History of falls.  Memory and ability to understand (cognition).  Work and work Statistician. Screening  You may have the following tests or measurements:  Height, weight, and BMI.  Blood pressure.  Lipid and cholesterol levels. These may be checked every 5 years, or more frequently if you are over 62 years old.  Skin check.  Lung cancer screening. You may have this screening every year starting at age 34 if you have a 30-pack-year history of smoking and currently smoke or have quit within the past 15 years.  Fecal occult blood test (FOBT) of the stool. You may have this test every year starting at age 20.  Flexible sigmoidoscopy or colonoscopy. You may have a sigmoidoscopy every 5 years or a colonoscopy every 10 years starting at age 53.  Prostate cancer screening. Recommendations will vary depending on your family history and other risks.  Hepatitis C blood test.  Hepatitis B blood test.  Sexually transmitted disease (STD) testing.  Diabetes screening. This is done by checking your blood sugar (glucose) after you have not eaten for a while (fasting). You may have this done every 1-3 years.  Abdominal aortic aneurysm (AAA) screening. You may need this if you are a current or former smoker.  Osteoporosis. You may be screened starting at age 52 if you are at high risk. Talk with your health care provider about your test results, treatment options, and if necessary, the need for more tests. Vaccines  Your health care provider may recommend certain vaccines, such as:  Influenza vaccine. This is recommended every year.  Tetanus, diphtheria, and acellular pertussis (Tdap, Td) vaccine.  You may need a Td booster every 10 years.  Zoster vaccine. You may need this after age 29.  Pneumococcal 13-valent conjugate (PCV13) vaccine. One dose is recommended after age  37.  Pneumococcal polysaccharide (PPSV23) vaccine. One dose is recommended after age 102. Talk to your health care provider about which screenings and vaccines you need and how often you need them. This information is not intended to replace advice given to you by your health care provider. Make sure you discuss any questions you have with your health care provider. Document Released: 04/03/2015 Document Revised: 11/25/2015 Document Reviewed: 01/06/2015 Elsevier Interactive Patient Education  2017 [place] Prevention in the Home Falls can cause injuries. They can happen to people of all ages. There are many things you can do to make your home safe and to help prevent falls. What can I do on the outside of my home?  Regularly fix the edges of walkways and driveways and fix any cracks.  Remove anything that might make you trip as you walk through a door, such as a raised step or threshold.  Trim any bushes or trees on the path to your home.  Use bright outdoor lighting.  Clear any walking paths of anything that might make someone trip, such as rocks or tools.  Regularly check to see if handrails are loose or broken. Make sure that both sides of any steps have handrails.  Any raised decks and porches should have guardrails on the edges.  Have any leaves, snow, or ice cleared regularly.  Use sand or salt on walking paths during winter.  Clean up any spills in your garage right away. This includes oil or grease spills. What can I do in the bathroom?  Use night lights.  Install grab bars by the toilet and in the tub and shower. Do not use towel bars as grab bars.  Use non-skid mats or decals in the tub or shower.  If you need to sit down in the shower, use a plastic, non-slip stool.  Keep the floor dry. Clean up any water that spills on the floor as soon as it happens.  Remove soap buildup in the tub or shower regularly.  Attach bath mats securely with double-sided  non-slip rug tape.  Do not have throw rugs and other things on the floor that can make you trip. What can I do in the bedroom?  Use night lights.  Make sure that you have a light by your bed that is easy to reach.  Do not use any sheets or blankets that are too big for your bed. They should not hang down onto the floor.  Have a firm chair that has side arms. You can use this for support while you get dressed.  Do not have throw rugs and other things on the floor that can make you trip. What can I do in the kitchen?  Clean up any spills right away.  Avoid walking on wet floors.  Keep items that you use a lot in easy-to-reach places.  If you need to reach something above you, use a strong step stool that has a grab bar.  Keep electrical cords out of the way.  Do not use floor polish or wax that makes floors slippery. If you must use wax, use non-skid floor wax.  Do not have throw rugs and other things on the floor that can make you trip. What can I do with my stairs?  Do not leave any  items on the stairs.  Make sure that there are handrails on both sides of the stairs and use them. Fix handrails that are broken or loose. Make sure that handrails are as long as the stairways.  Check any carpeting to make sure that it is firmly attached to the stairs. Fix any carpet that is loose or worn.  Avoid having throw rugs at the top or bottom of the stairs. If you do have throw rugs, attach them to the floor with carpet tape.  Make sure that you have a light switch at the top of the stairs and the bottom of the stairs. If you do not have them, ask someone to add them for you. What else can I do to help prevent falls?  Wear shoes that:  Do not have high heels.  Have rubber bottoms.  Are comfortable and fit you well.  Are closed at the toe. Do not wear sandals.  If you use a stepladder:  Make sure that it is fully opened. Do not climb a closed stepladder.  Make sure that both  sides of the stepladder are locked into place.  Ask someone to hold it for you, if possible.  Clearly mark and make sure that you can see:  Any grab bars or handrails.  First and last steps.  Where the edge of each step is.  Use tools that help you move around (mobility aids) if they are needed. These include:  Canes.  Walkers.  Scooters.  Crutches.  Turn on the lights when you go into a dark area. Replace any light bulbs as soon as they burn out.  Set up your furniture so you have a clear path. Avoid moving your furniture around.  If any of your floors are uneven, fix them.  If there are any pets around you, be aware of where they are.  Review your medicines with your doctor. Some medicines can make you feel dizzy. This can increase your chance of falling. Ask your doctor what other things that you can do to help prevent falls. This information is not intended to replace advice given to you by your health care provider. Make sure you discuss any questions you have with your health care provider. Document Released: 01/01/2009 Document Revised: 08/13/2015 Document Reviewed: 04/11/2014 Elsevier Interactive Patient Education  2017 [employer].

## 2020-08-06 ENCOUNTER — Other Ambulatory Visit: Payer: Self-pay | Admitting: Family Medicine

## 2020-08-06 DIAGNOSIS — E039 Hypothyroidism, unspecified: Secondary | ICD-10-CM

## 2020-08-26 ENCOUNTER — Ambulatory Visit (INDEPENDENT_AMBULATORY_CARE_PROVIDER_SITE_OTHER): Payer: Medicare Other | Admitting: Family Medicine

## 2020-08-26 ENCOUNTER — Other Ambulatory Visit: Payer: Self-pay

## 2020-08-26 ENCOUNTER — Encounter: Payer: Self-pay | Admitting: Family Medicine

## 2020-08-26 VITALS — BP 132/74 | HR 67 | Temp 97.7°F | Ht 68.0 in | Wt 213.8 lb

## 2020-08-26 DIAGNOSIS — I998 Other disorder of circulatory system: Secondary | ICD-10-CM | POA: Diagnosis not present

## 2020-08-26 DIAGNOSIS — L720 Epidermal cyst: Secondary | ICD-10-CM

## 2020-08-26 DIAGNOSIS — I10 Essential (primary) hypertension: Secondary | ICD-10-CM | POA: Diagnosis not present

## 2020-08-26 LAB — BASIC METABOLIC PANEL
BUN: 20 mg/dL (ref 6–23)
CO2: 31 mEq/L (ref 19–32)
Calcium: 9.3 mg/dL (ref 8.4–10.5)
Chloride: 102 mEq/L (ref 96–112)
Creatinine, Ser: 0.93 mg/dL (ref 0.40–1.50)
GFR: 77.49 mL/min (ref >60.00)
Glucose, Bld: 96 mg/dL (ref 70–99)
Potassium: 4.3 mEq/L (ref 3.5–5.1)
Sodium: 141 mEq/L (ref 135–145)

## 2020-08-26 MED ORDER — HYDROCHLOROTHIAZIDE 25 MG PO TABS: 25.0000 mg | ORAL_TABLET | Freq: Every day | ORAL | 3 refills | Status: DC

## 2020-08-26 MED ORDER — DOXYCYCLINE HYCLATE 100 MG PO TABS: 100.0000 mg | ORAL_TABLET | Freq: Two times a day (BID) | ORAL | 0 refills | Status: AC

## 2020-08-26 NOTE — Progress Notes (Signed)
Established Patient Office Visit  Subjective:  Patient ID: Travis Schwartz, male    DOB: 03-02-1941  Age: 80 y.o. MRN: [id number]  CC:  Chief Complaint  Patient presents with  . Follow-up    3 month follow up on BP, no concerns. Patient fasting for labs.     HPI Travis Schwartz presents for follow-up of his hypertension and nontender swelling around his left eye.  Is been spontaneously draining.  He has applied cold compresses.  Denies fevers chills or headaches.  Blood pressure is well controlled on the higher dose of HCTZ.  He is having no issues taking it.  Past Medical History:  Diagnosis Date  . Allergy    no per pt  . Arthritis   . Cancer [facility])    prostate  . Diverticulosis    2008  . GERD (gastroesophageal reflux disease)   . Hiatal hernia   . Hyperlipidemia   . Prostate cancer ([place]) 1992    Past Surgical History:  Procedure Laterality Date  . CATARACT EXTRACTION     both eyes  . CHOLECYSTECTOMY    . COLONOSCOPY    . TONSILLECTOMY    . TRANSURETHRAL RESECTION OF PROSTATE    . UPPER GASTROINTESTINAL ENDOSCOPY      Family History  Problem Relation Age of Onset  . Cancer Sister        uterine  . Colon cancer Mother   . Hyperlipidemia Other   . COPD Other        colon  . Esophageal cancer Neg Hx   . Rectal cancer Neg Hx   . Stomach cancer Neg Hx     Social History   Socioeconomic History  . Marital status: Married    Spouse name: Not on file  . Number of children: Not on file  . Years of education: Not on file  . Highest education level: Not on file  Occupational History  . Not on file  Tobacco Use  . Smoking status: Former Smoker    Packs/day: 2.00    Years: 3.00    Pack years: 6.00    Types: Cigarettes    Quit date: 03/22/1963    Years since quitting: 57.4  . Smokeless tobacco: Former Network engineer and Sexual Activity  . Alcohol use: Yes    Alcohol/week: 0.0 standard drinks    Comment: rarely  . Drug use: No  . Sexual activity: Not  on file  Other Topics Concern  . Not on file  Social History Narrative   Lives with wife, son, and granddaughter in a 2 story home.  1 child.     Previously worked as a Administrator for 30 years.     High school education.   Social Determinants of Health   Financial Resource Strain: Low Risk   . Difficulty of Paying Living Expenses: Not hard at all  Food Insecurity: No Food Insecurity  . Worried About Charity fundraiser in the Last Year: Never true  . Ran Out of Food in the Last Year: Never true  Transportation Needs: No Transportation Needs  . Lack of Transportation (Medical): No  . Lack of Transportation (Non-Medical): No  Physical Activity: Insufficiently Active  . Days of Exercise per Week: 2 days  . Minutes of Exercise per Session: 50 min  Stress: No Stress Concern Present  . Feeling of Stress : Not at all  Social Connections: Moderately Isolated  . Frequency of Communication with Friends and Family:  More than three times a week  . Frequency of Social Gatherings with Friends and Family: More than three times a week  . Attends Religious Services: Never  . Active Member of Clubs or Organizations: No  . Attends Archivist Meetings: Never  . Marital Status: Married  Human resources officer Violence: Not At Risk  . Fear of Current or Ex-Partner: No  . Emotionally Abused: No  . Physically Abused: No  . Sexually Abused: No    Outpatient Medications Prior to Visit  Medication Sig Dispense Refill  . atorvastatin (LIPITOR) 40 MG tablet TAKE 1 TABLET BY MOUTH EVERY DAY 90 tablet 0  . levothyroxine (SYNTHROID) 75 MCG tablet TAKE 1 TABLET(75 MCG) BY MOUTH DAILY BEFORE AND BREAKFAST 90 tablet 0  . omeprazole (PRILOSEC) 20 MG capsule TAKE 1 CAPSULE BY MOUTH DAILY 90 capsule 0  . hydrochlorothiazide (HYDRODIURIL) 25 MG tablet Take 1 tablet (25 mg total) by mouth daily. 90 tablet 3   No facility-administered medications prior to visit.    No Known Allergies  ROS Review of  Systems  Constitutional: Negative.   HENT: Negative.   Eyes: Negative for photophobia and visual disturbance.  Respiratory: Negative.   Cardiovascular: Negative.   Gastrointestinal: Negative.   Endocrine: Negative for polyphagia and polyuria.  Genitourinary: Negative.   Musculoskeletal: Negative for gait problem and joint swelling.  Skin: Positive for color change. Negative for rash.  Neurological: Negative for speech difficulty and weakness.  Psychiatric/Behavioral: Negative.       Objective:    Physical Exam Vitals and nursing note reviewed.  Constitutional:      General: He is not in acute distress.    Appearance: Normal appearance. He is not ill-appearing, toxic-appearing or diaphoretic.  HENT:     Head: Normocephalic and atraumatic.     Right Ear: Tympanic membrane, ear canal and external ear normal.     Left Ear: Tympanic membrane, ear canal and external ear normal.     Mouth/Throat:     Mouth: Mucous membranes are dry.     Pharynx: Oropharynx is clear. No oropharyngeal exudate or posterior oropharyngeal erythema.  Eyes:     General: No scleral icterus.       Right eye: No discharge.        Left eye: No discharge.     Extraocular Movements: Extraocular movements intact.     Conjunctiva/sclera: Conjunctivae normal.     Pupils: Pupils are equal, round, and reactive to light.  Neck:     Vascular: No carotid bruit.  Cardiovascular:     Rate and Rhythm: Normal rate and regular rhythm.  Pulmonary:     Effort: Pulmonary effort is normal.     Breath sounds: Normal breath sounds.  Musculoskeletal:     Cervical back: No rigidity or tenderness.  Lymphadenopathy:     Cervical: No cervical adenopathy.  Skin:    General: Skin is warm and dry.       Neurological:     Mental Status: He is alert and oriented to person, place, and time.  Psychiatric:        Mood and Affect: Mood normal.        Behavior: Behavior normal.     BP 132/74   Pulse 67   Temp 97.7 F (36.5  C) (Temporal)   Ht 5\' 8"  (1.727 m)   Wt 213 lb 12.8 oz (97 kg)   SpO2 97%   BMI 32.51 kg/m  Wt Readings from Last 3 Encounters:  08/26/20 213 lb 12.8 oz (97 kg)  07/07/20 212 lb 3.2 oz (96.3 kg)  05/25/20 217 lb (98.4 kg)     Health Maintenance Due  Topic Date Due  . Zoster Vaccines- Shingrix (1 of 2) Never done  . COLONOSCOPY (Pts 45-76yrs Insurance coverage will need to be confirmed)  01/01/2018    There are no preventive care reminders to display for this patient.  Lab Results  Component Value Date   TSH 3.21 05/25/2020   Lab Results  Component Value Date   WBC 5.3 07/30/2019   HGB 14.5 07/30/2019   HCT 41.9 07/30/2019   MCV 96.0 07/30/2019   PLT 346.0 07/30/2019   Lab Results  Component Value Date   NA 139 05/25/2020   K 3.8 05/25/2020   CO2 27 05/25/2020   GLUCOSE 135 (H) 05/25/2020   BUN 20 05/25/2020   CREATININE 0.91 05/25/2020   BILITOT 1.3 (H) 12/31/2018   ALKPHOS 75 12/31/2018   AST 21 12/31/2018   ALT 29 12/31/2018   PROT 6.4 12/31/2018   ALBUMIN 4.6 12/31/2018   CALCIUM 9.2 05/25/2020   GFR 79.68 05/25/2020   Lab Results  Component Value Date   CHOL 143 01/16/2020   Lab Results  Component Value Date   HDL 38.60 (L) 01/16/2020   Lab Results  Component Value Date   LDLCALC 75 01/16/2020   Lab Results  Component Value Date   TRIG 146.0 01/16/2020   Lab Results  Component Value Date   CHOLHDL 4 01/16/2020   Lab Results  Component Value Date   HGBA1C 5.4 10/30/2019      Assessment & Plan:   Problem List Items Addressed This Visit      Cardiovascular and Mediastinum   Essential hypertension - Primary   Relevant Medications   hydrochlorothiazide (HYDRODIURIL) 25 MG tablet   Other Relevant Orders   Basic metabolic panel     Other   Poorly controlled blood pressure   Relevant Medications   hydrochlorothiazide (HYDRODIURIL) 25 MG tablet   Epidermal inclusion cyst   Relevant Medications   doxycycline (VIBRA-TABS) 100 MG  tablet   Other Relevant Orders   Wound culture      Meds ordered this encounter  Medications  . hydrochlorothiazide (HYDRODIURIL) 25 MG tablet    Sig: Take 1 tablet (25 mg total) by mouth daily.    Dispense:  90 tablet    Refill:  3  . doxycycline (VIBRA-TABS) 100 MG tablet    Sig: Take 1 tablet (100 mg total) by mouth 2 (two) times daily for 10 days.    Dispense:  20 tablet    Refill:  0    Follow-up: Return in about 3 months (around 11/26/2020), or if symptoms worsen or fail to improve, for and if inclusion cyst doesn't resolve with warm compresses and doxy. .  Patient advised to apply warm compresses 3 times daily.  He will start doxycycline.  Avoid sun exposure.  He will follow-up if the cyst does not resolve on its own.  Doing well with the low-dose of HCTZ.  Blood pressure controlled.  Advised increasing potassium in his diet with bananas and green leafy vegetables.  Libby Maw, MD

## 2020-08-26 NOTE — Patient Instructions (Addendum)
Epidermoid Cyst  An epidermoid cyst, also known as epidermal cyst, is a sac made of skin tissue. The sac contains a substance called keratin. Keratin is a protein that is normally secreted through the hair follicles. When keratin becomes trapped in the top layer of skin (epidermis), it can form an epidermoid cyst. Epidermoid cysts can be found anywhere on your body. These cysts are usually harmless (benign), and they may not cause symptoms unless they become inflamed or infected. What are the causes? This condition may be caused by:  A blocked hair follicle.  A hair that curls and re-enters the skin instead of growing straight out of the skin (ingrown hair).  A blocked pore.  Irritated skin.  An injury to the skin.  Certain conditions that are passed along from parent to child (inherited).  Human papillomavirus (HPV). This happens rarely when cysts occur on the bottom of the feet.  Long-term (chronic) sun damage to the skin. What increases the risk? The following factors may make you more likely to develop an epidermoid cyst:  Having acne.  Being male.  Having an injury to the skin.  Being past puberty.  Having certain rare genetic disorders. What are the signs or symptoms? The only symptom of this condition may be a small, painless lump underneath the skin. When an epidermal cyst ruptures, it may become inflamed. True infection in cysts is rare. Symptoms may include:  Redness.  Inflammation.  Tenderness.  Warmth.  Keratin draining from the cyst. Keratin is grayish-white, bad-smelling substance.  Pus draining from the cyst. How is this diagnosed? This condition is diagnosed with a physical exam.  In some cases, you may have a sample of tissue (biopsy) taken from your cyst to be examined under a microscope or tested for bacteria.  You may be referred to a health care provider who specializes in skin care (dermatologist). How is this treated? If a cyst becomes  inflamed, treatment may include:  Opening and draining the cyst, done by a health care provider. After draining, minor surgery to remove the rest of the cyst may be done.  Taking antibiotic medicine.  Having injections of medicines (steroids) that help to reduce inflammation.  Having surgery to remove the cyst. Surgery may be done if the cyst: ? Becomes large. ? Bothers you. ? Has a chance of turning into cancer.  Do not try to open a cyst yourself. Follow these instructions at home: Medicines  If you were prescribed an antibiotic medicine, take it it as told by your health care provider. Do not stop using the antibiotic even if you start to feel better.  Take over-the-counter and prescription medicines only as told by your health care provider. General instructions  Keep the area around your cyst clean and dry.  Wear loose, dry clothing.  Avoid touching your cyst.  Check your cyst every day for signs of infection. Check for: ? Redness, swelling, or pain. ? Fluid or blood. ? Warmth. ? Pus or a bad smell.  Keep all follow-up visits. This is important. How is this prevented?  Wear clean, dry, clothing.  Avoid wearing tight clothing.  Keep your skin clean and dry. Take showers or baths every day. Contact a health care provider if:  Your cyst develops symptoms of infection.  Your condition is not improving or is getting worse.  You develop a cyst that looks different from other cysts you have had.  You have a fever. Get help right away if:  Redness spreads  from the cyst into the surrounding area. Summary  An epidermoid cyst is a sac made of skin tissue. These cysts are usually harmless (benign), and they may not cause symptoms unless they become inflamed.  If a cyst becomes inflamed, treatment may include surgery to open and drain the cyst, or to remove it. Treatment may also include medicines by mouth or through an injection.  Take over-the-counter and  prescription medicines only as told by your health care provider. If you were prescribed an antibiotic medicine, take it as told by your health care provider. Do not stop using the antibiotic even if you start to feel better.  Contact a health care provider if your condition is not improving or is getting worse.  Keep all follow-up visits as told by your health care provider. This is important. This information is not intended to replace advice given to you by your health care provider. Make sure you discuss any questions you have with your health care provider. Document Revised: 06/12/2019 Document Reviewed: 06/12/2019 Elsevier Patient Education  2021 [place].  Preventing Hypertension Hypertension, also called high blood pressure, is when the force of blood pumping through the arteries is too strong. Arteries are blood vessels that carry blood from the heart throughout the body. Often, hypertension does not cause symptoms until blood pressure is very high. It is important to have your blood pressure checked regularly. Diet and lifestyle changes can help you prevent hypertension, and they may make you feel better overall and improve your quality of life. If you already have hypertension, you may control it with diet and lifestyle changes, as well as with medicine. How can this condition affect me? Over time, hypertension can damage the arteries and decrease blood flow to important parts of the body, including the brain, heart, and kidneys. By keeping your blood pressure in a healthy range, you can help prevent complications like heart attack, heart failure, stroke, kidney failure, and vascular dementia. What can increase my risk?  Being an older adult. Older people are more often affected.  Having family members who have had high blood pressure.  Being obese.  Being male. Males are more likely to have high blood pressure.  Drinking too much alcohol or caffeine.  Smoking or using  illegal drugs.  Taking certain medicines, such as antidepressants, decongestants, birth control pills, and NSAIDs, such as ibuprofen.  Having thyroid problems.  Having certain tumors. What actions can I take to prevent or manage this condition? Work with your health care provider to make a hypertension prevention plan that works for you. Follow your plan and keep all follow-up visits as told by your health care provider. Diet changes Maintain a healthy diet. This includes:  Eating less salt (sodium). Ask your health care provider how much sodium is safe for you to have. The general recommendation is to have less than 1 tsp (2,300 mg) of sodium a day. ? Do not add salt to your food. ? Choose low-sodium options when grocery shopping and eating out.  Limiting fats in your diet. You can do this by eating low-fat or fat-free dairy products and by eating less red meat.  Eating more fruits, vegetables, and whole grains. Make a goal to eat: ? 1-2 cups of fresh fruits and vegetables each day. ? 3-4 servings of whole grains each day.  Avoiding foods and beverages that have added sugars.  Eating fish that contain healthy fats (omega-3 fatty acids), such as mackerel or salmon. If you need help  putting together a healthy eating plan, try the DASH diet. This diet is high in fruits, vegetables, and whole grains. It is low in sodium, red meat, and added sugars. DASH stands for Dietary Approaches to Stop Hypertension.   Lifestyle changes Lose weight if you are overweight. Losing just 3?5% of your body weight can help prevent or control hypertension. For example, if your present weight is 200 lb (91 kg), a loss of 3-5% of your weight means losing 6-10 lb (2.7-4.5 kg). Ask your health care provider to help you with a diet and exercise plan to safely lose weight. Other recommendations usually include:  Get enough exercise. Do at least 150 minutes of moderate-intensity exercise each week. You could do this  in short exercise sessions several times a day, or you could do longer exercise sessions a few times a week. For example, you could take a brisk 10-minute walk or bike ride, 3 times a day, for 5 days a week.  Find ways to reduce stress, such as exercising, meditating, listening to music, or taking a yoga class. If you need help reducing stress, ask your health care provider.  Do not use any products that contain nicotine or tobacco, such as cigarettes, e-cigarettes, and chewing tobacco. If you need help quitting, ask your health care provider. Chemicals in tobacco and nicotine products raise your blood pressure each time you use them. If you need help quitting, ask your health care provider.  Learn how to check your blood pressure at home. Make sure that you know your personal target blood pressure, as told by your health care provider.  Try to sleep 7-9 hours per night.   Alcohol use  Do not drink alcohol if: ? Your health care provider tells you not to drink. ? You are pregnant, may be pregnant, or are planning to become pregnant.  If you drink alcohol: ? Limit how much you use to:  0-1 drink a day for women.  0-2 drinks a day for men. ? Be aware of how much alcohol is in your drink. In the [place] one drink equals one 12 oz bottle of beer (355 mL), one 5 oz glass of wine (148 mL), or one 1 oz glass of hard liquor (44 mL). Medicines In addition to diet and lifestyle changes, your health care provider may recommend medicines to help lower your blood pressure. In general:  You may need to try a few different medicines to find what works best for you.  You may need to take more than one medicine.  Take over-the-counter and prescription medicines only as told by your health care provider. Questions to ask your health care provider  What is my blood pressure goal?  How can I lower my risk for high blood pressure?  How should I monitor my blood pressure at home? Where to find  support Your health care provider can help you prevent hypertension and help you keep your blood pressure at a healthy level. Your local hospital or your community may also provide support services and prevention programs. The American Heart Association offers an online support network at supportnetwork.heart.org Where to find more information Learn more about hypertension from:  [place], Lung, and [place]: https://wilson-eaton.com/  Centers for Disease Control and Prevention: http://www.wolf.info/  American Academy of Family Physicians: familydoctor.org Learn more about the DASH diet from:  [place], Lung, and [place]: https://wilson-eaton.com/ Contact a health care provider if:  You think you are having a reaction to medicines you  have taken.  You have recurrent headaches or feel dizzy.  You have swelling in your ankles.  You have trouble with your vision. Get help right away if:  You have sudden, severe chest, back, or abdominal pain or discomfort.  You have shortness of breath.  You have a sudden, severe headache. These symptoms may represent a serious problem that is an emergency. Do not wait to see if the symptoms will go away. Get medical help right away. Call your local emergency services (911 in the [place] Do not drive yourself to the hospital.  Summary  Hypertension often does not cause any symptoms until blood pressure is very high. It is important to get your blood pressure checked regularly.  Diet and lifestyle changes are important steps in preventing hypertension.  By keeping your blood pressure in a healthy range, you may prevent complications like heart attack, heart failure, stroke, and kidney failure.  Work with your health care provider to make a hypertension prevention plan that works for you. This information is not intended to replace advice given to you by your health care provider. Make sure you discuss any questions you have with your health care  provider. Document Revised: 02/05/2019 Document Reviewed: 02/05/2019 Elsevier Patient Education  2021 [employer].

## 2020-08-30 LAB — WOUND CULTURE
MICRO NUMBER:: 11983791
RESULT:: NO GROWTH
SPECIMEN QUALITY:: ADEQUATE

## 2020-09-09 ENCOUNTER — Other Ambulatory Visit: Payer: Self-pay | Admitting: Family

## 2020-09-09 DIAGNOSIS — E785 Hyperlipidemia, unspecified: Secondary | ICD-10-CM

## 2020-09-09 DIAGNOSIS — Z0000 Encounter for general adult medical examination without abnormal findings: Secondary | ICD-10-CM

## 2020-09-15 ENCOUNTER — Ambulatory Visit: Payer: Medicare Other | Attending: Internal Medicine

## 2020-09-15 DIAGNOSIS — Z23 Encounter for immunization: Secondary | ICD-10-CM

## 2020-09-15 NOTE — Progress Notes (Signed)
   Covid-19 Vaccination Clinic  Name:  Travis Schwartz    MRN: [id number] DOB: 1940/09/10  09/15/2020  [name] was observed post Covid-19 immunization for 15 minutes without incident. He was provided with Vaccine Information Sheet and instruction to access the V-Safe system.   [name] was instructed to call 911 with any severe reactions post vaccine: Difficulty breathing  Swelling of face and throat  A fast heartbeat  A bad rash all over body  Dizziness and weakness   Immunizations Administered     Name Date Dose VIS Date Route   PFIZER Comrnaty(Gray TOP) Covid-19 Vaccine 09/15/2020  8:58 AM 0.3 mL 02/27/2020 Intramuscular   Manufacturer: [place]   Lot: [id number]   [place]: [phone]

## 2020-09-18 ENCOUNTER — Other Ambulatory Visit (HOSPITAL_BASED_OUTPATIENT_CLINIC_OR_DEPARTMENT_OTHER): Payer: Self-pay

## 2020-09-18 ENCOUNTER — Other Ambulatory Visit: Payer: Self-pay | Admitting: Family Medicine

## 2020-09-18 DIAGNOSIS — Z0000 Encounter for general adult medical examination without abnormal findings: Secondary | ICD-10-CM

## 2020-09-18 MED ORDER — COVID-19 MRNA VAC-TRIS(PFIZER) 30 MCG/0.3ML IM SUSP
INTRAMUSCULAR | 0 refills | Status: DC
Start: 2020-09-15 — End: 2020-12-24
  Filled 2020-09-18: qty 0.3, 1d supply, fill #0

## 2020-09-27 ENCOUNTER — Other Ambulatory Visit: Payer: Self-pay | Admitting: Family

## 2020-09-27 DIAGNOSIS — E785 Hyperlipidemia, unspecified: Secondary | ICD-10-CM

## 2020-10-02 ENCOUNTER — Telehealth: Payer: Self-pay | Admitting: Family Medicine

## 2020-10-02 ENCOUNTER — Other Ambulatory Visit: Payer: Self-pay

## 2020-10-02 DIAGNOSIS — E785 Hyperlipidemia, unspecified: Secondary | ICD-10-CM

## 2020-10-02 MED ORDER — ATORVASTATIN CALCIUM 40 MG PO TABS: 40.0000 mg | ORAL_TABLET | Freq: Every day | ORAL | 0 refills | Status: DC

## 2020-10-02 NOTE — Telephone Encounter (Signed)
What is the name of the medication? atorvastatin (LIPITOR) 40 MG tablet [[id number]]   Have you contacted your pharmacy to request a refill? Yes, they have sent a request over. He has no pills left.  Which pharmacy would you like this sent to? [id number] - [place], [place] - [address] AT [facility] [address]  [address], [place] [address]  Phone:  [phone]  Fax:  [phone]  DEA #:  [id number]     Patient notified that their request is being sent to the clinical staff for review and that they should receive a call once it is complete. If they do not receive a call within 72 hours they can check with their pharmacy or our office.

## 2020-10-02 NOTE — Telephone Encounter (Signed)
Done

## 2020-11-01 ENCOUNTER — Other Ambulatory Visit: Payer: Self-pay | Admitting: Family Medicine

## 2020-11-01 DIAGNOSIS — E039 Hypothyroidism, unspecified: Secondary | ICD-10-CM

## 2020-11-30 ENCOUNTER — Telehealth: Payer: Self-pay | Admitting: Family Medicine

## 2020-11-30 NOTE — Chronic Care Management (AMB) (Signed)
  Chronic Care Management   Note  11/30/2020 Name: Travis Schwartz MRN: [id number] DOB: 1940/04/07  Travis Schwartz is a 80 y.o. year old male who is a primary care patient of Libby Maw, MD. I reached out to Clarice Pole by phone today in response to a referral sent by Travis Schwartz's PCP, Libby Maw, MD.   Travis Schwartz was given information about Chronic Care Management services today including:  CCM service includes personalized support from designated clinical staff supervised by his physician, including individualized plan of care and coordination with other care providers 24/7 contact phone numbers for assistance for urgent and routine care needs. Service will only be billed when office clinical staff spend 20 minutes or more in a month to coordinate care. Only one practitioner may furnish and bill the service in a calendar month. The patient may stop CCM services at any time (effective at the end of the month) by phone call to the office staff.   Patient agreed to services and verbal consent obtained.   Follow up plan:   Tatjana Secretary/administrator

## 2020-12-01 ENCOUNTER — Ambulatory Visit (INDEPENDENT_AMBULATORY_CARE_PROVIDER_SITE_OTHER): Payer: Medicare Other | Admitting: Family Medicine

## 2020-12-01 ENCOUNTER — Encounter: Payer: Self-pay | Admitting: Family Medicine

## 2020-12-01 ENCOUNTER — Other Ambulatory Visit: Payer: Self-pay

## 2020-12-01 VITALS — BP 110/66 | HR 73 | Temp 97.8°F | Ht 68.0 in | Wt 211.2 lb

## 2020-12-01 DIAGNOSIS — E039 Hypothyroidism, unspecified: Secondary | ICD-10-CM

## 2020-12-01 DIAGNOSIS — R7309 Other abnormal glucose: Secondary | ICD-10-CM | POA: Diagnosis not present

## 2020-12-01 DIAGNOSIS — Z23 Encounter for immunization: Secondary | ICD-10-CM

## 2020-12-01 DIAGNOSIS — I10 Essential (primary) hypertension: Secondary | ICD-10-CM | POA: Diagnosis not present

## 2020-12-01 LAB — BASIC METABOLIC PANEL
BUN: 29 mg/dL — ABNORMAL HIGH (ref 6–23)
CO2: 27 mEq/L (ref 19–32)
Calcium: 9.1 mg/dL (ref 8.4–10.5)
Chloride: 104 mEq/L (ref 96–112)
Creatinine, Ser: 0.89 mg/dL (ref 0.40–1.50)
GFR: 80.72 mL/min (ref >60.00)
Glucose, Bld: 90 mg/dL (ref 70–99)
Potassium: 3.9 mEq/L (ref 3.5–5.1)
Sodium: 142 mEq/L (ref 135–145)

## 2020-12-01 LAB — URINALYSIS, ROUTINE W REFLEX MICROSCOPIC
Bilirubin Urine: NEGATIVE
Hgb urine dipstick: NEGATIVE
Ketones, ur: NEGATIVE
Leukocytes,Ua: NEGATIVE
Nitrite: NEGATIVE
Specific Gravity, Urine: 1.02 (ref 1.000–1.030)
Total Protein, Urine: NEGATIVE
Urine Glucose: NEGATIVE
Urobilinogen, UA: 0.2 (ref 0.0–1.0)
pH: 6 (ref 5.0–8.0)

## 2020-12-01 LAB — HEMOGLOBIN A1C: Hgb A1c MFr Bld: 5.5 % (ref 4.6–6.5)

## 2020-12-01 MED ORDER — HYDROCHLOROTHIAZIDE 25 MG PO TABS: 25.0000 mg | ORAL_TABLET | Freq: Every day | ORAL | 3 refills | Status: DC

## 2020-12-01 MED ORDER — LEVOTHYROXINE SODIUM 75 MCG PO TABS: ORAL_TABLET | ORAL | 0 refills | Status: DC

## 2020-12-01 NOTE — Progress Notes (Signed)
Established Patient Office Visit  Subjective:  Patient ID: Travis Schwartz, male    DOB: 12/09/1940  Age: 80 y.o. MRN: [id number]  CC:  Chief Complaint  Patient presents with   Follow-up    HPI Travis Schwartz presents for follow-up of hypertension that is under much better control with HCTZ.  He is tolerating the drug well and declines frequent urination at this point.  He is not taking a potassium supplement.  Continues levothyroxine and atorvastatin well-controlled hypothyroidism hypercholesterolemia.  He is currently active working out his yard.  Past Medical History:  Diagnosis Date   Allergy    no per pt   Arthritis    Cancer [facility])    prostate   Diverticulosis    2008   GERD (gastroesophageal reflux disease)    Hiatal hernia    Hyperlipidemia    Prostate cancer ([place]) 1992    Past Surgical History:  Procedure Laterality Date   CATARACT EXTRACTION     both eyes   CHOLECYSTECTOMY     COLONOSCOPY     TONSILLECTOMY     TRANSURETHRAL RESECTION OF PROSTATE     UPPER GASTROINTESTINAL ENDOSCOPY      Family History  Problem Relation Age of Onset   Cancer Sister        uterine   Colon cancer Mother    Hyperlipidemia Other    COPD Other        colon   Esophageal cancer Neg Hx    Rectal cancer Neg Hx    Stomach cancer Neg Hx     Social History   Socioeconomic History   Marital status: Married    Spouse name: Not on file   Number of children: Not on file   Years of education: Not on file   Highest education level: Not on file  Occupational History   Not on file  Tobacco Use   Smoking status: Former    Packs/day: 2.00    Years: 3.00    Pack years: 6.00    Types: Cigarettes    Quit date: 03/22/1963    Years since quitting: 57.7   Smokeless tobacco: Former  Substance and Sexual Activity   Alcohol use: Yes    Alcohol/week: 0.0 standard drinks    Comment: rarely   Drug use: No   Sexual activity: Not on file  Other Topics Concern   Not on file  Social  History Narrative   Lives with wife, son, and granddaughter in a 2 story home.  1 child.     Previously worked as a Administrator for 30 years.     High school education.   Social Determinants of Health   Financial Resource Strain: Low Risk    Difficulty of Paying Living Expenses: Not hard at all  Food Insecurity: No Food Insecurity   Worried About Charity fundraiser in the Last Year: Never true   [place] in the Last Year: Never true  Transportation Needs: No Transportation Needs   Lack of Transportation (Medical): No   Lack of Transportation (Non-Medical): No  Physical Activity: Insufficiently Active   Days of Exercise per Week: 2 days   Minutes of Exercise per Session: 50 min  Stress: No Stress Concern Present   Feeling of Stress : Not at all  Social Connections: Moderately Isolated   Frequency of Communication with Friends and Family: More than three times a week   Frequency of Social Gatherings with Friends and Family:  More than three times a week   Attends Religious Services: Never   Active Member of Clubs or Organizations: No   Attends Archivist Meetings: Never   Marital Status: Married  Human resources officer Violence: Not At Risk   Fear of Current or Ex-Partner: No   Emotionally Abused: No   Physically Abused: No   Sexually Abused: No    Outpatient Medications Prior to Visit  Medication Sig Dispense Refill   atorvastatin (LIPITOR) 40 MG tablet Take 1 tablet (40 mg total) by mouth daily. 90 tablet 0   COVID-19 mRNA Vac-TriS, Pfizer, SUSP injection Inject into the muscle. 0.3 mL 0   omeprazole (PRILOSEC) 20 MG capsule TAKE 1 CAPSULE BY MOUTH DAILY 90 capsule 0   hydrochlorothiazide (HYDRODIURIL) 25 MG tablet Take 1 tablet (25 mg total) by mouth daily. 90 tablet 3   levothyroxine (SYNTHROID) 75 MCG tablet TAKE 1 TABLET(75 MCG) BY MOUTH DAILY BEFORE AND BREAKFAST 90 tablet 0   No facility-administered medications prior to visit.    No Known  Allergies  ROS Review of Systems  Constitutional: Negative.   Respiratory: Negative.  Negative for chest tightness and shortness of breath.   Cardiovascular:  Negative for chest pain and palpitations.  Gastrointestinal: Negative.   Genitourinary:  Negative for difficulty urinating, dysuria, frequency and urgency.  Musculoskeletal:  Negative for gait problem and joint swelling.  Neurological:  Negative for speech difficulty and weakness.  Psychiatric/Behavioral: Negative.       Objective:    Physical Exam Vitals and nursing note reviewed.  Constitutional:      General: He is not in acute distress.    Appearance: Normal appearance. He is not ill-appearing, toxic-appearing or diaphoretic.  HENT:     Head: Normocephalic and atraumatic.     Right Ear: Tympanic membrane, ear canal and external ear normal.     Left Ear: Tympanic membrane, ear canal and external ear normal.     Mouth/Throat:     Mouth: Mucous membranes are moist.     Pharynx: Oropharynx is clear. No posterior oropharyngeal erythema.  Eyes:     General: No scleral icterus.       Right eye: No discharge.        Left eye: No discharge.     Extraocular Movements: Extraocular movements intact.     Conjunctiva/sclera: Conjunctivae normal.     Pupils: Pupils are equal, round, and reactive to light.  Neck:     Vascular: No carotid bruit.  Cardiovascular:     Rate and Rhythm: Normal rate and regular rhythm.  Pulmonary:     Effort: Pulmonary effort is normal.     Breath sounds: Normal breath sounds.  Abdominal:     General: Bowel sounds are normal.  Musculoskeletal:     Cervical back: No rigidity or tenderness.     Right lower leg: No edema.     Left lower leg: No edema.  Lymphadenopathy:     Cervical: No cervical adenopathy.  Skin:    General: Skin is warm and dry.  Neurological:     Mental Status: He is alert and oriented to person, place, and time.  Psychiatric:        Behavior: Behavior normal.    BP  110/66   Pulse 73   Temp 97.8 F (36.6 C) (Skin)   Ht '5\' 8"'$  (1.727 m)   Wt 211 lb 3.2 oz (95.8 kg)   SpO2 98%   BMI 32.11 kg/m  Wt Readings from  Last 3 Encounters:  12/01/20 211 lb 3.2 oz (95.8 kg)  08/26/20 213 lb 12.8 oz (97 kg)  07/07/20 212 lb 3.2 oz (96.3 kg)     Health Maintenance Due  Topic Date Due   Zoster Vaccines- Shingrix (1 of 2) Never done   COLONOSCOPY (Pts 45-20yr Insurance coverage will need to be confirmed)  01/01/2018    There are no preventive care reminders to display for this patient.  Lab Results  Component Value Date   TSH 3.21 05/25/2020   Lab Results  Component Value Date   WBC 5.3 07/30/2019   HGB 14.5 07/30/2019   HCT 41.9 07/30/2019   MCV 96.0 07/30/2019   PLT 346.0 07/30/2019   Lab Results  Component Value Date   NA 141 08/26/2020   K 4.3 08/26/2020   CO2 31 08/26/2020   GLUCOSE 96 08/26/2020   BUN 20 08/26/2020   CREATININE 0.93 08/26/2020   BILITOT 1.3 (H) 12/31/2018   ALKPHOS 75 12/31/2018   AST 21 12/31/2018   ALT 29 12/31/2018   PROT 6.4 12/31/2018   ALBUMIN 4.6 12/31/2018   CALCIUM 9.3 08/26/2020   GFR 77.49 08/26/2020   Lab Results  Component Value Date   CHOL 143 01/16/2020   Lab Results  Component Value Date   HDL 38.60 (L) 01/16/2020   Lab Results  Component Value Date   LDLCALC 75 01/16/2020   Lab Results  Component Value Date   TRIG 146.0 01/16/2020   Lab Results  Component Value Date   CHOLHDL 4 01/16/2020   Lab Results  Component Value Date   HGBA1C 5.4 10/30/2019      Assessment & Plan:   Problem List Items Addressed This Visit       Cardiovascular and Mediastinum   Essential hypertension   Relevant Medications   hydrochlorothiazide (HYDRODIURIL) 25 MG tablet   Other Relevant Orders   Basic metabolic panel   Urinalysis, Routine w reflex microscopic     Endocrine   Acquired hypothyroidism   Relevant Medications   levothyroxine (SYNTHROID) 75 MCG tablet     Other   Elevated  glucose   Relevant Orders   Basic metabolic panel   Hemoglobin A1c   Other Visit Diagnoses     Need for immunization against influenza    -  Primary   Relevant Orders   Flu Vaccine QUAD High Dose(Fluad) (Completed)       Meds ordered this encounter  Medications   levothyroxine (SYNTHROID) 75 MCG tablet    Sig: TAKE 1 TABLET(75 MCG) BY MOUTH DAILY BEFORE AND BREAKFAST    Dispense:  90 tablet    Refill:  0   hydrochlorothiazide (HYDRODIURIL) 25 MG tablet    Sig: Take 1 tablet (25 mg total) by mouth daily.    Dispense:  90 tablet    Refill:  3     Follow-up: Return in about 6 months (around 05/31/2021).  Discussed the importance of increasing potassium in his diet and following his potassium levels with HCTZ.  Also advised him to have the Zovirax vaccine through his pharmacy.  Information was given about this vaccine.  Advised weight loss and regular exercise for 30 minutes on most days of the week.  Assuming normal blood work follow-up in 6 months.  WLibby Maw MD

## 2020-12-16 ENCOUNTER — Other Ambulatory Visit: Payer: Self-pay | Admitting: Family Medicine

## 2020-12-16 DIAGNOSIS — Z0000 Encounter for general adult medical examination without abnormal findings: Secondary | ICD-10-CM

## 2020-12-22 ENCOUNTER — Telehealth: Payer: Self-pay

## 2020-12-22 NOTE — Progress Notes (Signed)
    Chronic Care Management Pharmacy Assistant   Name: Travis Schwartz  MRN: [id number] DOB: [birth year]  Chart Review for Clinical Pharmacist on 12/24/2020.  Conditions to be addressed/monitored: HTN, HLD, and GERD, Hypothyroidism, Osteopenia  Primary concerns for visit include: Overall Health   Recent office visits:  12/01/2020 Dr.Kremer MD (PCP) No Medication Changes noted, Return in about 6 months  08/26/2020 Dr.Kremer MD (PCP)  Start doxycycline 100 mg 2 times daily  07/07/2020 Caroleen Hamman LPN (PCP Office) Medicare wellness completed.  Recent consult visits:  No recent [facility] visits:  None in previous 6 months  Have you seen any other providers since your last visit?   Patient denies seeing any other providers since his last visit.  Any changes in your medications or health?   Patient denies any changes in his medications or health.  Any side effects from any medications?   Patient denies any side effects from his medications at this time.  Do you have an symptoms or problems not managed by your medications?   Patient denies any symptoms or problem that are not manage by his medications at this time.  Any concerns about your health right now?   Patient denies any concerns at this time.  Has your provider asked that you check blood pressure, blood sugar, or follow special diet at home?   Patient reports he checks his blood pressure infrequently.Patient states his blood pressure was low last time he had a doctors appointment.  Do you get any type of exercise on a regular basis?   Patient states he does not exercise on a regular basis.  Can you think of a goal you would like to reach for your health?   Patient will inform clinical pharmacist  Do you have any problems getting your medications?    Patient denies any problems getting his medications.  Is there anything that you would like to discuss during the appointment?     Overall  Health  Please bring medications and supplements to appointment     Patient is aware to bring all his medication and supplement to his appointment on 12/24/2020.  Medications: Outpatient Encounter Medications as of 12/22/2020  Medication Sig   atorvastatin (LIPITOR) 40 MG tablet Take 1 tablet (40 mg total) by mouth daily.   COVID-19 mRNA Vac-TriS, Pfizer, SUSP injection Inject into the muscle.   hydrochlorothiazide (HYDRODIURIL) 25 MG tablet Take 1 tablet (25 mg total) by mouth daily.   levothyroxine (SYNTHROID) 75 MCG tablet TAKE 1 TABLET(75 MCG) BY MOUTH DAILY BEFORE AND BREAKFAST   omeprazole (PRILOSEC) 20 MG capsule TAKE 1 CAPSULE BY MOUTH DAILY   No facility-administered encounter medications on file as of 12/22/2020.    Care Gaps: Shingrix Vaccine Colonoscopy (Last Completed 01/01/2018) Star Rating Drugs: Atorvastatin 40 mg last filled 10/02/2020 for 90 day supply at [facility]. Medications Fill Gaps: None  Web designer [phone]

## 2020-12-23 ENCOUNTER — Telehealth: Payer: Self-pay

## 2020-12-23 NOTE — Progress Notes (Signed)
Per Clinical Pharmacist, Please reach out to patient to see if he is okay with changing his Office appointment to a telephone appointment.   Patient states he is okay with a telephone appointment. I apologize for the inconvenience it may have cause.   [place] Pharmacist Assistant [phone]

## 2020-12-24 ENCOUNTER — Ambulatory Visit (INDEPENDENT_AMBULATORY_CARE_PROVIDER_SITE_OTHER): Payer: Medicare Other

## 2020-12-24 DIAGNOSIS — E785 Hyperlipidemia, unspecified: Secondary | ICD-10-CM

## 2020-12-24 DIAGNOSIS — I10 Essential (primary) hypertension: Secondary | ICD-10-CM

## 2020-12-24 DIAGNOSIS — E039 Hypothyroidism, unspecified: Secondary | ICD-10-CM

## 2020-12-24 DIAGNOSIS — K2100 Gastro-esophageal reflux disease with esophagitis, without bleeding: Secondary | ICD-10-CM

## 2020-12-24 NOTE — Patient Instructions (Signed)
Visit Information It was great speaking with you today!  Please let me know if you have any questions about our visit.   Goals Addressed             This Visit's Progress    Track and Manage My Blood Pressure-Hypertension       Timeframe:  Long-Range Goal Priority:  High Start Date: 12/24/2020                             Expected End Date: 12/24/2021                      Follow Up within 90 days   - check blood pressure weekly    Why is this important?   You won't feel high blood pressure, but it can still hurt your blood vessels.  High blood pressure can cause heart or kidney problems. It can also cause a stroke.  Making lifestyle changes like losing a little weight or eating less salt will help.  Checking your blood pressure at home and at different times of the day can help to control blood pressure.  If the doctor prescribes medicine remember to take it the way the doctor ordered.  Call the office if you cannot afford the medicine or if there are questions about it.     Notes:         Patient Care Plan: General Pharmacy (Adult)     Problem Identified: Hypertension, Hyperlipidemia, GERD, Hypothyroidism, and Osteopenia   Priority: High     Long-Range Goal: Patient-Specific Goal   Start Date: 12/24/2020  Expected End Date: 12/24/2021  This Visit's Progress: On track  Priority: High  Note:   Current Barriers:  No barriers noted  Pharmacist Clinical Goal(s):  Patient will maintain control of blood pressure as evidenced by BP less than 140/90  through collaboration with PharmD and provider.   Interventions: 1:1 collaboration with Libby Maw, MD regarding development and update of comprehensive plan of care as evidenced by provider attestation and co-signature Inter-disciplinary care team collaboration (see longitudinal plan of care) Comprehensive medication review performed; medication list updated in electronic medical record  Hypertension (BP goal  <140/90) -Controlled -Current treatment: HCTZ 25 mg daily  -Medications previously tried: NA  -Current home readings: Does not routinely monitor -Current dietary habits: Eats out 3-4 times weekly. 1 cup coffee  -Current exercise habits: Yardwork.  -Denies hypotensive/hypertensive symptoms -Recommended to continue current medication  Hyperlipidemia: (LDL goal < 100) -Uncontrolled -Current treatment: Atorvastatin 40 mg daily  -Medications previously tried: NA  -Recommended to continue current medication  Hypothyroidism (Goal: Maintain thyroid function) -Controlled -Current treatment  Levothyroxine 75 mcg daily  -Medications previously tried: NA  -Separates from food by at least 30 minutes -Recommended to continue current medication  GERD (Goal: Minimize reflux symptoms) -Controlled -Current treatment  Omeprazole 20 mg daily  -Medications previously tried: NA  -Notices mild symptoms if he misses a dose -Recommended to continue current medication  Osteoarthritis (Goal: Minimize pain) -Not ideally controlled -Current treatment  Aleve 220 mg daily  -Medications previously tried: NA -Discussed risks of frequent NSAID Use  -START Acetaminophen CR 650 mg every 8 hours as needed  -DECREASE aleve to 1 tablet daily as needed for moderate-severe arthritis pain -Recommended to continue current medication  Patient Goals/Self-Care Activities Patient will:  - check blood pressure weekly, document, and provide at future appointments  Follow Up Plan: Telephone  follow up appointment with care management team member scheduled for:  07/01/2021 at 11:00 AM    Mr. Mimbs was given information about Chronic Care Management services today including:  CCM service includes personalized support from designated clinical staff supervised by his physician, including individualized plan of care and coordination with other care providers 24/7 contact phone numbers for assistance for urgent and  routine care needs. Standard insurance, coinsurance, copays and deductibles apply for chronic care management only during months in which we provide at least 20 minutes of these services. Most insurances cover these services at 100%, however patients may be responsible for any copay, coinsurance and/or deductible if applicable. This service may help you avoid the need for more expensive face-to-face services. Only one practitioner may furnish and bill the service in a calendar month. The patient may stop CCM services at any time (effective at the end of the month) by phone call to the office staff.  Patient agreed to services and verbal consent obtained.   Patient verbalizes understanding of instructions provided today and agrees to view in [place].   Junius Argyle, PharmD, Para March, CPP Clinical Pharmacist [facility]  [phone]

## 2020-12-24 NOTE — Progress Notes (Signed)
Chronic Care Management Pharmacy Note  12/24/2020 Name:  Travis Schwartz MRN:  [id number] DOB:  27-May-1940  Summary: Patient presents for initial CCM consult. He is doing well overall. He has started taking Aleve on a daily basis for arthritis pain.   Recommendations/Changes made from today's visit: -START Acetaminophen CR 650 mg every 8 hours as needed  -DECREASE aleve to 1 tablet daily as needed for moderate-severe arthritis pain -Patient is due for DEXA Scan  Plan: CPP follow-up 6 months   Recommended Problem List Changes:  Add: Osteoarthritis  Subjective: Travis Schwartz is an 80 y.o. year old male who is a primary patient of Ethelene Hal, Mortimer Fries, MD.  The CCM team was consulted for assistance with disease management and care coordination needs.    Engaged with patient by telephone for initial visit in response to provider referral for pharmacy case management and/or care coordination services.   Consent to Services:  The patient was given the following information about Chronic Care Management services today, agreed to services, and gave verbal consent: 1. CCM service includes personalized support from designated clinical staff supervised by the primary care provider, including individualized plan of care and coordination with other care providers 2. 24/7 contact phone numbers for assistance for urgent and routine care needs. 3. Service will only be billed when office clinical staff spend 20 minutes or more in a month to coordinate care. 4. Only one practitioner may furnish and bill the service in a calendar month. 5.The patient may stop CCM services at any time (effective at the end of the month) by phone call to the office staff. 6. The patient will be responsible for cost sharing (co-pay) of up to 20% of the service fee (after annual deductible is met). Patient agreed to services and consent obtained.  Patient Care Team: Libby Maw, MD as PCP - General (Family  Medicine) Germaine Pomfret, [facility] as Pharmacist (Pharmacist)  Recent office visits: 12/01/2020 Dr.Kremer MD (PCP) No Medication Changes noted, Return in about 6 months  08/26/2020 Dr.Kremer MD (PCP)  Start doxycycline 100 mg 2 times daily  07/07/2020 Caroleen Hamman LPN (PCP Office) Medicare wellness completed  Recent consult visits: No recent [facility] visits: None in previous 6 months   Objective:  Lab Results  Component Value Date   CREATININE 0.89 12/01/2020   BUN 29 (H) 12/01/2020   GFR 80.72 12/01/2020   GFRNONAA >60 10/28/2010   GFRAA >60 10/28/2010   NA 142 12/01/2020   K 3.9 12/01/2020   CALCIUM 9.1 12/01/2020   CO2 27 12/01/2020   GLUCOSE 90 12/01/2020    Lab Results  Component Value Date/Time   HGBA1C 5.5 12/01/2020 10:44 AM   HGBA1C 5.4 10/30/2019 10:07 AM   GFR 80.72 12/01/2020 10:44 AM   GFR 77.49 08/26/2020 10:59 AM   MICROALBUR 1.4 12/31/2018 09:57 AM    Last diabetic Eye exam: No results found for: HMDIABEYEEXA  Last diabetic Foot exam: No results found for: HMDIABFOOTEX   Lab Results  Component Value Date   CHOL 143 01/16/2020   HDL 38.60 (L) 01/16/2020   LDLCALC 75 01/16/2020   LDLDIRECT 72.0 07/30/2019   TRIG 146.0 01/16/2020   CHOLHDL 4 01/16/2020    Hepatic Function Latest Ref Rng & Units 12/31/2018 12/25/2017 12/19/2016  Total Protein 6.0 - 8.3 g/dL 6.4 6.6 6.1  Albumin 3.5 - 5.2 g/dL 4.6 4.5 4.4  AST 0 - 37 U/L _0 ALT 0 - 53  U/L 29 27 35  Alk Phosphatase 39 - 117 U/L 75 80 57  Total Bilirubin 0.2 - 1.2 mg/dL 1.3(H) 1.6(H) 1.1  Bilirubin, Direct 0.0 - 0.3 mg/dL - 0.2 0.2    Lab Results  Component Value Date/Time   TSH 3.21 05/25/2020 10:55 AM   TSH 2.02 07/30/2019 10:05 AM   FREET4 0.71 01/02/2018 09:53 AM    CBC Latest Ref Rng & Units 07/30/2019 12/31/2018 12/25/2017  WBC 4.0 - 10.5 K/uL 5.3 5.7 7.4  Hemoglobin 13.0 - 17.0 g/dL 14.5 15.1 15.1  Hematocrit 39.0 - 52.0 % 41.9 43.6 44.3  Platelets 150.0 -  400.0 K/uL 346.0 395.0 376.0    No results found for: VD25OH  Clinical ASCVD: No  The ASCVD Risk score (Arnett DK, et al., 2019) failed to calculate for the following reasons:   The 2019 ASCVD risk score is only valid for ages 31 to 4    Depression screen PHQ 2/9 12/01/2020 08/26/2020 07/07/2020  Decreased Interest 0 0 0  Down, Depressed, Hopeless 0 0 0  PHQ - 2 Score 0 0 0  Altered sleeping - - -  Tired, decreased energy - - -  Change in appetite - - -  Feeling bad or failure about yourself  - - -  Trouble concentrating - - -  Moving slowly or fidgety/restless - - -  Suicidal thoughts - - -  PHQ-9 Score - - -  Difficult doing work/chores - - -    Social History   Tobacco Use  Smoking Status Former   Packs/day: 2.00   Years: 3.00   Pack years: 6.00   Types: Cigarettes   Quit date: 03/22/1963   Years since quitting: 57.8  Smokeless Tobacco Former   BP Readings from Last 3 Encounters:  12/01/20 110/66  08/26/20 132/74  07/07/20 118/70   Pulse Readings from Last 3 Encounters:  12/01/20 73  08/26/20 67  07/07/20 77   Wt Readings from Last 3 Encounters:  12/01/20 211 lb 3.2 oz (95.8 kg)  08/26/20 213 lb 12.8 oz (97 kg)  07/07/20 212 lb 3.2 oz (96.3 kg)   BMI Readings from Last 3 Encounters:  12/01/20 32.11 kg/m  08/26/20 32.51 kg/m  07/07/20 32.26 kg/m    Assessment/Interventions: Review of patient past medical history, allergies, medications, health status, including review of consultants reports, laboratory and other test data, was performed as part of comprehensive evaluation and provision of chronic care management services.   SDOH:  (Social Determinants of Health) assessments and interventions performed: Yes SDOH Interventions    Flowsheet Row Most Recent Value  SDOH Interventions   Financial Strain Interventions Intervention Not Indicated      SDOH Screenings   Alcohol Screen: Low Risk    Last Alcohol Screening Score (AUDIT): 0  Depression  (PHQ2-9): Low Risk    PHQ-2 Score: 0  Financial Resource Strain: Low Risk    Difficulty of Paying Living Expenses: Not hard at all  Food Insecurity: No Food Insecurity   Worried About Charity fundraiser in the Last Year: Never true   Ran Out of Food in the Last Year: Never true  Housing: Low Risk    Last Housing Risk Score: 0  Physical Activity: Insufficiently Active   Days of Exercise per Week: 2 days   Minutes of Exercise per Session: 50 min  Social Connections: Moderately Isolated   Frequency of Communication with Friends and Family: More than three times a week   Frequency of Social Gatherings  with Friends and Family: More than three times a week   Attends Religious Services: Never   Active Member of Clubs or Organizations: No   Attends Music therapist: Never   Marital Status: Married  Stress: No Stress Concern Present   Feeling of Stress : Not at all  Tobacco Use: Medium Risk   Smoking Tobacco Use: Former   Smokeless Tobacco Use: Former  Transport planner Needs: No Data processing manager (Medical): No   Lack of Transportation (Non-Medical): No    CCM Care Plan  No Known Allergies  Medications Reviewed Today     Reviewed by Libby Maw, MD (Physician) on 12/01/20 at 1102  Med List Status: <None>   Medication Order Taking? Sig Documenting Provider Last Dose Status Informant  atorvastatin (LIPITOR) 40 MG tablet [id number] Yes Take 1 tablet (40 mg total) by mouth daily. Libby Maw, MD Taking Active   COVID-19 mRNA Vac-TriS, Pfizer, SUSP injection [id number] Yes Inject into the muscle. Carlyle Basques, MD Taking Active   hydrochlorothiazide (HYDRODIURIL) 25 MG tablet [id number]  Take 1 tablet (25 mg total) by mouth daily. Libby Maw, MD  Active   levothyroxine (SYNTHROID) 75 MCG tablet [id number]  TAKE 1 TABLET(75 MCG) BY MOUTH DAILY BEFORE AND BREAKFAST Libby Maw, MD  Active   omeprazole  (PRILOSEC) 20 MG capsule [id number] Yes TAKE 1 CAPSULE BY MOUTH DAILY Libby Maw, MD Taking Active             Patient Active Problem List   Diagnosis Date Noted   Epidermal inclusion cyst 08/26/2020   Poorly controlled blood pressure 05/25/2020   Fall 01/16/2020   Trochanteric bursitis of left hip 10/30/2019   Elevated glucose 10/30/2019   Left hip pain 05/02/2019   Acquired hypothyroidism 01/01/2019   Abnormal TSH 12/31/2018   Need for influenza vaccination 12/31/2018   Dysuria 12/25/2017   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   Osteopenia 08/15/2017   Compression fracture of thoracic spine, non-traumatic ([place]) 07/26/2017   Essential hypertension 12/30/2014   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   ERECTILE DYSFUNCTION 03/30/2010   DYSPHAGIA UNSPECIFIED 04/30/2009   Hyperlipidemia 01/29/2008   GERD 01/29/2008   Allergic rhinitis 01/22/2008   History of prostate cancer 11/24/2006    Immunization History  Administered Date(s) Administered   Fluad Quad(high Dose 65+) 12/31/2018, 01/16/2020, 12/01/2020   Influenza Whole 03/21/2004, 12/19/2008   Influenza, High Dose Seasonal PF 12/16/2015, 12/19/2016, 12/26/2017   Influenza,inj,Quad PF,6+ Mos 01/14/2014, 12/15/2014   Influenza,inj,quad, With Preservative 11/19/2016   PFIZER Comirnaty(Gray Top)Covid-19 Tri-Sucrose Vaccine 09/15/2020   PFIZER(Purple Top)SARS-COV-2 Vaccination 04/25/2019, 05/23/2019, 03/06/2020   Pneumococcal Conjugate-13 12/15/2014   Pneumococcal Polysaccharide-23 03/21/2005   Pneumococcal-Unspecified 11/19/2016   Td 03/21/2005   Tdap 12/16/2015   Zoster, Live 01/22/2008    Conditions to be addressed/monitored:  Hypertension, Hyperlipidemia, GERD, Hypothyroidism, and Osteopenia  Care Plan : General Pharmacy (Adult)  Updates made by Germaine Pomfret, [place] since 12/24/2020 12:00 AM     Problem: Hypertension, Hyperlipidemia, GERD, Hypothyroidism, and Osteopenia    Priority: High     Long-Range Goal: Patient-Specific Goal   Start Date: 12/24/2020  Expected End Date: 12/24/2021  This Visit's Progress: On track  Priority: High  Note:   Current Barriers:  No barriers noted  Pharmacist Clinical Goal(s):  Patient will maintain control of blood pressure as evidenced by BP less than 140/90  through collaboration with PharmD and provider.   Interventions: 1:1 collaboration with  Libby Maw, MD regarding development and update of comprehensive plan of care as evidenced by provider attestation and co-signature Inter-disciplinary care team collaboration (see longitudinal plan of care) Comprehensive medication review performed; medication list updated in electronic medical record  Hypertension (BP goal <140/90) -Controlled -Current treatment: HCTZ 25 mg daily  -Medications previously tried: NA  -Current home readings: Does not routinely monitor -Current dietary habits: Eats out 3-4 times weekly. 1 cup coffee  -Current exercise habits: Yardwork.  -Denies hypotensive/hypertensive symptoms -Recommended to continue current medication  Hyperlipidemia: (LDL goal < 100) -Uncontrolled -Current treatment: Atorvastatin 40 mg daily  -Medications previously tried: NA  -Recommended to continue current medication  Hypothyroidism (Goal: Maintain thyroid function) -Controlled -Current treatment  Levothyroxine 75 mcg daily  -Medications previously tried: NA  -Separates from food by at least 30 minutes -Recommended to continue current medication  GERD (Goal: Minimize reflux symptoms) -Controlled -Current treatment  Omeprazole 20 mg daily  -Medications previously tried: NA  -Notices mild symptoms if he misses a dose -Recommended to continue current medication  Osteoarthritis (Goal: Minimize pain) -Not ideally controlled -Current treatment  Aleve 220 mg daily  -Medications previously tried: NA -Discussed risks of frequent NSAID Use   -START Acetaminophen CR 650 mg every 8 hours as needed  -DECREASE aleve to 1 tablet daily as needed for moderate-severe arthritis pain -Recommended to continue current medication  Patient Goals/Self-Care Activities Patient will:  - check blood pressure weekly, document, and provide at future appointments  Follow Up Plan: Telephone follow up appointment with care management team member scheduled for:  07/01/2021 at 11:00 AM      Medication Assistance: None required.  Patient affirms current coverage meets needs.  Compliance/Adherence/Medication fill history: Care Gaps: Shingrix Colonoscopy   Star-Rating Drugs: Atorvastatin 40 mg last filled 10/02/2020 for 90 day supply at [facility].  Patient's preferred pharmacy is:  [facility], [place] [address] [place] [address] Phone: [phone] Fax: [phone]  [id number] - [place], [place] - [address] AT [place] [address] [place] [address] Phone: [phone] Fax: [phone]  Uses pill box? Yes Pt endorses 100% compliance  We discussed: Current pharmacy is preferred with insurance plan and patient is satisfied with pharmacy services Patient decided to: Continue current medication management strategy  Care Plan and Follow Up Patient Decision:  Patient agrees to Care Plan and Follow-up.  Plan: Telephone follow up appointment with care management team member scheduled for:  07/01/2021 at 11:00 AM  Junius Argyle, PharmD, Para March, CPP Clinical Pharmacist [facility] at [facility]  [phone]

## 2020-12-29 ENCOUNTER — Other Ambulatory Visit: Payer: Self-pay | Admitting: Family Medicine

## 2020-12-29 DIAGNOSIS — E785 Hyperlipidemia, unspecified: Secondary | ICD-10-CM

## 2021-01-18 DIAGNOSIS — I10 Essential (primary) hypertension: Secondary | ICD-10-CM

## 2021-01-18 DIAGNOSIS — E039 Hypothyroidism, unspecified: Secondary | ICD-10-CM | POA: Diagnosis not present

## 2021-01-18 DIAGNOSIS — E785 Hyperlipidemia, unspecified: Secondary | ICD-10-CM | POA: Diagnosis not present

## 2021-02-09 ENCOUNTER — Other Ambulatory Visit: Payer: Self-pay | Admitting: Family Medicine

## 2021-02-09 DIAGNOSIS — E039 Hypothyroidism, unspecified: Secondary | ICD-10-CM

## 2021-03-16 ENCOUNTER — Other Ambulatory Visit: Payer: Self-pay | Admitting: Family Medicine

## 2021-03-16 DIAGNOSIS — Z0000 Encounter for general adult medical examination without abnormal findings: Secondary | ICD-10-CM

## 2021-03-30 ENCOUNTER — Other Ambulatory Visit: Payer: Self-pay | Admitting: Family Medicine

## 2021-03-30 DIAGNOSIS — E785 Hyperlipidemia, unspecified: Secondary | ICD-10-CM

## 2021-04-06 ENCOUNTER — Telehealth: Payer: Self-pay

## 2021-04-06 NOTE — Progress Notes (Signed)
° ° °  Chronic Care Management Pharmacy Assistant   Name: LIAHM GRIVAS  MRN: [id number] DOB: 1940-10-05  Reason for Encounter:Hypertension Disease State Call.   Recent office visits:  No recent office visit  Recent consult visits:  No recent consult visit  Hospital visits:  None in previous 6 months  Medications: Outpatient Encounter Medications as of 04/06/2021  Medication Sig   atorvastatin (LIPITOR) 40 MG tablet TAKE 1 TABLET(40 MG) BY MOUTH DAILY   hydrochlorothiazide (HYDRODIURIL) 25 MG tablet Take 1 tablet (25 mg total) by mouth daily.   levothyroxine (SYNTHROID) 75 MCG tablet TAKE 1 TABLET(75 MCG) BY MOUTH DAILY BEFORE BREAKFAST   Multiple Vitamins-Minerals (CENTRUM SILVER 50+MEN PO) Take 1 tablet by mouth daily.   naproxen sodium (ALEVE) 220 MG tablet Take 220 mg by mouth daily.   omeprazole (PRILOSEC) 20 MG capsule TAKE 1 CAPSULE BY MOUTH DAILY   No facility-administered encounter medications on file as of 04/06/2021.   Care Gaps: Shingrix Vaccine Colonoscopy (Last Completed 01/01/2018) COVID-19 Vaccine Star Rating Drugs: Atorvastatin 40 mg last filled 03/29/2021 for 90 day supply at [facility]. Medications Fill Gaps: None  Reviewed chart prior to disease state call. Spoke with patient regarding BP  Recent Office Vitals: BP Readings from Last 3 Encounters:  12/01/20 110/66  08/26/20 132/74  07/07/20 118/70   Pulse Readings from Last 3 Encounters:  12/01/20 73  08/26/20 67  07/07/20 77    Wt Readings from Last 3 Encounters:  12/01/20 211 lb 3.2 oz (95.8 kg)  08/26/20 213 lb 12.8 oz (97 kg)  07/07/20 212 lb 3.2 oz (96.3 kg)     Kidney Function Lab Results  Component Value Date/Time   CREATININE 0.89 12/01/2020 10:44 AM   CREATININE 0.93 08/26/2020 10:59 AM   GFR 80.72 12/01/2020 10:44 AM   GFRNONAA >60 10/28/2010 03:58 AM   GFRAA >60 10/28/2010 03:58 AM    BMP Latest Ref Rng & Units 12/01/2020 08/26/2020 05/25/2020  Glucose 70 - 99 mg/dL 90  96 135(H)  BUN 6 - 23 mg/dL 29(H) 20 20  Creatinine 0.40 - 1.50 mg/dL 0.89 0.93 0.91  Sodium 135 - 145 mEq/L 142 141 139  Potassium 3.5 - 5.1 mEq/L 3.9 4.3 3.8  Chloride 96 - 112 mEq/L 104 102 104  CO2 19 - 32 mEq/L 27 31 27   Calcium 8.4 - 10.5 mg/dL 9.1 9.3 9.2    Current antihypertensive regimen:  HCTZ 25 mg daily   Patient denies any headaches,lightheadedness, or dizziness.  How often are you checking your Blood Pressure? infrequently Current home BP readings:  Patient states he has not check his blood pressure in awhile.Patient states he is doing good with no complaints.  What recent interventions/DTPs have been made by any provider to improve Blood Pressure control since last CPP Visit: None ID Any recent hospitalizations or ED visits since last visit with CPP? No What diet changes have been made to improve Blood Pressure Control?  Patient denies any changes to his diet. What exercise is being done to improve your Blood Pressure Control?  Patient states he does a lot of yardwork.  Adherence Review: Is the patient currently on ACE/ARB medication? No Does the patient have >5 day gap between last estimated fill dates? No  Telephone follow up appointment with care management team member scheduled for:  07/01/2021 at 11:00 AM  [place] Pharmacist Assistant [phone]

## 2021-05-19 ENCOUNTER — Other Ambulatory Visit: Payer: Self-pay | Admitting: Family Medicine

## 2021-05-19 DIAGNOSIS — E039 Hypothyroidism, unspecified: Secondary | ICD-10-CM

## 2021-05-31 ENCOUNTER — Encounter: Payer: Self-pay | Admitting: Family Medicine

## 2021-05-31 ENCOUNTER — Other Ambulatory Visit: Payer: Self-pay

## 2021-05-31 ENCOUNTER — Ambulatory Visit (INDEPENDENT_AMBULATORY_CARE_PROVIDER_SITE_OTHER): Payer: Medicare Other | Admitting: Family Medicine

## 2021-05-31 VITALS — BP 132/80 | HR 75 | Temp 97.4°F | Ht 68.0 in | Wt 215.6 lb

## 2021-05-31 DIAGNOSIS — I10 Essential (primary) hypertension: Secondary | ICD-10-CM | POA: Diagnosis not present

## 2021-05-31 DIAGNOSIS — R7309 Other abnormal glucose: Secondary | ICD-10-CM | POA: Diagnosis not present

## 2021-05-31 DIAGNOSIS — E039 Hypothyroidism, unspecified: Secondary | ICD-10-CM | POA: Diagnosis not present

## 2021-05-31 DIAGNOSIS — E785 Hyperlipidemia, unspecified: Secondary | ICD-10-CM | POA: Diagnosis not present

## 2021-05-31 DIAGNOSIS — Z8546 Personal history of malignant neoplasm of prostate: Secondary | ICD-10-CM | POA: Diagnosis not present

## 2021-05-31 LAB — URINALYSIS
Hgb urine dipstick: NEGATIVE
Leukocytes,Ua: NEGATIVE
Nitrite: NEGATIVE
Specific Gravity, Urine: 1.015 (ref 1.000–1.030)
Urine Glucose: NEGATIVE
Urobilinogen, UA: 1 (ref 0.0–1.0)
pH: 6 (ref 5.0–8.0)

## 2021-05-31 LAB — BASIC METABOLIC PANEL
BUN: 23 mg/dL (ref 6–23)
CO2: 26 mEq/L (ref 19–32)
Calcium: 9.3 mg/dL (ref 8.4–10.5)
Chloride: 102 mEq/L (ref 96–112)
Creatinine, Ser: 0.93 mg/dL (ref 0.40–1.50)
GFR: 77.08 mL/min (ref >60.00)
Glucose, Bld: 99 mg/dL (ref 70–99)
Potassium: 3.8 mEq/L (ref 3.5–5.1)
Sodium: 141 mEq/L (ref 135–145)

## 2021-05-31 LAB — LIPID PANEL
Cholesterol: 141 mg/dL (ref 0–200)
HDL: 40.5 mg/dL (ref >39.00)
LDL Cholesterol: 62 mg/dL (ref 0–99)
NonHDL: 100.78
Total CHOL/HDL Ratio: 3
Triglycerides: 196 mg/dL — ABNORMAL HIGH (ref 0.0–149.0)
VLDL: 39.2 mg/dL (ref 0.0–40.0)

## 2021-05-31 LAB — HEMOGLOBIN A1C: Hgb A1c MFr Bld: 5.6 % (ref 4.6–6.5)

## 2021-05-31 LAB — CBC WITH DIFFERENTIAL/PLATELET
Basophils Absolute: 0 10*3/uL (ref 0.0–0.1)
Basophils Relative: 0.7 % (ref 0.0–3.0)
Eosinophils Absolute: 0.1 10*3/uL (ref 0.0–0.7)
Eosinophils Relative: 1.6 % (ref 0.0–5.0)
HCT: 42.1 % (ref 39.0–52.0)
Hemoglobin: 14.8 g/dL (ref 13.0–17.0)
Lymphocytes Relative: 22.1 % (ref 12.0–46.0)
Lymphs Abs: 1.2 10*3/uL (ref 0.7–4.0)
MCHC: 35.3 g/dL (ref 30.0–36.0)
MCV: 95 fl (ref 78.0–100.0)
Monocytes Absolute: 0.3 10*3/uL (ref 0.1–1.0)
Monocytes Relative: 6.6 % (ref 3.0–12.0)
Neutro Abs: 3.6 10*3/uL (ref 1.4–7.7)
Neutrophils Relative %: 69 % (ref 43.0–77.0)
Platelets: 354 10*3/uL (ref 150.0–400.0)
RBC: 4.43 Mil/uL (ref 4.22–5.81)
RDW: 16.2 % — ABNORMAL HIGH (ref 11.5–15.5)
WBC: 5.3 10*3/uL (ref 4.0–10.5)

## 2021-05-31 LAB — TSH: TSH: 2.88 u[IU]/mL (ref 0.35–5.50)

## 2021-05-31 LAB — PSA: PSA: 0.62 ng/mL (ref 0.10–4.00)

## 2021-05-31 NOTE — Progress Notes (Signed)
Established Patient Office Visit  Subjective:  Patient ID: Travis Schwartz, male    DOB: October 28, 1940  Age: 81 y.o. MRN: [id number]  CC:  Chief Complaint  Patient presents with   Follow-up    6 month follow up, concerns about frequent joint pains every morning. Patient fasting.     HPI Travis Schwartz presents for follow-up of hypertension, elevated glucose, dyslipidemia, history of prostate cancer, hypothyroidism and arthralgias.  Knees and hands bother him he has been taking Aleve every morning.  Also taking Prilosec and denies any reflux indigestion or burning up into the chest.  Dark tarry stools in his stool.  Blood pressure controlled with HCTZ.  Eating.  Staying as active as he can walk around the yard.  His son continues to live with him.  He is chronically disabled pain.  Past Medical History:  Diagnosis Date   Allergy    no per pt   Arthritis    Cancer [facility])    prostate   Diverticulosis    2008   GERD (gastroesophageal reflux disease)    Hiatal hernia    Hyperlipidemia    Prostate cancer ([place]) 1992    Past Surgical History:  Procedure Laterality Date   CATARACT EXTRACTION     both eyes   CHOLECYSTECTOMY     COLONOSCOPY     TONSILLECTOMY     TRANSURETHRAL RESECTION OF PROSTATE     UPPER GASTROINTESTINAL ENDOSCOPY      Family History  Problem Relation Age of Onset   Cancer Sister        uterine   Colon cancer Mother    Hyperlipidemia Other    COPD Other        colon   Esophageal cancer Neg Hx    Rectal cancer Neg Hx    Stomach cancer Neg Hx     Social History   Socioeconomic History   Marital status: Married    Spouse name: Not on file   Number of children: Not on file   Years of education: Not on file   Highest education level: Not on file  Occupational History   Not on file  Tobacco Use   Smoking status: Former    Packs/day: 2.00    Years: 3.00    Pack years: 6.00    Types: Cigarettes    Quit date: 03/22/1963    Years since quitting: 58.2    Smokeless tobacco: Former  Substance and Sexual Activity   Alcohol use: Yes    Alcohol/week: 0.0 standard drinks    Comment: rarely   Drug use: No   Sexual activity: Not on file  Other Topics Concern   Not on file  Social History Narrative   Lives with wife, son, and granddaughter in a 2 story home.  1 child.     Previously worked as a Administrator for 30 years.     High school education.   Social Determinants of Health   Financial Resource Strain: Low Risk    Difficulty of Paying Living Expenses: Not hard at all  Food Insecurity: No Food Insecurity   Worried About Charity fundraiser in the Last Year: Never true   [place] in the Last Year: Never true  Transportation Needs: No Transportation Needs   Lack of Transportation (Medical): No   Lack of Transportation (Non-Medical): No  Physical Activity: Insufficiently Active   Days of Exercise per Week: 2 days   Minutes of Exercise per  Session: 50 min  Stress: No Stress Concern Present   Feeling of Stress : Not at all  Social Connections: Moderately Isolated   Frequency of Communication with Friends and Family: More than three times a week   Frequency of Social Gatherings with Friends and Family: More than three times a week   Attends Religious Services: Never   Marine scientist or Organizations: No   Attends Music therapist: Never   Marital Status: Married  Human resources officer Violence: Not At Risk   Fear of Current or Ex-Partner: No   Emotionally Abused: No   Physically Abused: No   Sexually Abused: No    Outpatient Medications Prior to Visit  Medication Sig Dispense Refill   atorvastatin (LIPITOR) 40 MG tablet TAKE 1 TABLET(40 MG) BY MOUTH DAILY 90 tablet 0   hydrochlorothiazide (HYDRODIURIL) 25 MG tablet Take 1 tablet (25 mg total) by mouth daily. 90 tablet 3   levothyroxine (SYNTHROID) 75 MCG tablet TAKE 1 TABLET(75 MCG) BY MOUTH DAILY BEFORE AND BREAKFAST 90 tablet 0   Multiple  Vitamins-Minerals (CENTRUM SILVER 50+MEN PO) Take 1 tablet by mouth daily.     naproxen sodium (ALEVE) 220 MG tablet Take 220 mg by mouth daily.     omeprazole (PRILOSEC) 20 MG capsule TAKE 1 CAPSULE BY MOUTH DAILY 90 capsule 0   No facility-administered medications prior to visit.    No Known Allergies  ROS Review of Systems  Constitutional:  Negative for chills, diaphoresis, fatigue, fever and unexpected weight change.  HENT: Negative.    Eyes:  Negative for photophobia and visual disturbance.  Respiratory: Negative.    Cardiovascular:  Negative for palpitations.  Gastrointestinal: Negative.  Negative for nausea and vomiting.  Genitourinary:  Negative for difficulty urinating, frequency and urgency.  Musculoskeletal:  Positive for arthralgias. Negative for gait problem and joint swelling.  Neurological:  Negative for speech difficulty, weakness and light-headedness.  Psychiatric/Behavioral: Negative.       Objective:    Physical Exam Vitals and nursing note reviewed.  Constitutional:      General: He is not in acute distress.    Appearance: Normal appearance. He is not ill-appearing, toxic-appearing or diaphoretic.  HENT:     Head: Normocephalic and atraumatic.     Right Ear: Tympanic membrane, ear canal and external ear normal.     Left Ear: Tympanic membrane, ear canal and external ear normal.     Mouth/Throat:     Mouth: Mucous membranes are moist.     Pharynx: Oropharynx is clear. No oropharyngeal exudate or posterior oropharyngeal erythema.  Eyes:     General: No scleral icterus.       Right eye: No discharge.        Left eye: No discharge.     Extraocular Movements: Extraocular movements intact.     Conjunctiva/sclera: Conjunctivae normal.     Pupils: Pupils are equal, round, and reactive to light.  Neck:     Vascular: No carotid bruit.  Cardiovascular:     Rate and Rhythm: Normal rate and regular rhythm.  Pulmonary:     Effort: Pulmonary effort is normal.      Breath sounds: Normal breath sounds.  Musculoskeletal:     Cervical back: No rigidity or tenderness.  Lymphadenopathy:     Cervical: No cervical adenopathy.  Skin:    General: Skin is warm and dry.  Neurological:     Mental Status: He is alert and oriented to person, place, and time.  Psychiatric:        Mood and Affect: Mood normal.        Behavior: Behavior normal.    BP 132/80 (BP Location: Right Arm, Patient Position: Sitting, Cuff Size: Large)    Pulse 75    Temp (!) 97.4 F (36.3 C) (Temporal)    Ht '5\' 8"'$  (1.727 m)    Wt 215 lb 9.6 oz (97.8 kg)    SpO2 96%    BMI 32.78 kg/m  Wt Readings from Last 3 Encounters:  05/31/21 215 lb 9.6 oz (97.8 kg)  12/01/20 211 lb 3.2 oz (95.8 kg)  08/26/20 213 lb 12.8 oz (97 kg)     There are no preventive care reminders to display for this patient.  There are no preventive care reminders to display for this patient.  Lab Results  Component Value Date   TSH 3.21 05/25/2020   Lab Results  Component Value Date   WBC 5.3 07/30/2019   HGB 14.5 07/30/2019   HCT 41.9 07/30/2019   MCV 96.0 07/30/2019   PLT 346.0 07/30/2019   Lab Results  Component Value Date   NA 142 12/01/2020   K 3.9 12/01/2020   CO2 27 12/01/2020   GLUCOSE 90 12/01/2020   BUN 29 (H) 12/01/2020   CREATININE 0.89 12/01/2020   BILITOT 1.3 (H) 12/31/2018   ALKPHOS 75 12/31/2018   AST 21 12/31/2018   ALT 29 12/31/2018   PROT 6.4 12/31/2018   ALBUMIN 4.6 12/31/2018   CALCIUM 9.1 12/01/2020   GFR 80.72 12/01/2020   Lab Results  Component Value Date   CHOL 143 01/16/2020   Lab Results  Component Value Date   HDL 38.60 (L) 01/16/2020   Lab Results  Component Value Date   LDLCALC 75 01/16/2020   Lab Results  Component Value Date   TRIG 146.0 01/16/2020   Lab Results  Component Value Date   CHOLHDL 4 01/16/2020   Lab Results  Component Value Date   HGBA1C 5.5 12/01/2020      Assessment & Plan:   Problem List Items Addressed This Visit        Cardiovascular and Mediastinum   Essential hypertension - Primary   Relevant Orders   Basic metabolic panel   CBC with Differential/Platelet   Urinalysis     Endocrine   Acquired hypothyroidism   Relevant Orders   TSH     Other   Hyperlipidemia   Relevant Orders   Lipid panel   History of prostate cancer   Relevant Orders   PSA   Elevated glucose   Relevant Orders   Basic metabolic panel   Hemoglobin A1c    No orders of the defined types were placed in this encounter.   Follow-up: No follow-ups on file.  Recommended 30 minutes of exercise most days of the week as tolerated.  Walking would be fine.  Information was given on osteoarthritis.  Asked him to use the Aleve more on a as needed basis.  Asked him to look out for any increased reflux or dark tarry stools.  Continue all other medicines as directed.  Libby Maw, MD

## 2021-06-11 ENCOUNTER — Other Ambulatory Visit: Payer: Self-pay | Admitting: Family Medicine

## 2021-06-11 DIAGNOSIS — Z0000 Encounter for general adult medical examination without abnormal findings: Secondary | ICD-10-CM

## 2021-06-28 ENCOUNTER — Other Ambulatory Visit: Payer: Self-pay | Admitting: Family Medicine

## 2021-06-28 DIAGNOSIS — E785 Hyperlipidemia, unspecified: Secondary | ICD-10-CM

## 2021-07-01 ENCOUNTER — Telehealth: Payer: Medicare Other

## 2021-07-13 ENCOUNTER — Ambulatory Visit (INDEPENDENT_AMBULATORY_CARE_PROVIDER_SITE_OTHER): Payer: Medicare Other

## 2021-07-13 DIAGNOSIS — Z0000 Encounter for general adult medical examination without abnormal findings: Secondary | ICD-10-CM

## 2021-07-13 NOTE — Patient Instructions (Signed)
[name] , ?Thank you for taking time to come for your Medicare Wellness Visit. I appreciate your ongoing commitment to your health goals. Please review the following plan we discussed and let me know if I can assist you in the future.  ? ?Screening recommendations/referrals: ?Colonoscopy: no longer required  ?Recommended yearly ophthalmology/optometry visit for glaucoma screening and checkup ?Recommended yearly dental visit for hygiene and checkup ? ?Vaccinations: ?Influenza vaccine: completed  ?Pneumococcal vaccine: completed  ?Tdap vaccine: 12/16/2015 ?Shingles vaccine: will consider    ? ?Advanced directives: none  ? ?Conditions/risks identified: none  ? ?Next appointment: none  ? ?Preventive Care 45 Years and Older, Male ?Preventive care refers to lifestyle choices and visits with your health care provider that can promote health and wellness. ?What does preventive care include? ?A yearly physical exam. This is also called an annual well check. ?Dental exams once or twice a year. ?Routine eye exams. Ask your health care provider how often you should have your eyes checked. ?Personal lifestyle choices, including: ?Daily care of your teeth and gums. ?Regular physical activity. ?Eating a healthy diet. ?Avoiding tobacco and drug use. ?Limiting alcohol use. ?Practicing safe sex. ?Taking low doses of aspirin every day. ?Taking vitamin and mineral supplements as recommended by your health care provider. ?What happens during an annual well check? ?The services and screenings done by your health care provider during your annual well check will depend on your age, overall health, lifestyle risk factors, and family history of disease. ?Counseling  ?Your health care provider may ask you questions about your: ?Alcohol use. ?Tobacco use. ?Drug use. ?Emotional well-being. ?Home and relationship well-being. ?Sexual activity. ?Eating habits. ?History of falls. ?Memory and ability to understand (cognition). ?Work and work  Statistician. ?Screening  ?You may have the following tests or measurements: ?Height, weight, and BMI. ?Blood pressure. ?Lipid and cholesterol levels. These may be checked every 5 years, or more frequently if you are over 75 years old. ?Skin check. ?Lung cancer screening. You may have this screening every year starting at age 41 if you have a 30-pack-year history of smoking and currently smoke or have quit within the past 15 years. ?Fecal occult blood test (FOBT) of the stool. You may have this test every year starting at age 59. ?Flexible sigmoidoscopy or colonoscopy. You may have a sigmoidoscopy every 5 years or a colonoscopy every 10 years starting at age 57. ?Prostate cancer screening. Recommendations will vary depending on your family history and other risks. ?Hepatitis C blood test. ?Hepatitis B blood test. ?Sexually transmitted disease (STD) testing. ?Diabetes screening. This is done by checking your blood sugar (glucose) after you have not eaten for a while (fasting). You may have this done every 1-3 years. ?Abdominal aortic aneurysm (AAA) screening. You may need this if you are a current or former smoker. ?Osteoporosis. You may be screened starting at age 59 if you are at high risk. ?Talk with your health care provider about your test results, treatment options, and if necessary, the need for more tests. ?Vaccines  ?Your health care provider may recommend certain vaccines, such as: ?Influenza vaccine. This is recommended every year. ?Tetanus, diphtheria, and acellular pertussis (Tdap, Td) vaccine. You may need a Td booster every 10 years. ?Zoster vaccine. You may need this after age 31. ?Pneumococcal 13-valent conjugate (PCV13) vaccine. One dose is recommended after age 31. ?Pneumococcal polysaccharide (PPSV23) vaccine. One dose is recommended after age 88. ?Talk to your health care provider about which screenings and vaccines you need and  how often you need them. ?This information is not intended to replace  advice given to you by your health care provider. Make sure you discuss any questions you have with your health care provider. ?Document Released: 04/03/2015 Document Revised: 11/25/2015 Document Reviewed: 01/06/2015 ?Elsevier Interactive Patient Education ? 2017 Travis Schwartz. ? ?Fall Prevention in the Home ?Falls can cause injuries. They can happen to people of all ages. There are many things you can do to make your home safe and to help prevent falls. ?What can I do on the outside of my home? ?Regularly fix the edges of walkways and driveways and fix any cracks. ?Remove anything that might make you trip as you walk through a door, such as a raised step or threshold. ?Trim any bushes or trees on the path to your home. ?Use bright outdoor lighting. ?Clear any walking paths of anything that might make someone trip, such as rocks or tools. ?Regularly check to see if handrails are loose or broken. Make sure that both sides of any steps have handrails. ?Any raised decks and porches should have guardrails on the edges. ?Have any leaves, snow, or ice cleared regularly. ?Use sand or salt on walking paths during winter. ?Clean up any spills in your garage right away. This includes oil or grease spills. ?What can I do in the bathroom? ?Use night lights. ?Install grab bars by the toilet and in the tub and shower. Do not use towel bars as grab bars. ?Use non-skid mats or decals in the tub or shower. ?If you need to sit down in the shower, use a plastic, non-slip stool. ?Keep the floor dry. Clean up any water that spills on the floor as soon as it happens. ?Remove soap buildup in the tub or shower regularly. ?Attach bath mats securely with double-sided non-slip rug tape. ?Do not have throw rugs and other things on the floor that can make you trip. ?What can I do in the bedroom? ?Use night lights. ?Make sure that you have a light by your bed that is easy to reach. ?Do not use any sheets or blankets that are too big for your bed.  They should not hang down onto the floor. ?Have a firm chair that has side arms. You can use this for support while you get dressed. ?Do not have throw rugs and other things on the floor that can make you trip. ?What can I do in the kitchen? ?Clean up any spills right away. ?Avoid walking on wet floors. ?Keep items that you use a lot in easy-to-reach places. ?If you need to reach something above you, use a strong step stool that has a grab bar. ?Keep electrical cords out of the way. ?Do not use floor polish or wax that makes floors slippery. If you must use wax, use non-skid floor wax. ?Do not have throw rugs and other things on the floor that can make you trip. ?What can I do with my stairs? ?Do not leave any items on the stairs. ?Make sure that there are handrails on both sides of the stairs and use them. Fix handrails that are broken or loose. Make sure that handrails are as long as the stairways. ?Check any carpeting to make sure that it is firmly attached to the stairs. Fix any carpet that is loose or worn. ?Avoid having throw rugs at the top or bottom of the stairs. If you do have throw rugs, attach them to the floor with carpet tape. ?Make sure that you have  a light switch at the top of the stairs and the bottom of the stairs. If you do not have them, ask someone to add them for you. ?What else can I do to help prevent falls? ?Wear shoes that: ?Do not have high heels. ?Have rubber bottoms. ?Are comfortable and fit you well. ?Are closed at the toe. Do not wear sandals. ?If you use a stepladder: ?Make sure that it is fully opened. Do not climb a closed stepladder. ?Make sure that both sides of the stepladder are locked into place. ?Ask someone to hold it for you, if possible. ?Clearly mark and make sure that you can see: ?Any grab bars or handrails. ?First and last steps. ?Where the edge of each step is. ?Use tools that help you move around (mobility aids) if they are needed. These  include: ?Canes. ?Walkers. ?Scooters. ?Crutches. ?Turn on the lights when you go into a dark area. Replace any light bulbs as soon as they burn out. ?Set up your furniture so you have a clear path. Avoid moving your furniture aroun

## 2021-07-13 NOTE — Progress Notes (Signed)
? ?Subjective:  ? Travis Schwartz is a 81 y.o. male who presents for an Subsequent Medicare Annual Wellness Visit. ? ?Review of Systems    ? ?Cardiac Risk Factors include: advanced age (>20mn, >>65women);male gender ? ?   ?Objective:  ?  ?Today's Vitals  ? ?There is no height or weight on file to calculate BMI. ? ? ?  07/13/2021  ? 10:21 AM 07/07/2020  ? 10:31 AM 01/31/2020  ?  5:48 PM 07/03/2019  ?  9:33 AM  ?Advanced Directives  ?Does Patient Have a Medical Advance Directive? No No No No  ?Would patient like information on creating a medical advance directive? No - Patient declined Yes (MAU/Ambulatory/Procedural Areas - Information given)  No - Patient declined  ? ? ?Current Medications (verified) ?Outpatient Encounter Medications as of 07/13/2021  ?Medication Sig  ? atorvastatin (LIPITOR) 40 MG tablet TAKE 1 TABLET BY MOUTH EVERY DAY  ? hydrochlorothiazide (HYDRODIURIL) 25 MG tablet Take 1 tablet (25 mg total) by mouth daily.  ? levothyroxine (SYNTHROID) 75 MCG tablet TAKE 1 TABLET(75 MCG) BY MOUTH DAILY BEFORE AND BREAKFAST  ? Multiple Vitamins-Minerals (CENTRUM SILVER 50+MEN PO) Take 1 tablet by mouth daily.  ? naproxen sodium (ALEVE) 220 MG tablet Take 220 mg by mouth daily.  ? omeprazole (PRILOSEC) 20 MG capsule TAKE 1 CAPSULE BY MOUTH DAILY  ? ?No facility-administered encounter medications on file as of 07/13/2021.  ? ? ?Allergies (verified) ?Patient has no known allergies.  ? ?History: ?Past Medical History:  ?Diagnosis Date  ? Allergy   ? no per pt  ? Arthritis   ? Cancer ([facility]   ? prostate  ? Diverticulosis   ? 2008  ? GERD (gastroesophageal reflux disease)   ? Hiatal hernia   ? Hyperlipidemia   ? Prostate cancer ([facility] 1992  ? ?Past Surgical History:  ?Procedure Laterality Date  ? CATARACT EXTRACTION    ? both eyes  ? CHOLECYSTECTOMY    ? COLONOSCOPY    ? TONSILLECTOMY    ? TRANSURETHRAL RESECTION OF PROSTATE    ? UPPER GASTROINTESTINAL ENDOSCOPY    ? ?Family History  ?Problem Relation Age of Onset  ? Cancer  Sister   ?     uterine  ? Colon cancer Mother   ? Hyperlipidemia Other   ? COPD Other   ?     colon  ? Esophageal cancer Neg Hx   ? Rectal cancer Neg Hx   ? Stomach cancer Neg Hx   ? ?Social History  ? ?Socioeconomic History  ? Marital status: Married  ?  Spouse name: Not on file  ? Number of children: Not on file  ? Years of education: Not on file  ? Highest education level: Not on file  ?Occupational History  ? Not on file  ?Tobacco Use  ? Smoking status: Former  ?  Packs/day: 2.00  ?  Years: 3.00  ?  Pack years: 6.00  ?  Types: Cigarettes  ?  Quit date: 03/22/1963  ?  Years since quitting: 58.3  ? Smokeless tobacco: Former  ?Substance and Sexual Activity  ? Alcohol use: Yes  ?  Alcohol/week: 0.0 standard drinks  ?  Comment: rarely  ? Drug use: No  ? Sexual activity: Not on file  ?Other Topics Concern  ? Not on file  ?Social History Narrative  ? Lives with wife, son, and granddaughter in a 2 story home.  1 child.    ? Previously worked as a truck  driver for 30 years.    ? High school education.  ? ?Social Determinants of Health  ? ?Financial Resource Strain: Low Risk   ? Difficulty of Paying Living Expenses: Not hard at all  ?Food Insecurity: No Food Insecurity  ? Worried About Charity fundraiser in the Last Year: Never true  ? Ran Out of Food in the Last Year: Never true  ?Transportation Needs: No Transportation Needs  ? Lack of Transportation (Medical): No  ? Lack of Transportation (Non-Medical): No  ?Physical Activity: Insufficiently Active  ? Days of Exercise per Week: 2 days  ? Minutes of Exercise per Session: 20 min  ?Stress: No Stress Concern Present  ? Feeling of Stress : Not at all  ?Social Connections: Moderately Isolated  ? Frequency of Communication with Friends and Family: Three times a week  ? Frequency of Social Gatherings with Friends and Family: Three times a week  ? Attends Religious Services: Never  ? Active Member of Clubs or Organizations: No  ? Attends Archivist Meetings: Never  ?  Marital Status: Married  ? ? ?Tobacco Counseling ?Counseling given: Not Answered ? ? ?Clinical Intake: ? ?Pre-visit preparation completed: Yes ? ?Pain : No/denies pain ? ?  ? ?Nutritional Risks: None ?Diabetes: No ? ?How often do you need to have someone help you when you read instructions, pamphlets, or other written materials from your doctor or pharmacy?: 1 - Never ?What is the last grade level you completed in school?: High school ? ?Diabetic?no  ? ?Interpreter Needed?: No ? ?Information entered by :: [email or web address] ? ? ?Activities of Daily Living ? ?  07/13/2021  ? 10:24 AM  ?In your present state of health, do you have any difficulty performing the following activities:  ?Hearing? 0  ?Vision? 0  ?Difficulty concentrating or making decisions? 0  ?Walking or climbing stairs? 0  ?Dressing or bathing? 0  ?Doing errands, shopping? 0  ?Preparing Food and eating ? N  ?Using the Toilet? N  ?In the past six months, have you accidently leaked urine? N  ?Do you have problems with loss of bowel control? N  ?Managing your Medications? N  ?Managing your Finances? N  ?Housekeeping or managing your Housekeeping? N  ? ? ?Patient Care Team: ?Libby Maw, MD as PCP - General (Family Medicine) ?Germaine Pomfret, [facility] as Pharmacist (Pharmacist) ? ?Indicate any recent Medical Services you may have received from other than [facility] providers in the past year (date may be approximate). ? ?   ?Assessment:  ? This is a routine wellness examination for Travis Schwartz. ? ?Hearing/Vision screen ?Vision Screening - Comments:: Annual eye exams  ? ?Dietary issues and exercise activities discussed: ?Current Exercise Habits: Home exercise routine, Type of exercise: walking, Time (Minutes): 30, Frequency (Times/Week): 5, Weekly Exercise (Minutes/Week): 150, Intensity: Mild, Exercise limited by: None identified ? ? Goals Addressed   ? ?  ?  ?  ?  ? This Visit's Progress  ?  Increase physical activity   On track  ? ?  ? ?Depression Screen ? ?   07/13/2021  ? 10:22 AM 07/13/2021  ? 10:20 AM 05/31/2021  ?  9:28 AM 12/01/2020  ?  9:58 AM 08/26/2020  ? 10:20 AM 07/07/2020  ? 10:35 AM 05/25/2020  ? 10:14 AM  ?PHQ 2/9 Scores  ?PHQ - 2 Score 0 0 0 0 0 0 0  ?  ?Fall Risk ? ?  07/13/2021  ? 10:22 AM 05/31/2021  ?  9:28  AM 12/01/2020  ?  9:58 AM 08/26/2020  ? 10:21 AM 07/07/2020  ? 10:34 AM  ?Fall Risk   ?Falls in the past year? 0 0 0 0 1  ?Number falls in past yr: 0 0 0  0  ?Injury with Fall? 0  0  0  ?Risk for fall due to :   No Fall Risks  History of fall(s)  ?Follow up Falls evaluation completed  Falls evaluation completed  Falls prevention discussed  ? ? ?FALL RISK PREVENTION PERTAINING TO THE HOME: ? ?Any stairs in or around the home? Yes  ?If so, are there any without handrails? No  ?Home free of loose throw rugs in walkways, pet beds, electrical cords, etc? Yes  ?Adequate lighting in your home to reduce risk of falls? Yes  ? ?ASSISTIVE DEVICES UTILIZED TO PREVENT FALLS: ? ?Life alert? No  ?Use of a cane, walker or w/c? No  ?Grab bars in the bathroom? Yes  ?Shower chair or bench in shower? Yes  ?Elevated toilet seat or a handicapped toilet? Yes  ? ? ?Cognitive Function: ?  ? Normal cognitive status assessed by telephone conversation  by this Nurse Health Advisor. No abnormalities found.  ? ? ?  07/07/2020  ? 10:41 AM  ?6CIT Screen  ?What Year? 0 points  ?What month? 0 points  ?What time? 0 points  ?Count back from 20 0 points  ?Months in reverse 0 points  ?Repeat phrase 0 points  ?Total Score 0 points  ? ? ?Immunizations ?Immunization History  ?Administered Date(s) Administered  ? Fluad Quad(high Dose 65+) 12/31/2018, 01/16/2020, 12/01/2020  ? Influenza Whole 03/21/2004, 12/19/2008  ? Influenza, High Dose Seasonal PF 12/16/2015, 12/19/2016, 12/26/2017  ? Influenza,inj,Quad PF,6+ Mos 01/14/2014, 12/15/2014  ? Influenza,inj,quad, With Preservative 11/19/2016  ? PFIZER Comirnaty(Gray Top)Covid-19 Tri-Sucrose Vaccine 09/15/2020  ? PFIZER(Purple Top)SARS-COV-2 Vaccination  04/25/2019, 05/23/2019, 03/06/2020  ? Pneumococcal Conjugate-13 12/15/2014  ? Pneumococcal Polysaccharide-23 03/21/2005  ? Pneumococcal-Unspecified 11/19/2016  ? Td 03/21/2005  ? Tdap 12/16/2015  ? Zoster, Live 11

## 2021-08-05 IMAGING — DX DG RIBS 2V*R*
3 series · 3 of 3 positions shown · non-contrast
Comparison: 05/18/2005

CLINICAL DATA: Right anterior fifth rib pain after fall several
days ago

EXAM:
RIGHT RIBS - 2 VIEW

[chest pa]
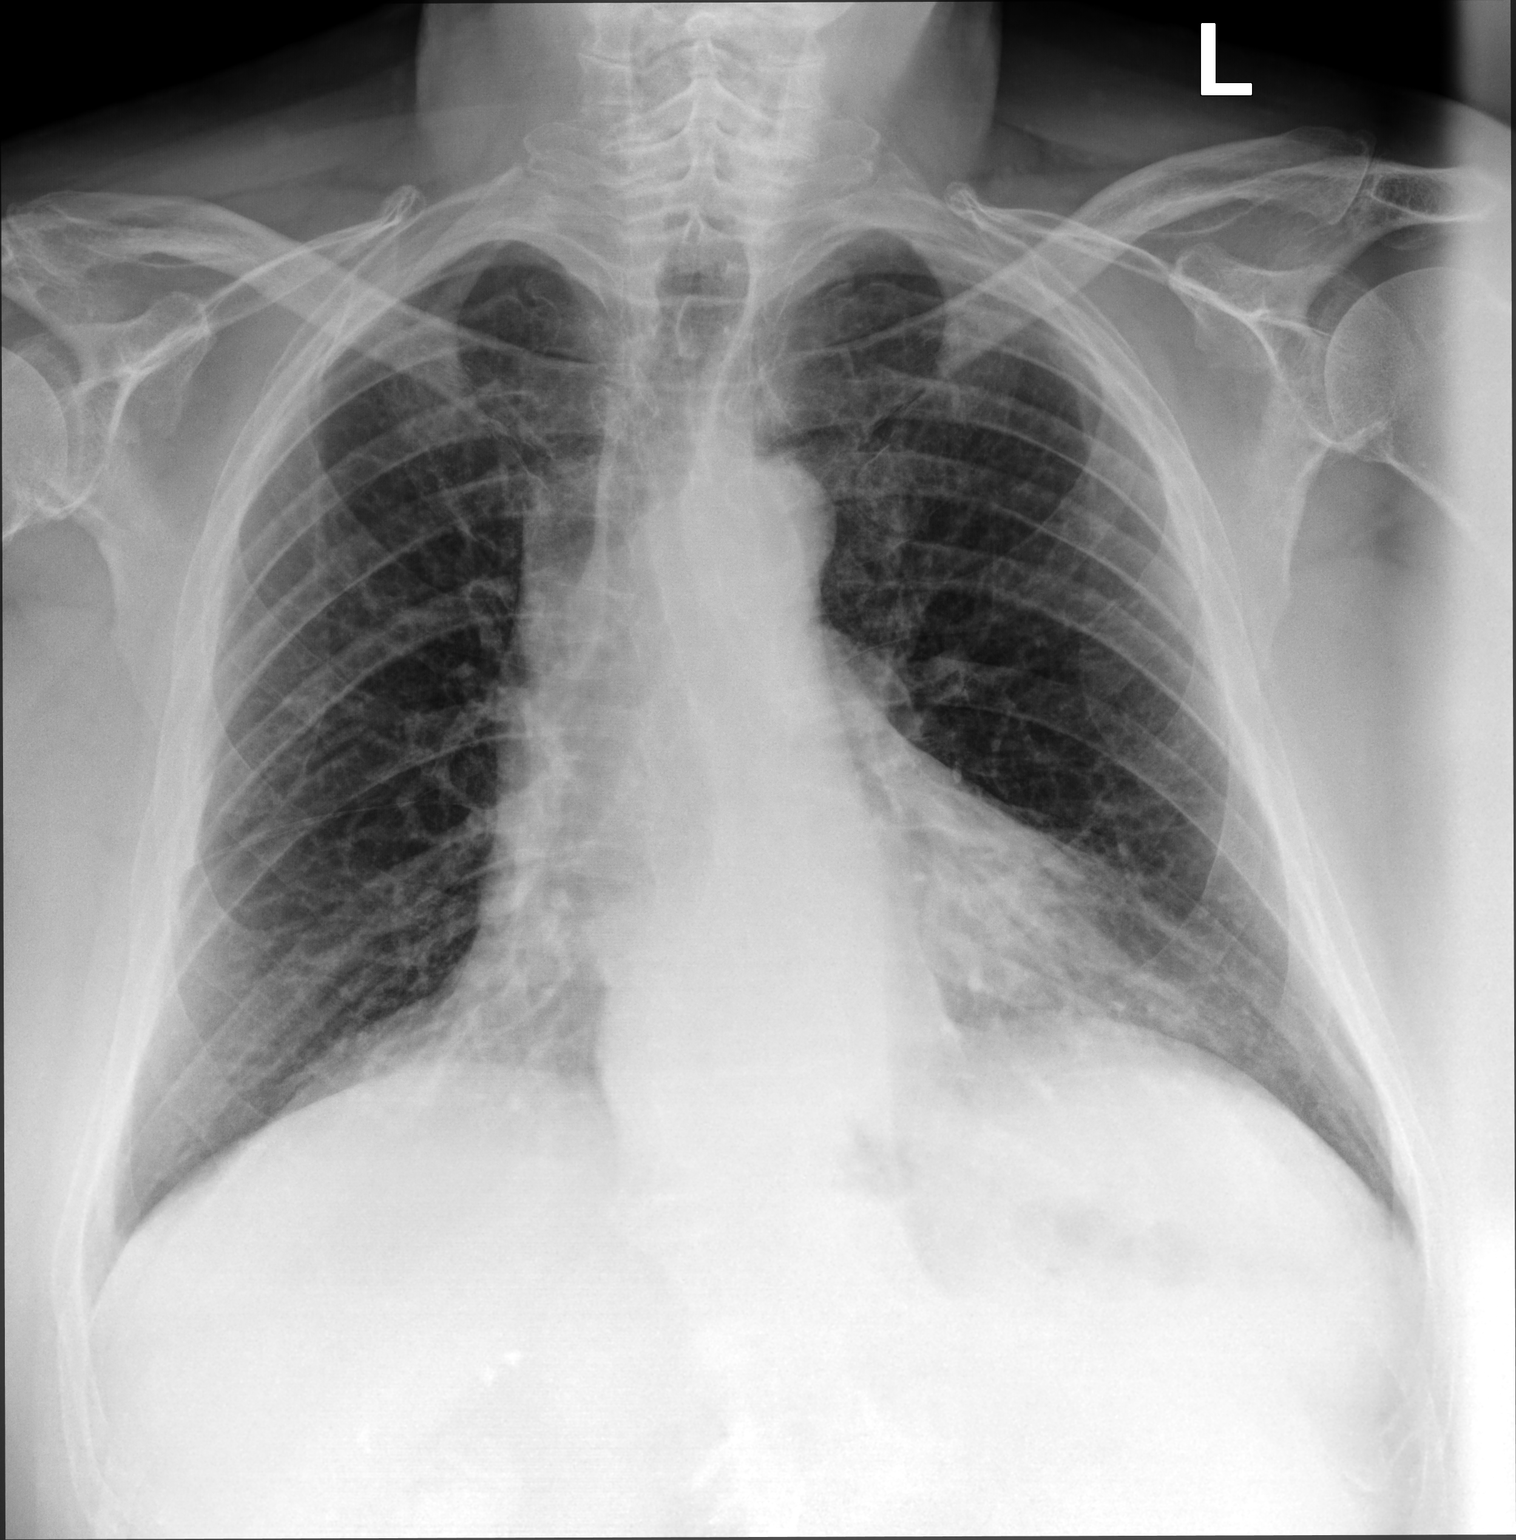

[hemithorax (ribs) ap]
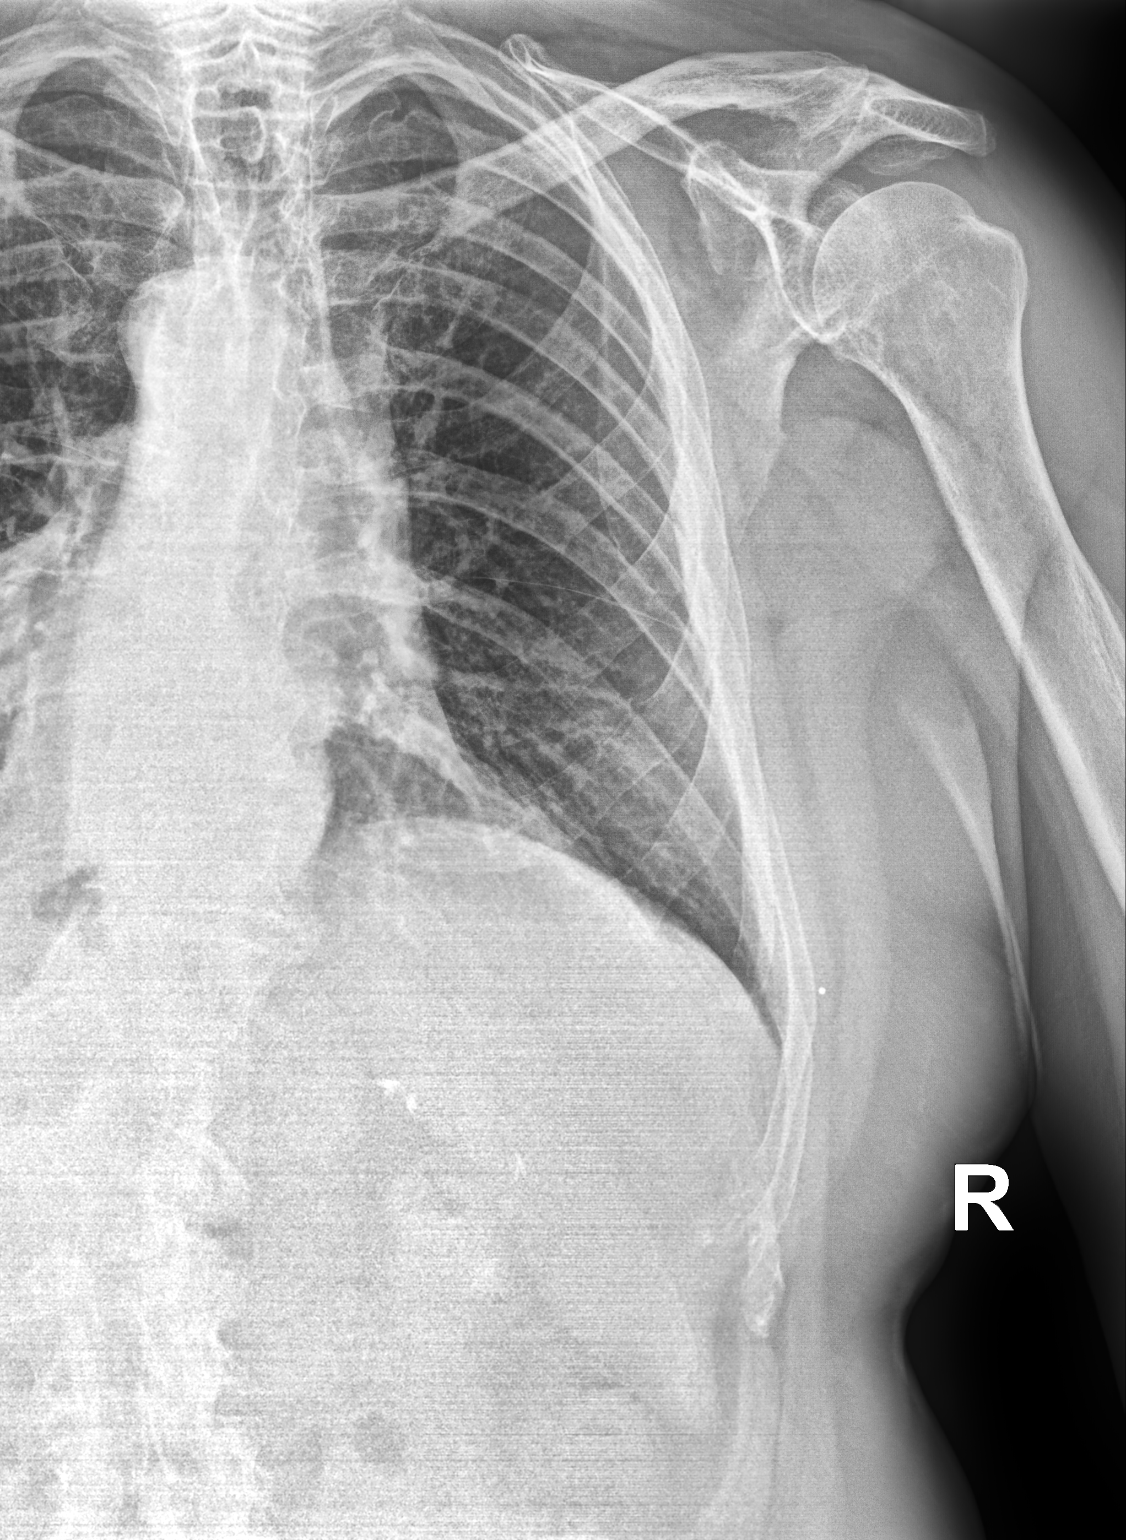

[hemithorax (ribs) mlo]
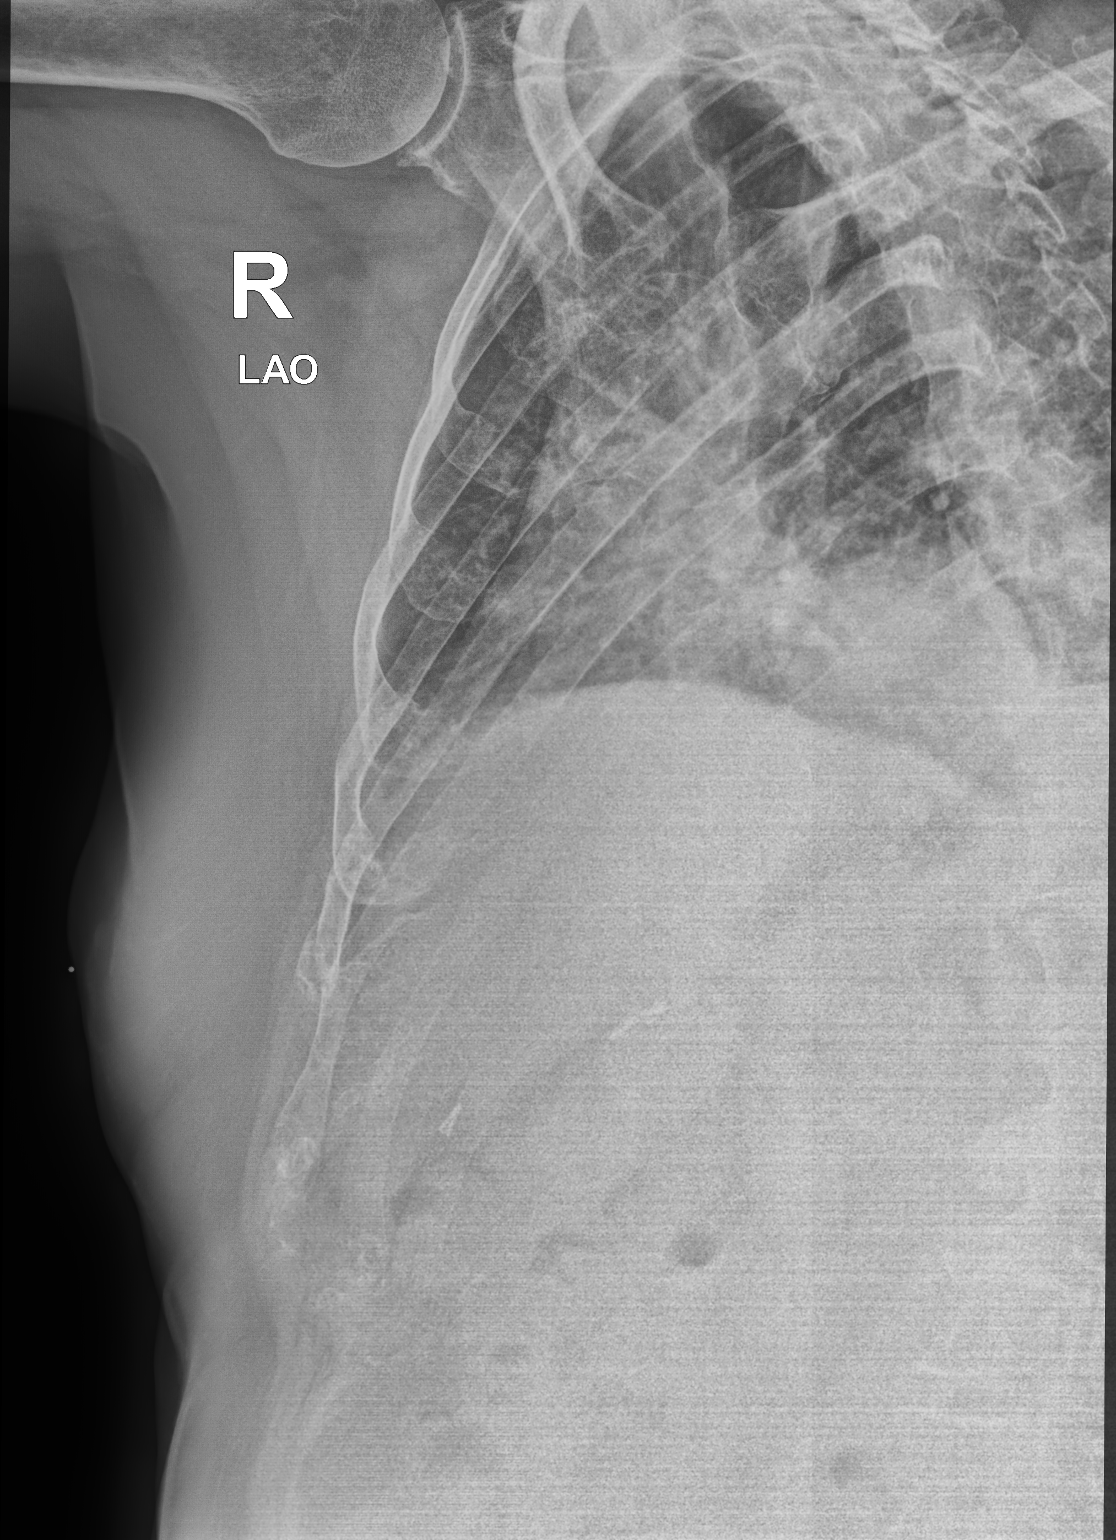

[3 of 3 positions shown; findings below may reference images not displayed]

FINDINGS: Frontal and oblique views of the right thoracic cage are obtained.
There is a minimally displaced fracture of the right anterior sixth
rib at the costochondral junction, only well seen on the oblique
projection. No other acute displaced rib fractures. No airspace
disease, effusion, or pneumothorax. Cardiac silhouette is
unremarkable.
IMPRESSION: 1. Minimally displaced fracture at the right anterior sixth rib
costochondral junction.

## 2021-11-04 DIAGNOSIS — M25561 Pain in right knee: Secondary | ICD-10-CM | POA: Diagnosis not present

## 2021-11-04 DIAGNOSIS — M1711 Unilateral primary osteoarthritis, right knee: Secondary | ICD-10-CM | POA: Diagnosis not present

## 2021-11-14 ENCOUNTER — Other Ambulatory Visit: Payer: Self-pay | Admitting: Family Medicine

## 2021-11-14 DIAGNOSIS — E039 Hypothyroidism, unspecified: Secondary | ICD-10-CM

## 2021-12-01 ENCOUNTER — Ambulatory Visit: Payer: Medicare Other | Admitting: Family Medicine

## 2021-12-02 ENCOUNTER — Encounter: Payer: Self-pay | Admitting: Family Medicine

## 2021-12-02 ENCOUNTER — Ambulatory Visit (INDEPENDENT_AMBULATORY_CARE_PROVIDER_SITE_OTHER): Payer: Medicare Other | Admitting: Family Medicine

## 2021-12-02 VITALS — BP 140/77 | HR 70 | Temp 98.2°F | Ht 68.0 in | Wt 210.0 lb

## 2021-12-02 DIAGNOSIS — Z23 Encounter for immunization: Secondary | ICD-10-CM

## 2021-12-02 DIAGNOSIS — L821 Other seborrheic keratosis: Secondary | ICD-10-CM | POA: Diagnosis not present

## 2021-12-02 DIAGNOSIS — I10 Essential (primary) hypertension: Secondary | ICD-10-CM | POA: Diagnosis not present

## 2021-12-02 LAB — BASIC METABOLIC PANEL
BUN: 18 mg/dL (ref 6–23)
CO2: 29 mEq/L (ref 19–32)
Calcium: 9.5 mg/dL (ref 8.4–10.5)
Chloride: 101 mEq/L (ref 96–112)
Creatinine, Ser: 1.04 mg/dL (ref 0.40–1.50)
GFR: 67.16 mL/min (ref >60.00)
Glucose, Bld: 96 mg/dL (ref 70–99)
Potassium: 4 mEq/L (ref 3.5–5.1)
Sodium: 142 mEq/L (ref 135–145)

## 2021-12-02 NOTE — Progress Notes (Signed)
Established Patient Office Visit  Subjective   Patient ID: Travis Schwartz, male    DOB: [birth year]  Age: 81 y.o. MRN: [id number]  Chief Complaint  Patient presents with  . Follow-up    6 month follow up. No concerns.     HPI routine follow-up of hypertension today.  Seeing orthopedist about pain in right knee.  Some improvement status post injection.  Has increased exercise by walking and been able to lose some weight compliant with his blood pressure medicine.  Careful with sodium intake.  Wife is concerned about some of his moles that he has had over the years.  They have all been stable.    Review of Systems  Constitutional: Negative.   HENT: Negative.    Eyes:  Negative for blurred vision, discharge and redness.  Respiratory: Negative.    Cardiovascular: Negative.   Gastrointestinal:  Negative for abdominal pain.  Genitourinary: Negative.   Musculoskeletal:  Positive for joint pain. Negative for myalgias.  Skin:  Negative for rash.  Neurological:  Negative for tingling, loss of consciousness and weakness.  Endo/Heme/Allergies:  Negative for polydipsia.  Psychiatric/Behavioral:  Negative for depression. The patient is not nervous/anxious.       Objective:     BP (!) 140/77 (BP Location: Right Arm, Patient Position: Sitting, Cuff Size: Normal)   Pulse 70   Temp 98.2 F (36.8 C) (Temporal)   Ht '5\' 8"'$  (1.727 m)   Wt 210 lb (95.3 kg)   SpO2 94%   BMI 31.93 kg/m  BP Readings from Last 3 Encounters:  12/02/21 (!) 140/77  05/31/21 132/80  12/01/20 110/66   Wt Readings from Last 3 Encounters:  12/02/21 210 lb (95.3 kg)  05/31/21 215 lb 9.6 oz (97.8 kg)  12/01/20 211 lb 3.2 oz (95.8 kg)      Physical Exam Constitutional:      General: He is not in acute distress.    Appearance: Normal appearance. He is not ill-appearing, toxic-appearing or diaphoretic.  HENT:     Head: Normocephalic and atraumatic.     Right Ear: External ear normal.     Left Ear: External  ear normal.  Eyes:     General: No scleral icterus.       Right eye: No discharge.        Left eye: No discharge.     Extraocular Movements: Extraocular movements intact.     Conjunctiva/sclera: Conjunctivae normal.  Cardiovascular:     Rate and Rhythm: Normal rate and regular rhythm.  Pulmonary:     Effort: Pulmonary effort is normal. No respiratory distress.     Breath sounds: Normal breath sounds.  Musculoskeletal:     Cervical back: No rigidity or tenderness.  Skin:    General: Skin is warm and dry.       Neurological:     Mental Status: He is alert and oriented to person, place, and time.  Psychiatric:        Mood and Affect: Mood normal.        Behavior: Behavior normal.     No results found for any visits on 12/02/21.    The ASCVD Risk score (Arnett DK, et al., 2019) failed to calculate for the following reasons:   The 2019 ASCVD risk score is only valid for ages 60 to 90    Assessment & Plan:   Problem List Items Addressed This Visit       Cardiovascular and Mediastinum   Essential hypertension -  Primary   Relevant Orders   Basic metabolic panel     Musculoskeletal and Integument   Seborrheic keratosis     Other   Need for influenza vaccination   Relevant Orders   Flu vaccine HIGH DOSE PF (Fluzone High dose)    Return in about 6 months (around 06/02/2022), or if symptoms worsen or fail to improve.  Patient will continue to exercise and strive for weight loss.  Reminded him to be mindful about sodium in his diet.  And for patient was given on managing hypertension as well as seborrheic keratoses.  Could consider Derm referral for second opinion.  Blood pressure has been well controlled.  He will check and record his blood pressure periodically.  Libby Maw, MD

## 2021-12-07 ENCOUNTER — Other Ambulatory Visit: Payer: Self-pay | Admitting: Family Medicine

## 2021-12-07 DIAGNOSIS — Z0000 Encounter for general adult medical examination without abnormal findings: Secondary | ICD-10-CM

## 2021-12-09 DIAGNOSIS — M1711 Unilateral primary osteoarthritis, right knee: Secondary | ICD-10-CM | POA: Diagnosis not present

## 2021-12-21 DIAGNOSIS — M25561 Pain in right knee: Secondary | ICD-10-CM | POA: Diagnosis not present

## 2022-01-03 ENCOUNTER — Encounter: Payer: Self-pay | Admitting: Family Medicine

## 2022-01-04 DIAGNOSIS — S83241D Other tear of medial meniscus, current injury, right knee, subsequent encounter: Secondary | ICD-10-CM | POA: Diagnosis not present

## 2022-01-12 DIAGNOSIS — M546 Pain in thoracic spine: Secondary | ICD-10-CM | POA: Diagnosis not present

## 2022-01-12 DIAGNOSIS — M62830 Muscle spasm of back: Secondary | ICD-10-CM | POA: Diagnosis not present

## 2022-01-12 DIAGNOSIS — M79605 Pain in left leg: Secondary | ICD-10-CM | POA: Diagnosis not present

## 2022-01-12 DIAGNOSIS — M5417 Radiculopathy, lumbosacral region: Secondary | ICD-10-CM | POA: Diagnosis not present

## 2022-01-12 DIAGNOSIS — M9903 Segmental and somatic dysfunction of lumbar region: Secondary | ICD-10-CM | POA: Diagnosis not present

## 2022-01-12 DIAGNOSIS — M9902 Segmental and somatic dysfunction of thoracic region: Secondary | ICD-10-CM | POA: Diagnosis not present

## 2022-01-12 DIAGNOSIS — M79604 Pain in right leg: Secondary | ICD-10-CM | POA: Diagnosis not present

## 2022-01-14 DIAGNOSIS — M546 Pain in thoracic spine: Secondary | ICD-10-CM | POA: Diagnosis not present

## 2022-01-14 DIAGNOSIS — M62830 Muscle spasm of back: Secondary | ICD-10-CM | POA: Diagnosis not present

## 2022-01-14 DIAGNOSIS — M9902 Segmental and somatic dysfunction of thoracic region: Secondary | ICD-10-CM | POA: Diagnosis not present

## 2022-01-14 DIAGNOSIS — M9903 Segmental and somatic dysfunction of lumbar region: Secondary | ICD-10-CM | POA: Diagnosis not present

## 2022-01-14 DIAGNOSIS — M79605 Pain in left leg: Secondary | ICD-10-CM | POA: Diagnosis not present

## 2022-01-14 DIAGNOSIS — M79604 Pain in right leg: Secondary | ICD-10-CM | POA: Diagnosis not present

## 2022-01-14 DIAGNOSIS — M5417 Radiculopathy, lumbosacral region: Secondary | ICD-10-CM | POA: Diagnosis not present

## 2022-01-26 ENCOUNTER — Other Ambulatory Visit: Payer: Self-pay | Admitting: Sports Medicine

## 2022-01-26 DIAGNOSIS — M546 Pain in thoracic spine: Secondary | ICD-10-CM | POA: Diagnosis not present

## 2022-01-31 DIAGNOSIS — M9902 Segmental and somatic dysfunction of thoracic region: Secondary | ICD-10-CM | POA: Diagnosis not present

## 2022-01-31 DIAGNOSIS — M9903 Segmental and somatic dysfunction of lumbar region: Secondary | ICD-10-CM | POA: Diagnosis not present

## 2022-01-31 DIAGNOSIS — M79604 Pain in right leg: Secondary | ICD-10-CM | POA: Diagnosis not present

## 2022-01-31 DIAGNOSIS — M62830 Muscle spasm of back: Secondary | ICD-10-CM | POA: Diagnosis not present

## 2022-01-31 DIAGNOSIS — M546 Pain in thoracic spine: Secondary | ICD-10-CM | POA: Diagnosis not present

## 2022-01-31 DIAGNOSIS — M79605 Pain in left leg: Secondary | ICD-10-CM | POA: Diagnosis not present

## 2022-01-31 DIAGNOSIS — M5417 Radiculopathy, lumbosacral region: Secondary | ICD-10-CM | POA: Diagnosis not present

## 2022-02-21 ENCOUNTER — Other Ambulatory Visit: Payer: Self-pay | Admitting: Family Medicine

## 2022-02-21 ENCOUNTER — Telehealth: Payer: Self-pay | Admitting: Family Medicine

## 2022-02-21 DIAGNOSIS — I10 Essential (primary) hypertension: Secondary | ICD-10-CM

## 2022-02-21 DIAGNOSIS — E039 Hypothyroidism, unspecified: Secondary | ICD-10-CM

## 2022-02-21 MED ORDER — LEVOTHYROXINE SODIUM 75 MCG PO TABS: ORAL_TABLET | ORAL | 0 refills | Status: DC

## 2022-02-21 NOTE — Telephone Encounter (Signed)
Caller Name: Markee Matera Call back phone #: [phone]   MEDICATION(S):  Levothyroxine  Days of Med Remaining:   Has the patient contacted their pharmacy (YES/NO)? yes What did pharmacy advise?   Preferred Pharmacy:  [id number] - [place], [place] - [address] AT [address]   Phone:[phone]:[phone]  ~~~Please advise patient/caregiver to allow 2-3 business days to process RX refills.

## 2022-03-03 ENCOUNTER — Ambulatory Visit
Admission: RE | Admit: 2022-03-03 | Discharge: 2022-03-03 | Disposition: A | Discharge status: Home / Self Care | Payer: Medicare Other | Source: Ambulatory Visit | Attending: Sports Medicine | Admitting: Sports Medicine

## 2022-03-03 DIAGNOSIS — M546 Pain in thoracic spine: Secondary | ICD-10-CM

## 2022-03-10 DIAGNOSIS — S22070A Wedge compression fracture of T9-T10 vertebra, initial encounter for closed fracture: Secondary | ICD-10-CM | POA: Diagnosis not present

## 2022-03-10 DIAGNOSIS — S22050D Wedge compression fracture of T5-T6 vertebra, subsequent encounter for fracture with routine healing: Secondary | ICD-10-CM | POA: Diagnosis not present

## 2022-03-10 DIAGNOSIS — M546 Pain in thoracic spine: Secondary | ICD-10-CM | POA: Diagnosis not present

## 2022-03-10 DIAGNOSIS — S22060D Wedge compression fracture of T7-T8 vertebra, subsequent encounter for fracture with routine healing: Secondary | ICD-10-CM | POA: Diagnosis not present

## 2022-03-30 DIAGNOSIS — M546 Pain in thoracic spine: Secondary | ICD-10-CM | POA: Diagnosis not present

## 2022-04-07 DIAGNOSIS — M546 Pain in thoracic spine: Secondary | ICD-10-CM | POA: Diagnosis not present

## 2022-04-13 ENCOUNTER — Telehealth: Payer: Self-pay | Admitting: Family Medicine

## 2022-04-13 DIAGNOSIS — M4854XA Collapsed vertebra, not elsewhere classified, thoracic region, initial encounter for fracture: Secondary | ICD-10-CM | POA: Diagnosis not present

## 2022-04-13 NOTE — Telephone Encounter (Signed)
Type of forms received:[facility] [employer]  Routed ZO:XWRU kremer  Paperwork received by : terrill   Individual made aware of 3-5 business day turn around (Y/N): yes Form completed and patient made aware of charges(Y/N): yes  Faxed to :   Form location:  dr Ethelene Hal box

## 2022-04-21 ENCOUNTER — Encounter: Payer: Self-pay | Admitting: Family Medicine

## 2022-04-21 NOTE — Telephone Encounter (Signed)
Pt is needing a cb concerning his form from [place]. Pt's # at [phone] [facility])

## 2022-04-22 NOTE — Telephone Encounter (Signed)
Form signed and faxed to requested number. Patient aware

## 2022-05-13 DIAGNOSIS — M8008XA Age-related osteoporosis with current pathological fracture, vertebra(e), initial encounter for fracture: Secondary | ICD-10-CM | POA: Diagnosis not present

## 2022-05-15 ENCOUNTER — Other Ambulatory Visit: Payer: Self-pay

## 2022-05-15 ENCOUNTER — Encounter (HOSPITAL_BASED_OUTPATIENT_CLINIC_OR_DEPARTMENT_OTHER): Payer: Self-pay | Admitting: Emergency Medicine

## 2022-05-15 ENCOUNTER — Emergency Department (HOSPITAL_BASED_OUTPATIENT_CLINIC_OR_DEPARTMENT_OTHER): Payer: Medicare Other

## 2022-05-15 ENCOUNTER — Emergency Department (HOSPITAL_BASED_OUTPATIENT_CLINIC_OR_DEPARTMENT_OTHER)
Admission: EM | Admit: 2022-05-15 | Discharge: 2022-05-15 | Disposition: A | Discharge status: Home / Self Care | Payer: Medicare Other | Attending: Emergency Medicine | Admitting: Emergency Medicine

## 2022-05-15 DIAGNOSIS — R1084 Generalized abdominal pain: Secondary | ICD-10-CM | POA: Insufficient documentation

## 2022-05-15 DIAGNOSIS — Z8546 Personal history of malignant neoplasm of prostate: Secondary | ICD-10-CM | POA: Insufficient documentation

## 2022-05-15 DIAGNOSIS — I700 Atherosclerosis of aorta: Secondary | ICD-10-CM | POA: Diagnosis not present

## 2022-05-15 DIAGNOSIS — R109 Unspecified abdominal pain: Secondary | ICD-10-CM | POA: Diagnosis not present

## 2022-05-15 LAB — COMPREHENSIVE METABOLIC PANEL
ALT: 31 U/L (ref 0–44)
AST: 34 U/L (ref 15–41)
Albumin: 4.5 g/dL (ref 3.5–5.0)
Alkaline Phosphatase: 91 U/L (ref 38–126)
Anion gap: 10 (ref 5–15)
BUN: 21 mg/dL (ref 8–23)
CO2: 29 mmol/L (ref 22–32)
Calcium: 8.9 mg/dL (ref 8.9–10.3)
Chloride: 100 mmol/L (ref 98–111)
Creatinine, Ser: 0.98 mg/dL (ref 0.61–1.24)
GFR, Estimated: >60 mL/min (ref >60)
Glucose, Bld: 103 mg/dL — ABNORMAL HIGH (ref 70–99)
Potassium: 3.5 mmol/L (ref 3.5–5.1)
Sodium: 139 mmol/L (ref 135–145)
Total Bilirubin: 1.4 mg/dL — ABNORMAL HIGH (ref 0.3–1.2)
Total Protein: 7.4 g/dL (ref 6.5–8.1)

## 2022-05-15 LAB — CBC WITH DIFFERENTIAL/PLATELET
Abs Immature Granulocytes: 0.04 10*3/uL (ref 0.00–0.07)
Basophils Absolute: 0 10*3/uL (ref 0.0–0.1)
Basophils Relative: 0 %
Eosinophils Absolute: 0.1 10*3/uL (ref 0.0–0.5)
Eosinophils Relative: 1 %
HCT: 39.6 % (ref 39.0–52.0)
Hemoglobin: 13.8 g/dL (ref 13.0–17.0)
Immature Granulocytes: 0 %
Lymphocytes Relative: 14 %
Lymphs Abs: 1.6 10*3/uL (ref 0.7–4.0)
MCH: 34 pg (ref 26.0–34.0)
MCHC: 34.8 g/dL (ref 30.0–36.0)
MCV: 97.5 fL (ref 80.0–100.0)
Monocytes Absolute: 0.5 10*3/uL (ref 0.1–1.0)
Monocytes Relative: 4 %
Neutro Abs: 9.4 10*3/uL — ABNORMAL HIGH (ref 1.7–7.7)
Neutrophils Relative %: 81 %
Platelets: 388 10*3/uL (ref 150–400)
RBC: 4.06 MIL/uL — ABNORMAL LOW (ref 4.22–5.81)
RDW: 15.7 % — ABNORMAL HIGH (ref 11.5–15.5)
WBC: 11.7 10*3/uL — ABNORMAL HIGH (ref 4.0–10.5)
nRBC: 0 % (ref 0.0–0.2)

## 2022-05-15 LAB — LIPASE, BLOOD: Lipase: 24 U/L (ref 11–51)

## 2022-05-15 MED ORDER — IOHEXOL 300 MG/ML  SOLN
100.0000 mL | Freq: Once | INTRAMUSCULAR | Status: AC | PRN
  Administered 2022-05-15: 100 mL via INTRAVENOUS

## 2022-05-15 MED ORDER — SODIUM CHLORIDE 0.9 % IV BOLUS
1000.0000 mL | Freq: Once | INTRAVENOUS | Status: AC
  Administered 2022-05-15: 1000 mL via INTRAVENOUS

## 2022-05-15 NOTE — ED Provider Notes (Signed)
[place] [facility] EMERGENCY DEPARTMENT AT [facility] [place] Provider Note   CSN: [id number] Arrival date & time: 05/15/22  [id number]     History  Chief Complaint  Patient presents with   Abdominal Pain    Travis Schwartz is a 82 y.o. male.  Patient here with abdominal pain.  History of prostate cancer does not appear to be undergoing active treatment, high cholesterol, acid reflux, recent compression fractures.  The started on narcotics a couple days ago for this.  Not on blood thinners.  No new falls.  Possibly constipation.  No nausea or vomiting.  No pain with urination.  Denies any chest pain or shortness of breath.  He has had history of cholecystectomy and resection of prostate in the past.  Diffuse pain in his abdomen for the last few days.  Denies any fevers or chills.  The history is provided by the patient.       Home Medications Prior to Admission medications   Medication Sig Start Date End Date Taking? Authorizing Provider  acetaminophen (TYLENOL 8 HOUR) 650 MG CR tablet     [provider]  atorvastatin (LIPITOR) 40 MG tablet TAKE 1 TABLET BY MOUTH EVERY DAY 06/28/21   Libby Maw, MD  hydrochlorothiazide (HYDRODIURIL) 25 MG tablet TAKE 1 TABLET(25 MG) BY MOUTH DAILY 02/21/22   Libby Maw, MD  levothyroxine (SYNTHROID) 75 MCG tablet TAKE 1 TABLET BY MOUTH BEFORE BREAKFAST 02/21/22   Libby Maw, MD  Multiple Vitamins-Minerals (CENTRUM SILVER 50+MEN PO) Take 1 tablet by mouth daily.    [provider]  naproxen sodium (ALEVE) 220 MG tablet Take 220 mg by mouth daily.    [provider]  omeprazole (PRILOSEC) 20 MG capsule TAKE 1 CAPSULE BY MOUTH DAILY 12/07/21   Libby Maw, MD      Allergies    Patient has no known allergies.    Review of Systems   Review of Systems  Physical Exam Updated Vital Signs BP (!) 147/107 (BP Location: Right Arm)   Pulse 64   Temp 97.6 F (36.4 C) (Oral)   Resp 16   Ht  '5\' 9"'$  (1.753 m)   Wt 93 kg   SpO2 100%   BMI 30.27 kg/m  Physical Exam Vitals and nursing note reviewed.  Constitutional:      General: He is not in acute distress.    Appearance: He is well-developed.  HENT:     Head: Normocephalic and atraumatic.     Nose: Nose normal.     Mouth/Throat:     Mouth: Mucous membranes are moist.  Eyes:     Extraocular Movements: Extraocular movements intact.     Conjunctiva/sclera: Conjunctivae normal.     Pupils: Pupils are equal, round, and reactive to light.  Cardiovascular:     Rate and Rhythm: Normal rate and regular rhythm.     Heart sounds: No murmur heard. Pulmonary:     Effort: Pulmonary effort is normal. No respiratory distress.     Breath sounds: Normal breath sounds.  Abdominal:     Palpations: Abdomen is soft.     Tenderness: There is abdominal tenderness.  Musculoskeletal:        General: No swelling.     Cervical back: Neck supple.  Skin:    General: Skin is warm and dry.     Capillary Refill: Capillary refill takes less than 2 seconds.  Neurological:     Mental Status: He is alert.  Psychiatric:  Mood and Affect: Mood normal.     ED Results / Procedures / Treatments   Labs (all labs ordered are listed, but only abnormal results are displayed) Labs Reviewed  CBC WITH DIFFERENTIAL/PLATELET - Abnormal; Notable for the following components:      Result Value   WBC 11.7 (*)    RBC 4.06 (*)    RDW 15.7 (*)    Neutro Abs 9.4 (*)    All other components within normal limits  COMPREHENSIVE METABOLIC PANEL - Abnormal; Notable for the following components:   Glucose, Bld 103 (*)    Total Bilirubin 1.4 (*)    All other components within normal limits  LIPASE, BLOOD    EKG None  Radiology CT ABDOMEN PELVIS W CONTRAST  Result Date: 05/15/2022 CLINICAL DATA:  Left-sided abdominal pain for 3 days. Prostate carcinoma. * Tracking Code: BO * EXAM: CT ABDOMEN AND PELVIS WITH CONTRAST TECHNIQUE: Multidetector CT imaging  of the abdomen and pelvis was performed using the standard protocol following bolus administration of intravenous contrast. RADIATION DOSE REDUCTION: This exam was performed according to the departmental dose-optimization program which includes automated exposure control, adjustment of the mA and/or kV according to patient size and/or use of iterative reconstruction technique. CONTRAST:  172m OMNIPAQUE IOHEXOL 300 MG/ML  SOLN COMPARISON:  None Available. FINDINGS: Lower Chest: No acute findings. Hepatobiliary: No hepatic masses identified. Prior cholecystectomy. No evidence of biliary obstruction. Pancreas:  No mass or inflammatory changes. Spleen: Within normal limits in size and appearance. Adrenals/Urinary Tract: No suspicious masses identified. No evidence of ureteral calculi or hydronephrosis. Stomach/Bowel: Moderate to large hiatal hernia is seen. No evidence of obstruction, inflammatory process or abnormal fluid collections. Normal appendix visualized. Diverticulosis is seen mainly involving the sigmoid colon, however there is no evidence of diverticulitis. Vascular/Lymphatic: No pathologically enlarged lymph nodes. No acute vascular findings. Aortic atherosclerotic calcification incidentally noted. Reproductive: Prior prostatectomy noted. No recurrent mass or other significant abnormality identified. Other:  None. Musculoskeletal: No suspicious bone lesions identified. Prior T8 vertebroplasty noted. Old compression deformity of T10 vertebral body also noted. IMPRESSION: No acute findings. Moderate to large hiatal hernia. Colonic diverticulosis, without radiographic evidence of diverticulitis. Prior prostatectomy. No evidence of recurrent or metastatic carcinoma. Aortic Atherosclerosis (ICD10-I70.0). Electronically Signed   By: JMarlaine HindM.D.   On: 05/15/2022 09:09    Procedures Procedures    Medications Ordered in ED Medications  sodium chloride 0.9 % bolus 1,000 mL (1,000 mLs Intravenous New  Bag/Given 05/15/22 0807)  iohexol (OMNIPAQUE) 300 MG/ML solution 100 mL (100 mLs Intravenous Contrast Given 05/15/22 [id number]    ED Course/ Medical Decision Making/ A&P                             Medical Decision Making Amount and/or Complexity of Data Reviewed Labs: ordered. Radiology: ordered.  Risk Prescription drug management.   JClarice Poleis here with abdominal pain.  Normal vitals.  No fever.  Comorbidities of high cholesterol, prior history of prostate cancer.  Has had gallbladder removed in the past.  Differential diagnosis is possibly constipation versus bowel obstruction versus less likely diverticulitis, pancreatitis, UTI.  Will get CBC, CMP, lipase, urinalysis, CT scan abdomen pelvis.  Per my review and interpretation of labs no significant anemia or electrolyte abnormality or kidney injury or leukocytosis.  CT scan per radiology reports unremarkable.  My suspicion is that this could be constipation/gas related pain.  Could be expected postop  discomfort as well.  He looks very well.  Normal vitals.  Have no concern for infectious process.  Recommend MiraLAX.  Discharged in good condition.  Recommend follow-up with primary care and surgeon.  This chart was dictated using voice recognition software.  Despite best efforts to proofread,  errors can occur which can change the documentation meaning.         Final Clinical Impression(s) / ED Diagnoses Final diagnoses:  Generalized abdominal pain    Rx / DC Orders ED Discharge Orders     None         Lennice Sites, DO 05/15/22 [phone]

## 2022-05-15 NOTE — ED Triage Notes (Signed)
Pt c/o left sided abdominal pain that radiates around to side onset Friday. Pt had a recent procedure to his back. Pt denies N/V.

## 2022-05-15 NOTE — Discharge Instructions (Signed)
Workup today was unremarkable.  Recommend using MiraLAX strongly encouraged to be aggressive with the use of MiraLAX to have bowel movements as I suspect this could help her discomfort.  Otherwise this could be some postop pain that I would expect to be normal.  Follow-up with your primary care doctor and your surgeon.

## 2022-05-20 DIAGNOSIS — Z4889 Encounter for other specified surgical aftercare: Secondary | ICD-10-CM | POA: Diagnosis not present

## 2022-05-22 ENCOUNTER — Other Ambulatory Visit: Payer: Self-pay | Admitting: Family Medicine

## 2022-05-22 DIAGNOSIS — E039 Hypothyroidism, unspecified: Secondary | ICD-10-CM

## 2022-05-25 ENCOUNTER — Other Ambulatory Visit: Payer: Self-pay | Admitting: Family Medicine

## 2022-05-25 DIAGNOSIS — E039 Hypothyroidism, unspecified: Secondary | ICD-10-CM

## 2022-06-01 ENCOUNTER — Other Ambulatory Visit: Payer: Self-pay | Admitting: Family Medicine

## 2022-06-01 DIAGNOSIS — E785 Hyperlipidemia, unspecified: Secondary | ICD-10-CM

## 2022-06-01 DIAGNOSIS — Z0000 Encounter for general adult medical examination without abnormal findings: Secondary | ICD-10-CM

## 2022-06-02 ENCOUNTER — Ambulatory Visit (INDEPENDENT_AMBULATORY_CARE_PROVIDER_SITE_OTHER): Payer: Medicare Other | Admitting: Family Medicine

## 2022-06-02 ENCOUNTER — Encounter: Payer: Self-pay | Admitting: Family Medicine

## 2022-06-02 VITALS — BP 124/68 | HR 69 | Temp 97.4°F | Ht 69.0 in | Wt 200.0 lb

## 2022-06-02 DIAGNOSIS — I10 Essential (primary) hypertension: Secondary | ICD-10-CM | POA: Diagnosis not present

## 2022-06-02 DIAGNOSIS — I459 Conduction disorder, unspecified: Secondary | ICD-10-CM

## 2022-06-02 DIAGNOSIS — E039 Hypothyroidism, unspecified: Secondary | ICD-10-CM

## 2022-06-02 DIAGNOSIS — F439 Reaction to severe stress, unspecified: Secondary | ICD-10-CM

## 2022-06-02 DIAGNOSIS — I499 Cardiac arrhythmia, unspecified: Secondary | ICD-10-CM | POA: Insufficient documentation

## 2022-06-02 DIAGNOSIS — E785 Hyperlipidemia, unspecified: Secondary | ICD-10-CM | POA: Diagnosis not present

## 2022-06-02 DIAGNOSIS — S22060D Wedge compression fracture of T7-T8 vertebra, subsequent encounter for fracture with routine healing: Secondary | ICD-10-CM

## 2022-06-02 LAB — URINALYSIS, ROUTINE W REFLEX MICROSCOPIC
Hgb urine dipstick: NEGATIVE
Leukocytes,Ua: NEGATIVE
Nitrite: NEGATIVE
RBC / HPF: NONE SEEN (ref 0–?)
Specific Gravity, Urine: 1.025 (ref 1.000–1.030)
Urine Glucose: NEGATIVE
Urobilinogen, UA: 0.2 (ref 0.0–1.0)
pH: 5.5 (ref 5.0–8.0)

## 2022-06-02 LAB — LIPID PANEL
Cholesterol: 144 mg/dL (ref 0–200)
HDL: 41.5 mg/dL (ref >39.00)
NonHDL: 102.84
Total CHOL/HDL Ratio: 3
Triglycerides: 260 mg/dL — ABNORMAL HIGH (ref 0.0–149.0)
VLDL: 52 mg/dL — ABNORMAL HIGH (ref 0.0–40.0)

## 2022-06-02 LAB — TSH: TSH: 2.56 u[IU]/mL (ref 0.35–5.50)

## 2022-06-02 LAB — LDL CHOLESTEROL, DIRECT: Direct LDL: 63 mg/dL

## 2022-06-02 MED ORDER — BUSPIRONE HCL 5 MG PO TABS: 5.0000 mg | ORAL_TABLET | Freq: Two times a day (BID) | ORAL | 0 refills | Status: AC | PRN

## 2022-06-02 NOTE — Addendum Note (Signed)
Addended by: Abelino Derrick A on: 06/02/2022 11:11 AM   Modules accepted: Orders

## 2022-06-02 NOTE — Progress Notes (Addendum)
Established Patient Office Visit   Subjective:  Patient ID: Travis Schwartz, male    DOB: Aug 12, 1940  Age: 82 y.o. MRN: [id number]  Chief Complaint  Patient presents with   Medical Management of Chronic Issues    6 month follow up pt states that he feels stressed at home with sons recent injury.     HPI Encounter Diagnoses  Name Primary?   Stress at home Yes   Acquired hypothyroidism    Essential hypertension    Hyperlipidemia, unspecified hyperlipidemia type    Skipped beats    Closed wedge compression fracture of T8 vertebra with routine healing, subsequent encounter    For follow-up of above.  Continues current medications.  Status post recent kyphoplasty for compression fractures in his lumbar spine back continues to bother him.  Continues to work with physical therapy.  There is stress at home.  Patient's adult son has been living with him.  He has been out of work over the last 10 years secondary to chronic back issues. Son fell in the kitchen recently and is now paraplegic.  Patient is his primary caregiver.  Treatment Travis Schwartz needs an EKG   Review of Systems  Constitutional: Negative.   HENT: Negative.    Eyes:  Negative for blurred vision, discharge and redness.  Respiratory: Negative.  Negative for shortness of breath.   Cardiovascular: Negative.  Negative for chest pain and palpitations.  Gastrointestinal:  Negative for abdominal pain.  Genitourinary: Negative.   Musculoskeletal:  Positive for back pain. Negative for myalgias.  Skin:  Negative for rash.  Neurological:  Negative for tingling, loss of consciousness and weakness.  Endo/Heme/Allergies:  Negative for polydipsia.  Psychiatric/Behavioral:  The patient is nervous/anxious.      Current Outpatient Medications:    acetaminophen (TYLENOL 8 HOUR) 650 MG CR tablet, , Disp: , Rfl:    atorvastatin (LIPITOR) 40 MG tablet, TAKE 1 TABLET BY MOUTH EVERY DAY, Disp: 90 tablet, Rfl: 3   busPIRone (BUSPAR) 5 MG  tablet, Take 1 tablet (5 mg total) by mouth 2 (two) times daily as needed., Disp: 30 tablet, Rfl: 0   hydrochlorothiazide (HYDRODIURIL) 25 MG tablet, TAKE 1 TABLET(25 MG) BY MOUTH DAILY, Disp: 90 tablet, Rfl: 3   levothyroxine (SYNTHROID) 75 MCG tablet, TAKE 1 TABLET BY MOUTH BEFORE BREAKFAST, Disp: 90 tablet, Rfl: 0   Multiple Vitamins-Minerals (CENTRUM SILVER 50+MEN PO), Take 1 tablet by mouth daily., Disp: , Rfl:    naproxen sodium (ALEVE) 220 MG tablet, Take 220 mg by mouth daily., Disp: , Rfl:    omeprazole (PRILOSEC) 20 MG capsule, TAKE 1 CAPSULE BY MOUTH DAILY, Disp: 90 capsule, Rfl: 1   Objective:     BP 124/68 (BP Location: Right Arm, Patient Position: Sitting, Cuff Size: Large)   Pulse 69   Temp (!) 97.4 F (36.3 C) (Temporal)   Ht 5\' 9"  (1.753 m)   Wt 200 lb (90.7 kg)   SpO2 96%   BMI 29.53 kg/m  BP Readings from Last 3 Encounters:  06/02/22 124/68  05/15/22 133/73  12/02/21 (!) 140/77   Wt Readings from Last 3 Encounters:  06/02/22 200 lb (90.7 kg)  05/15/22 205 lb (93 kg)  12/02/21 210 lb (95.3 kg)      Physical Exam Constitutional:      General: He is not in acute distress.    Appearance: Normal appearance. He is not ill-appearing, toxic-appearing or diaphoretic.  HENT:     Head: Normocephalic and atraumatic.  Right Ear: External ear normal.     Left Ear: External ear normal.     Mouth/Throat:     Mouth: Mucous membranes are moist.     Pharynx: Oropharynx is clear. No oropharyngeal exudate or posterior oropharyngeal erythema.  Eyes:     General: No scleral icterus.       Right eye: No discharge.        Left eye: No discharge.     Extraocular Movements: Extraocular movements intact.     Conjunctiva/sclera: Conjunctivae normal.     Pupils: Pupils are equal, round, and reactive to light.  Cardiovascular:     Rate and Rhythm: Normal rate.     Comments: Occasional skipped beats. Pulmonary:     Effort: Pulmonary effort is normal. No respiratory  distress.     Breath sounds: Normal breath sounds.  Abdominal:     General: Bowel sounds are normal.     Tenderness: There is no abdominal tenderness. There is no guarding.  Musculoskeletal:     Cervical back: No rigidity or tenderness.  Skin:    General: Skin is warm and dry.  Neurological:     Mental Status: He is alert and oriented to person, place, and time.  Psychiatric:        Mood and Affect: Mood normal.        Behavior: Behavior normal.      Results for orders placed or performed in visit on 06/02/22  Lipid panel  Result Value Ref Range   Cholesterol 144 0 - 200 mg/dL   Triglycerides 260.0 (H) 0.0 - 149.0 mg/dL   HDL 41.50 >39.00 mg/dL   VLDL 52.0 (H) 0.0 - 40.0 mg/dL   Total CHOL/HDL Ratio 3    NonHDL 102.84   TSH  Result Value Ref Range   TSH 2.56 0.35 - 5.50 uIU/mL  Urinalysis, Routine w reflex microscopic  Result Value Ref Range   Color, Urine YELLOW Yellow;Lt. Yellow;Straw;Dark Yellow;Amber;Green;Red;Brown   APPearance CLEAR Clear;Turbid;Slightly Cloudy;Cloudy   Specific Gravity, Urine 1.025 1.000 - 1.030   pH 5.5 5.0 - 8.0   Total Protein, Urine TRACE (A) Negative   Urine Glucose NEGATIVE Negative   Ketones, ur TRACE (A) Negative   Bilirubin Urine SMALL (A) Negative   Hgb urine dipstick NEGATIVE Negative   Urobilinogen, UA 0.2 0.0 - 1.0   Leukocytes,Ua NEGATIVE Negative   Nitrite NEGATIVE Negative   WBC, UA 0-2/hpf 0-2/hpf   RBC / HPF none seen 0-2/hpf   Mucus, UA Presence of (A) None   Squamous Epithelial / HPF Rare(0-4/hpf) Rare(0-4/hpf)   Hyaline Casts, UA Presence of (A) None  LDL cholesterol, direct  Result Value Ref Range   Direct LDL 63.0 mg/dL      The ASCVD Risk score (Arnett DK, et al., 2019) failed to calculate for the following reasons:   The 2019 ASCVD risk score is only valid for ages 27 to 38    Assessment & Plan:   Stress at home -     busPIRone HCl; Take 1 tablet (5 mg total) by mouth 2 (two) times daily as needed.   Dispense: 30 tablet; Refill: 0  Acquired hypothyroidism -     TSH  Essential hypertension  Hyperlipidemia, unspecified hyperlipidemia type -     Lipid panel -     Urinalysis, Routine w reflex microscopic -     LDL cholesterol, direct  Skipped beats -     EKG 12-Lead  Closed wedge compression fracture of T8 vertebra  with routine healing, subsequent encounter -     DG Bone Density; Future    Return in about 3 months (around 09/02/2022).  Continue current medications.  Patient is obviously stressed.  Acknowledged the difficulties that he must be having in dealing with his son's injury.  Follow-up in 3 months instead of 6.  Libby Maw, MD  3/14 addendum: Patient called later today and asked for something that he could take for stress as needed.

## 2022-06-03 DIAGNOSIS — S22060A Wedge compression fracture of T7-T8 vertebra, initial encounter for closed fracture: Secondary | ICD-10-CM | POA: Insufficient documentation

## 2022-06-03 NOTE — Addendum Note (Signed)
Addended by: Abelino Derrick A on: 06/03/2022 11:02 AM   Modules accepted: Orders

## 2022-06-14 ENCOUNTER — Ambulatory Visit (HOSPITAL_BASED_OUTPATIENT_CLINIC_OR_DEPARTMENT_OTHER)
Admission: RE | Admit: 2022-06-14 | Discharge: 2022-06-14 | Disposition: A | Discharge status: Home / Self Care | Payer: Medicare Other | Source: Ambulatory Visit | Attending: Family Medicine | Admitting: Family Medicine

## 2022-06-14 DIAGNOSIS — M85832 Other specified disorders of bone density and structure, left forearm: Secondary | ICD-10-CM | POA: Insufficient documentation

## 2022-06-14 DIAGNOSIS — Z13820 Encounter for screening for osteoporosis: Secondary | ICD-10-CM | POA: Insufficient documentation

## 2022-06-14 DIAGNOSIS — S22060D Wedge compression fracture of T7-T8 vertebra, subsequent encounter for fracture with routine healing: Secondary | ICD-10-CM | POA: Insufficient documentation

## 2022-06-14 DIAGNOSIS — E039 Hypothyroidism, unspecified: Secondary | ICD-10-CM | POA: Insufficient documentation

## 2022-06-14 DIAGNOSIS — M85831 Other specified disorders of bone density and structure, right forearm: Secondary | ICD-10-CM | POA: Diagnosis not present

## 2022-06-21 ENCOUNTER — Telehealth: Payer: Self-pay | Admitting: Family Medicine

## 2022-06-21 NOTE — Telephone Encounter (Signed)
Pt is wanting a call back concerning his most recent bone density results. Please advise pt at [phone] [facility])

## 2022-06-21 NOTE — Telephone Encounter (Signed)
Patient aware of  bone density results. No concerns

## 2022-07-01 DIAGNOSIS — Z4889 Encounter for other specified surgical aftercare: Secondary | ICD-10-CM | POA: Diagnosis not present

## 2022-07-08 ENCOUNTER — Telehealth: Payer: Self-pay | Admitting: Family Medicine

## 2022-07-08 NOTE — Telephone Encounter (Signed)
Contacted Risa Grill to schedule their annual wellness visit. Appointment made for 07/15/22.  Rudell Cobb AWV direct phone # [phone]

## 2022-07-15 ENCOUNTER — Telehealth: Payer: Self-pay

## 2022-07-15 ENCOUNTER — Ambulatory Visit (INDEPENDENT_AMBULATORY_CARE_PROVIDER_SITE_OTHER): Payer: Medicare Other

## 2022-07-15 VITALS — BP 138/78 | HR 76 | Temp 97.8°F | Ht 68.5 in | Wt 200.0 lb

## 2022-07-15 DIAGNOSIS — Z0000 Encounter for general adult medical examination without abnormal findings: Secondary | ICD-10-CM

## 2022-07-15 NOTE — Patient Instructions (Signed)
Visit Information  Thank you for taking time to visit with me today. Please don't hesitate to contact me if I can be of assistance to you.   Following are the goals we discussed today:   Goals Addressed             This Visit's Progress    COMPLETED: Care Coordination Activities-No follow up required       Care Coordination Interventions: Advised patient to Annual Wellness exam. Discussed [facility] services and support. Assessed SDOH. Advised to discuss with primary care physician if services needed in the future.            If you are experiencing a Mental Health or Behavioral Health Crisis or need someone to talk to, please call the Suicide and Crisis Lifeline: 988   Patient verbalizes understanding of instructions and care plan provided today and agrees to view in MyChart. Active MyChart status and patient understanding of how to access instructions and care plan via MyChart confirmed with patient.     The patient has been provided with contact information for the care management team and has been advised to call with any health related questions or concerns.  No further follow up required: decline  Hampton Cost J Kingslee Dowse, RN, MSN [facility] Care Management Care Management Coordinator Direct Line [phone]      

## 2022-07-15 NOTE — Patient Instructions (Addendum)
Travis Schwartz , Thank you for taking time to come for your Medicare Wellness Visit. I appreciate your ongoing commitment to your health goals. Please review the following plan we discussed and let me know if I can assist you in the future.   These are the goals we discussed:  Goals      Increase physical activity     Patient Stated     Drink more water     Patient Stated     07/15/2022, no goal     Track and Manage My Blood Pressure-Hypertension     Timeframe:  Long-Range Goal Priority:  High Start Date: 12/24/2020                             Expected End Date: 12/24/2021                      Follow Up within 90 days   - check blood pressure weekly    Why is this important?   You won't feel high blood pressure, but it can still hurt your blood vessels.  High blood pressure can cause heart or kidney problems. It can also cause a stroke.  Making lifestyle changes like losing a little weight or eating less salt will help.  Checking your blood pressure at home and at different times of the day can help to control blood pressure.  If the doctor prescribes medicine remember to take it the way the doctor ordered.  Call the office if you cannot afford the medicine or if there are questions about it.     Notes:         This is a list of the screening recommended for you and due dates:  Health Maintenance  Topic Date Due   Flu Shot  10/20/2022   Medicare Annual Wellness Visit  07/15/2023   DTaP/Tdap/Td vaccine (3 - Td or Tdap) 12/15/2025   Pneumonia Vaccine  Completed   HPV Vaccine  Aged Out   Colon Cancer Screening  Discontinued   COVID-19 Vaccine  Discontinued   Zoster (Shingles) Vaccine  Discontinued    Advanced directives: Please bring a copy of your POA (Power of Attorney) and/or Living Will to your next appointment.   Conditions/risks identified: none  Next appointment: Follow up in one year for your annual wellness visit.   Preventive Care 39 Years and Older,  Male  Preventive care refers to lifestyle choices and visits with your health care provider that can promote health and wellness. What does preventive care include? A yearly physical exam. This is also called an annual well check. Dental exams once or twice a year. Routine eye exams. Ask your health care provider how often you should have your eyes checked. Personal lifestyle choices, including: Daily care of your teeth and gums. Regular physical activity. Eating a healthy diet. Avoiding tobacco and drug use. Limiting alcohol use. Practicing safe sex. Taking low doses of aspirin every day. Taking vitamin and mineral supplements as recommended by your health care provider. What happens during an annual well check? The services and screenings done by your health care provider during your annual well check will depend on your age, overall health, lifestyle risk factors, and family history of disease. Counseling  Your health care provider may ask you questions about your: Alcohol use. Tobacco use. Drug use. Emotional well-being. Home and relationship well-being. Sexual activity. Eating habits. History of falls. Memory and  ability to understand (cognition). Work and work Astronomer. Screening  You may have the following tests or measurements: Height, weight, and BMI. Blood pressure. Lipid and cholesterol levels. These may be checked every 5 years, or more frequently if you are over 76 years old. Skin check. Lung cancer screening. You may have this screening every year starting at age 34 if you have a 30-pack-year history of smoking and currently smoke or have quit within the past 15 years. Fecal occult blood test (FOBT) of the stool. You may have this test every year starting at age 66. Flexible sigmoidoscopy or colonoscopy. You may have a sigmoidoscopy every 5 years or a colonoscopy every 10 years starting at age 29. Prostate cancer screening. Recommendations will vary depending  on your family history and other risks. Hepatitis C blood test. Hepatitis B blood test. Sexually transmitted disease (STD) testing. Diabetes screening. This is done by checking your blood sugar (glucose) after you have not eaten for a while (fasting). You may have this done every 1-3 years. Abdominal aortic aneurysm (AAA) screening. You may need this if you are a current or former smoker. Osteoporosis. You may be screened starting at age 36 if you are at high risk. Talk with your health care provider about your test results, treatment options, and if necessary, the need for more tests. Vaccines  Your health care provider may recommend certain vaccines, such as: Influenza vaccine. This is recommended every year. Tetanus, diphtheria, and acellular pertussis (Tdap, Td) vaccine. You may need a Td booster every 10 years. Zoster vaccine. You may need this after age 76. Pneumococcal 13-valent conjugate (PCV13) vaccine. One dose is recommended after age 57. Pneumococcal polysaccharide (PPSV23) vaccine. One dose is recommended after age 50. Talk to your health care provider about which screenings and vaccines you need and how often you need them. This information is not intended to replace advice given to you by your health care provider. Make sure you discuss any questions you have with your health care provider. Document Released: 04/03/2015 Document Revised: 11/25/2015 Document Reviewed: 01/06/2015 Elsevier Interactive Patient Education  2017 [employer].  Fall Prevention in the Home Falls can cause injuries. They can happen to people of all ages. There are many things you can do to make your home safe and to help prevent falls. What can I do on the outside of my home? Regularly fix the edges of walkways and driveways and fix any cracks. Remove anything that might make you trip as you walk through a door, such as a raised step or threshold. Trim any bushes or trees on the path to your home. Use  bright outdoor lighting. Clear any walking paths of anything that might make someone trip, such as rocks or tools. Regularly check to see if handrails are loose or broken. Make sure that both sides of any steps have handrails. Any raised decks and porches should have guardrails on the edges. Have any leaves, snow, or ice cleared regularly. Use sand or salt on walking paths during winter. Clean up any spills in your garage right away. This includes oil or grease spills. What can I do in the bathroom? Use night lights. Install grab bars by the toilet and in the tub and shower. Do not use towel bars as grab bars. Use non-skid mats or decals in the tub or shower. If you need to sit down in the shower, use a plastic, non-slip stool. Keep the floor dry. Clean up any water that spills on the  floor as soon as it happens. Remove soap buildup in the tub or shower regularly. Attach bath mats securely with double-sided non-slip rug tape. Do not have throw rugs and other things on the floor that can make you trip. What can I do in the bedroom? Use night lights. Make sure that you have a light by your bed that is easy to reach. Do not use any sheets or blankets that are too big for your bed. They should not hang down onto the floor. Have a firm chair that has side arms. You can use this for support while you get dressed. Do not have throw rugs and other things on the floor that can make you trip. What can I do in the kitchen? Clean up any spills right away. Avoid walking on wet floors. Keep items that you use a lot in easy-to-reach places. If you need to reach something above you, use a strong step stool that has a grab bar. Keep electrical cords out of the way. Do not use floor polish or wax that makes floors slippery. If you must use wax, use non-skid floor wax. Do not have throw rugs and other things on the floor that can make you trip. What can I do with my stairs? Do not leave any items on the  stairs. Make sure that there are handrails on both sides of the stairs and use them. Fix handrails that are broken or loose. Make sure that handrails are as long as the stairways. Check any carpeting to make sure that it is firmly attached to the stairs. Fix any carpet that is loose or worn. Avoid having throw rugs at the top or bottom of the stairs. If you do have throw rugs, attach them to the floor with carpet tape. Make sure that you have a light switch at the top of the stairs and the bottom of the stairs. If you do not have them, ask someone to add them for you. What else can I do to help prevent falls? Wear shoes that: Do not have high heels. Have rubber bottoms. Are comfortable and fit you well. Are closed at the toe. Do not wear sandals. If you use a stepladder: Make sure that it is fully opened. Do not climb a closed stepladder. Make sure that both sides of the stepladder are locked into place. Ask someone to hold it for you, if possible. Clearly mark and make sure that you can see: Any grab bars or handrails. First and last steps. Where the edge of each step is. Use tools that help you move around (mobility aids) if they are needed. These include: Canes. Walkers. Scooters. Crutches. Turn on the lights when you go into a dark area. Replace any light bulbs as soon as they burn out. Set up your furniture so you have a clear path. Avoid moving your furniture around. If any of your floors are uneven, fix them. If there are any pets around you, be aware of where they are. Review your medicines with your doctor. Some medicines can make you feel dizzy. This can increase your chance of falling. Ask your doctor what other things that you can do to help prevent falls. This information is not intended to replace advice given to you by your health care provider. Make sure you discuss any questions you have with your health care provider. Document Released: 01/01/2009 Document Revised:  08/13/2015 Document Reviewed: 04/11/2014 Elsevier Interactive Patient Education  2017 [employer].

## 2022-07-15 NOTE — Progress Notes (Signed)
Subjective:   Travis Schwartz is a 82 y.o. male who presents for Medicare Annual/Subsequent preventive examination.  Review of Systems     Cardiac Risk Factors include: advanced age (>3men, >69 women);dyslipidemia;hypertension;male gender     Objective:    Today's Vitals   07/15/22 1121  BP: 138/78  Pulse: 76  Temp: 97.8 F (36.6 C)  TempSrc: Oral  SpO2: 97%  Weight: 200 lb (90.7 kg)  Height: 5' 8.5" (1.74 m)   Body mass index is 29.97 kg/m.     07/15/2022   11:26 AM 05/15/2022    7:53 AM 07/13/2021   10:21 AM 07/07/2020   10:31 AM 01/31/2020    5:48 PM 07/03/2019    9:33 AM  Advanced Directives  Does Patient Have a Medical Advance Directive? Yes No No No No No  Type of Science writer of Healthcare Power of Attorney in Chart? No - copy requested       Would patient like information on creating a medical advance directive?  Yes (ED - Information included in AVS) No - Patient declined Yes (MAU/Ambulatory/Procedural Areas - Information given)  No - Patient declined    Current Medications (verified) Outpatient Encounter Medications as of 07/15/2022  Medication Sig   acetaminophen (TYLENOL 8 HOUR) 650 MG CR tablet    atorvastatin (LIPITOR) 40 MG tablet TAKE 1 TABLET BY MOUTH EVERY DAY   busPIRone (BUSPAR) 5 MG tablet Take 1 tablet (5 mg total) by mouth 2 (two) times daily as needed.   hydrochlorothiazide (HYDRODIURIL) 25 MG tablet TAKE 1 TABLET(25 MG) BY MOUTH DAILY   levothyroxine (SYNTHROID) 75 MCG tablet TAKE 1 TABLET BY MOUTH BEFORE BREAKFAST   Multiple Vitamins-Minerals (CENTRUM SILVER 50+MEN PO) Take 1 tablet by mouth daily.   naproxen sodium (ALEVE) 220 MG tablet Take 220 mg by mouth daily.   omeprazole (PRILOSEC) 20 MG capsule TAKE 1 CAPSULE BY MOUTH DAILY   No facility-administered encounter medications on file as of 07/15/2022.    Allergies (verified) Patient has no known allergies.   History: Past Medical History:   Diagnosis Date   Allergy    no per pt   Arthritis    Cancer (HCC)    prostate   Diverticulosis    2008   GERD (gastroesophageal reflux disease)    Hiatal hernia    Hyperlipidemia    Prostate cancer (HCC) 1992   Past Surgical History:  Procedure Laterality Date   BACK SURGERY     2024   CATARACT EXTRACTION     both eyes   CHOLECYSTECTOMY     COLONOSCOPY     TONSILLECTOMY     TRANSURETHRAL RESECTION OF PROSTATE     UPPER GASTROINTESTINAL ENDOSCOPY     Family History  Problem Relation Age of Onset   Cancer Sister        uterine   Colon cancer Mother    Hyperlipidemia Other    COPD Other        colon   Esophageal cancer Neg Hx    Rectal cancer Neg Hx    Stomach cancer Neg Hx    Social History   Socioeconomic History   Marital status: Married    Spouse name: Not on file   Number of children: Not on file   Years of education: Not on file   Highest education level: Not on file  Occupational History   Not on file  Tobacco Use  Smoking status: Former    Packs/day: 2.00    Years: 3.00    Additional pack years: 0.00    Total pack years: 6.00    Types: Cigarettes    Quit date: 03/22/1963    Years since quitting: 59.3   Smokeless tobacco: Former  Building services engineer Use: Never used  Substance and Sexual Activity   Alcohol use: Yes    Alcohol/week: 0.0 standard drinks of alcohol    Comment: rarely   Drug use: No   Sexual activity: Not on file  Other Topics Concern   Not on file  Social History Narrative   Lives with wife, son, and granddaughter in a 2 story home.  1 child.     Previously worked as a Naval architect for 30 years.     High school education.   Social Determinants of Health   Financial Resource Strain: Low Risk  (07/15/2022)   Overall Financial Resource Strain (CARDIA)    Difficulty of Paying Living Expenses: Not very hard  Food Insecurity: No Food Insecurity (07/15/2022)   Hunger Vital Sign    Worried About Running Out of Food in the Last  Year: Never true    Ran Out of Food in the Last Year: Never true  Transportation Needs: No Transportation Needs (07/15/2022)   PRAPARE - Administrator, Civil Service (Medical): No    Lack of Transportation (Non-Medical): No  Physical Activity: Insufficiently Active (07/15/2022)   Exercise Vital Sign    Days of Exercise per Week: 2 days    Minutes of Exercise per Session: 10 min  Stress: No Stress Concern Present (07/15/2022)   [employer] of Occupational Health - Occupational Stress Questionnaire    Feeling of Stress : Only a little  Social Connections: Unknown (07/15/2022)   Social Connection and Isolation Panel [NHANES]    Frequency of Communication with Friends and Family: More than three times a week    Frequency of Social Gatherings with Friends and Family: Once a week    Attends Religious Services: Not on Marketing executive or Organizations: No    Attends Banker Meetings: Never    Marital Status: Married    Tobacco Counseling Counseling given: Not Answered   Clinical Intake:  Pre-visit preparation completed: Yes  Pain : No/denies pain     Nutritional Status: BMI 25 -29 Overweight Nutritional Risks: None Diabetes: No  How often do you need to have someone help you when you read instructions, pamphlets, or other written materials from your doctor or pharmacy?: 1 - Never  Diabetic? no  Interpreter Needed?: No  Information entered by :: NAllen LPN   Activities of Daily Living    07/15/2022    9:08 AM  In your present state of health, do you have any difficulty performing the following activities:  Hearing? 0  Vision? 0  Difficulty concentrating or making decisions? 0  Walking or climbing stairs? 0  Dressing or bathing? 0  Doing errands, shopping? 0  Preparing Food and eating ? N  Using the Toilet? N  In the past six months, have you accidently leaked urine? N  Do you have problems with loss of bowel control? N   Managing your Medications? N  Managing your Finances? N  Housekeeping or managing your Housekeeping? N    Patient Care Team: Mliss Sax, MD as PCP - General (Family Medicine) Gaspar Cola, [facility] as Pharmacist (Pharmacist)  Indicate  any recent Medical Services you may have received from other than [facility] providers in the past year (date may be approximate).     Assessment:   This is a routine wellness examination for Antwann.  Hearing/Vision screen Vision Screening - Comments:: Regular eye exams, Dr. Carlynn Purl  Dietary issues and exercise activities discussed: Current Exercise Habits: Home exercise routine, Type of exercise: walking, Time (Minutes): 10, Frequency (Times/Week): 2, Weekly Exercise (Minutes/Week): 20   Goals Addressed             This Visit's Progress    Patient Stated       07/15/2022, no goal       Depression Screen    07/15/2022   11:28 AM 06/02/2022    8:32 AM 06/02/2022    8:08 AM 12/02/2021    9:41 AM 07/13/2021   10:22 AM 07/13/2021   10:20 AM 05/31/2021    9:28 AM  PHQ 2/9 Scores  PHQ - 2 Score 0 1 1 0 0 0 0  PHQ- 9 Score  3         Fall Risk    07/15/2022    9:08 AM 06/02/2022    8:08 AM 12/02/2021    9:41 AM 07/13/2021   10:22 AM 05/31/2021    9:28 AM  Fall Risk   Falls in the past year? 1 0 0 0 0  Comment tipped over      Number falls in past yr: 0 0 0 0 0  Injury with Fall? 0 0 0 0   Risk for fall due to : Medication side effect No Fall Risks     Follow up Falls prevention discussed;Education provided;Falls evaluation completed Falls evaluation completed  Falls evaluation completed     FALL RISK PREVENTION PERTAINING TO THE HOME:  Any stairs in or around the home? Yes  If so, are there any without handrails? No  Home free of loose throw rugs in walkways, pet beds, electrical cords, etc? Yes  Adequate lighting in your home to reduce risk of falls? Yes   ASSISTIVE DEVICES UTILIZED TO PREVENT FALLS:  Life alert? No  Use of  a cane, walker or w/c? No  Grab bars in the bathroom? Yes  Shower chair or bench in shower? No  Elevated toilet seat or a handicapped toilet? Yes   TIMED UP AND GO:  Was the test performed? Yes .  Length of time to ambulate 10 feet: 5 sec.   Gait steady and fast without use of assistive device  Cognitive Function:        07/15/2022   11:28 AM 07/07/2020   10:41 AM  6CIT Screen  What Year? 0 points 0 points  What month? 0 points 0 points  What time? 0 points 0 points  Count back from 20 0 points 0 points  Months in reverse 0 points 0 points  Repeat phrase 2 points 0 points  Total Score 2 points 0 points    Immunizations Immunization History  Administered Date(s) Administered   Fluad Quad(high Dose 65+) 12/31/2018, 01/16/2020, 12/01/2020   Influenza Whole 03/21/2004, 12/19/2008   Influenza, High Dose Seasonal PF 12/16/2015, 12/19/2016, 12/26/2017, 12/02/2021   Influenza,inj,Quad PF,6+ Mos 01/14/2014, 12/15/2014   Influenza,inj,quad, With Preservative 11/19/2016   PFIZER Comirnaty(Gray Top)Covid-19 Tri-Sucrose Vaccine 09/15/2020   PFIZER(Purple Top)SARS-COV-2 Vaccination 04/25/2019, 05/23/2019, 03/06/2020   Pneumococcal Conjugate-13 12/15/2014   Pneumococcal Polysaccharide-23 03/21/2005   Pneumococcal-Unspecified 11/19/2016   Td 03/21/2005   Tdap 12/16/2015   Zoster, Live 01/22/2008  TDAP status: Up to date  Flu Vaccine status: Up to date  Pneumococcal vaccine status: Up to date  Covid-19 vaccine status: Completed vaccines  Qualifies for Shingles Vaccine? Yes   Zostavax completed Yes   Shingrix Completed?: No.    Education has been provided regarding the importance of this vaccine. Patient has been advised to call insurance company to determine out of pocket expense if they have not yet received this vaccine. Advised may also receive vaccine at local pharmacy or Health Dept. Verbalized acceptance and understanding.  Screening Tests Health Maintenance  Topic  Date Due   Medicare Annual Wellness (AWV)  07/14/2022   INFLUENZA VACCINE  10/20/2022   DTaP/Tdap/Td (3 - Td or Tdap) 12/15/2025   Pneumonia Vaccine 32+ Years old  Completed   HPV VACCINES  Aged Out   COLONOSCOPY (Pts 45-56yrs Insurance coverage will need to be confirmed)  Discontinued   COVID-19 Vaccine  Discontinued   Zoster Vaccines- Shingrix  Discontinued    Health Maintenance  Health Maintenance Due  Topic Date Due   Medicare Annual Wellness (AWV)  07/14/2022    Colorectal cancer screening: No longer required.   Lung Cancer Screening: (Low Dose CT Chest recommended if Age 31-80 years, 30 pack-year currently smoking OR have quit w/in 15years.) does not qualify.   Lung Cancer Screening Referral: no  Additional Screening:  Hepatitis C Screening: does not qualify;   Vision Screening: Recommended annual ophthalmology exams for early detection of glaucoma and other disorders of the eye. Is the patient up to date with their annual eye exam?  Yes  Who is the provider or what is the name of the office in which the patient attends annual eye exams? Dr. Carlynn Purl If pt is not established with a provider, would they like to be referred to a provider to establish care? No .   Dental Screening: Recommended annual dental exams for proper oral hygiene  Community Resource Referral / Chronic Care Management: CRR required this visit?  No   CCM required this visit?  No      Plan:     I have personally reviewed and noted the following in the patient's chart:   Medical and social history Use of alcohol, tobacco or illicit drugs  Current medications and supplements including opioid prescriptions. Patient is not currently taking opioid prescriptions. Functional ability and status Nutritional status Physical activity Advanced directives List of other physicians Hospitalizations, surgeries, and ER visits in previous 12 months Vitals Screenings to include cognitive, depression, and  falls Referrals and appointments  In addition, I have reviewed and discussed with patient certain preventive protocols, quality metrics, and best practice recommendations. A written personalized care plan for preventive services as well as general preventive health recommendations were provided to patient.     Barb Merino, LPN   [id number]   Nurse Notes: none

## 2022-08-21 ENCOUNTER — Other Ambulatory Visit: Payer: Self-pay | Admitting: Family Medicine

## 2022-08-21 DIAGNOSIS — E039 Hypothyroidism, unspecified: Secondary | ICD-10-CM

## 2022-09-02 ENCOUNTER — Encounter: Payer: Self-pay | Admitting: Family Medicine

## 2022-09-02 ENCOUNTER — Ambulatory Visit (INDEPENDENT_AMBULATORY_CARE_PROVIDER_SITE_OTHER): Payer: Medicare Other | Admitting: Family Medicine

## 2022-09-02 VITALS — BP 134/70 | HR 66 | Temp 98.2°F | Ht 68.0 in | Wt 201.0 lb

## 2022-09-02 DIAGNOSIS — S46911A Strain of unspecified muscle, fascia and tendon at shoulder and upper arm level, right arm, initial encounter: Secondary | ICD-10-CM | POA: Diagnosis not present

## 2022-09-02 DIAGNOSIS — I10 Essential (primary) hypertension: Secondary | ICD-10-CM | POA: Diagnosis not present

## 2022-09-02 DIAGNOSIS — R7309 Other abnormal glucose: Secondary | ICD-10-CM

## 2022-09-02 DIAGNOSIS — F439 Reaction to severe stress, unspecified: Secondary | ICD-10-CM

## 2022-09-02 DIAGNOSIS — E785 Hyperlipidemia, unspecified: Secondary | ICD-10-CM | POA: Diagnosis not present

## 2022-09-02 LAB — BASIC METABOLIC PANEL
BUN: 18 mg/dL (ref 6–23)
CO2: 30 mEq/L (ref 19–32)
Calcium: 8.9 mg/dL (ref 8.4–10.5)
Chloride: 102 mEq/L (ref 96–112)
Creatinine, Ser: 0.92 mg/dL (ref 0.40–1.50)
GFR: 77.4 mL/min (ref >60.00)
Glucose, Bld: 98 mg/dL (ref 70–99)
Potassium: 4 mEq/L (ref 3.5–5.1)
Sodium: 143 mEq/L (ref 135–145)

## 2022-09-02 LAB — HEMOGLOBIN A1C: Hgb A1c MFr Bld: 5.3 % (ref 4.6–6.5)

## 2022-09-02 LAB — LIPID PANEL
Cholesterol: 129 mg/dL (ref 0–200)
HDL: 37.5 mg/dL — ABNORMAL LOW (ref >39.00)
LDL Cholesterol: 52 mg/dL (ref 0–99)
NonHDL: 91.65
Total CHOL/HDL Ratio: 3
Triglycerides: 197 mg/dL — ABNORMAL HIGH (ref 0.0–149.0)
VLDL: 39.4 mg/dL (ref 0.0–40.0)

## 2022-09-02 NOTE — Progress Notes (Signed)
Established Patient Office Visit   Subjective:  Patient ID: Travis Schwartz, male    DOB: 20-Feb-1941  Age: 82 y.o. MRN: [id number]  Chief Complaint  Patient presents with   Medical Management of Chronic Issues    3 month follow up, no concerns. Check right elbow little painful x 1 days. Patient fasting.     HPI Encounter Diagnoses  Name Primary?   Essential hypertension Yes   Hyperlipidemia, unspecified hyperlipidemia type    Stress at home    Elevated glucose    Strain of right elbow, initial encounter    Doing well.  Less stress at home.  He is settled into being a caregiver for his son.  Son is actually doing well and actually does a lot for him.  He is rarely needed the BuSpar.  Strained his right elbow moving a 60 pound box off of his door stoop.  Slowly improving.  He is right-hand dominant.  He is entirely fasting this morning.  No problems with any of his medications.   Review of Systems  Constitutional: Negative.   HENT: Negative.    Eyes:  Negative for blurred vision, discharge and redness.  Respiratory: Negative.    Cardiovascular: Negative.   Gastrointestinal:  Negative for abdominal pain.  Genitourinary: Negative.   Musculoskeletal:  Positive for joint pain. Negative for myalgias.  Skin:  Negative for rash.  Neurological:  Negative for tingling, loss of consciousness and weakness.  Endo/Heme/Allergies:  Negative for polydipsia.      09/02/2022    8:14 AM 07/15/2022   11:28 AM 06/02/2022    8:32 AM  Depression screen PHQ 2/9  Decreased Interest 0 0 0  Down, Depressed, Hopeless 0 0 1  PHQ - 2 Score 0 0 1  Altered sleeping   1  Tired, decreased energy   0  Change in appetite   1  Feeling bad or failure about yourself    0  Trouble concentrating   0  Moving slowly or fidgety/restless   0  Suicidal thoughts   0  PHQ-9 Score   3  Difficult doing work/chores   Somewhat difficult      Current Outpatient Medications:    acetaminophen (TYLENOL 8 HOUR) 650  MG CR tablet, , Disp: , Rfl:    atorvastatin (LIPITOR) 40 MG tablet, TAKE 1 TABLET BY MOUTH EVERY DAY, Disp: 90 tablet, Rfl: 3   busPIRone (BUSPAR) 5 MG tablet, Take 1 tablet (5 mg total) by mouth 2 (two) times daily as needed., Disp: 30 tablet, Rfl: 0   hydrochlorothiazide (HYDRODIURIL) 25 MG tablet, TAKE 1 TABLET(25 MG) BY MOUTH DAILY, Disp: 90 tablet, Rfl: 3   levothyroxine (SYNTHROID) 75 MCG tablet, TAKE 1 TABLET BY MOUTH BEFORE BREAKFAST, Disp: 90 tablet, Rfl: 0   Multiple Vitamins-Minerals (CENTRUM SILVER 50+MEN PO), Take 1 tablet by mouth daily., Disp: , Rfl:    naproxen sodium (ALEVE) 220 MG tablet, Take 220 mg by mouth daily., Disp: , Rfl:    omeprazole (PRILOSEC) 20 MG capsule, TAKE 1 CAPSULE BY MOUTH DAILY, Disp: 90 capsule, Rfl: 1   Objective:     BP 134/70 (BP Location: Left Arm, Patient Position: Sitting, Cuff Size: Normal)   Pulse 66   Temp 98.2 F (36.8 C)   Ht 5\' 8"  (1.727 m)   Wt 201 lb (91.2 kg)   SpO2 96%   BMI 30.56 kg/m  BP Readings from Last 3 Encounters:  09/02/22 134/70  07/15/22 138/78  06/02/22 124/68  Wt Readings from Last 3 Encounters:  09/02/22 201 lb (91.2 kg)  07/15/22 200 lb (90.7 kg)  06/02/22 200 lb (90.7 kg)      Physical Exam Constitutional:      General: He is not in acute distress.    Appearance: Normal appearance. He is not ill-appearing, toxic-appearing or diaphoretic.  HENT:     Head: Normocephalic and atraumatic.     Right Ear: External ear normal.     Left Ear: External ear normal.  Eyes:     General: No scleral icterus.       Right eye: No discharge.        Left eye: No discharge.     Extraocular Movements: Extraocular movements intact.     Conjunctiva/sclera: Conjunctivae normal.  Cardiovascular:     Rate and Rhythm: Normal rate and regular rhythm.  Pulmonary:     Effort: Pulmonary effort is normal. No respiratory distress.     Breath sounds: Normal breath sounds.  Musculoskeletal:     Right elbow: No swelling,  deformity, effusion or lacerations. Tenderness present.       Arms:  Skin:    General: Skin is warm and dry.  Neurological:     Mental Status: He is alert and oriented to person, place, and time.  Psychiatric:        Mood and Affect: Mood normal.        Behavior: Behavior normal.      No results found for any visits on 09/02/22.    The ASCVD Risk score (Arnett DK, et al., 2019) failed to calculate for the following reasons:   The 2019 ASCVD risk score is only valid for ages 45 to 64    Assessment & Plan:   Essential hypertension -     Basic metabolic panel  Hyperlipidemia, unspecified hyperlipidemia type -     Lipid panel  Stress at home  Elevated glucose -     Basic metabolic panel -     Hemoglobin A1c  Strain of right elbow, initial encounter    Return in about 6 months (around 03/04/2023), or if symptoms worsen or fail to improve.  Continue using Aspercreme for elbow.  Please return in the next few weeks if it does not continue to improve.  Continue all medications as above.  Mliss Sax, MD

## 2022-11-21 ENCOUNTER — Other Ambulatory Visit: Payer: Self-pay | Admitting: Family Medicine

## 2022-11-21 DIAGNOSIS — E039 Hypothyroidism, unspecified: Secondary | ICD-10-CM

## 2022-11-28 ENCOUNTER — Other Ambulatory Visit: Payer: Self-pay | Admitting: Family Medicine

## 2022-11-28 DIAGNOSIS — Z0000 Encounter for general adult medical examination without abnormal findings: Secondary | ICD-10-CM

## 2022-12-19 ENCOUNTER — Other Ambulatory Visit: Payer: Self-pay | Admitting: Family Medicine

## 2022-12-19 DIAGNOSIS — I10 Essential (primary) hypertension: Secondary | ICD-10-CM

## 2023-04-07 ENCOUNTER — Ambulatory Visit: Payer: Self-pay | Admitting: Family Medicine

## 2023-04-07 ENCOUNTER — Encounter: Payer: Self-pay | Admitting: Physician Assistant

## 2023-04-07 ENCOUNTER — Ambulatory Visit (INDEPENDENT_AMBULATORY_CARE_PROVIDER_SITE_OTHER): Payer: Medicare Other | Admitting: Physician Assistant

## 2023-04-07 VITALS — BP 126/79 | HR 78 | Temp 97.7°F | Ht 68.0 in | Wt 203.2 lb

## 2023-04-07 DIAGNOSIS — R42 Dizziness and giddiness: Secondary | ICD-10-CM

## 2023-04-07 DIAGNOSIS — H6992 Unspecified Eustachian tube disorder, left ear: Secondary | ICD-10-CM | POA: Diagnosis not present

## 2023-04-07 MED ORDER — FLUTICASONE PROPIONATE 50 MCG/ACT NA SUSP: 2.0000 | Freq: Every day | NASAL | 6 refills | Status: AC

## 2023-04-07 NOTE — Progress Notes (Signed)
Established patient visit   Patient: Travis Schwartz   DOB: 11/05/1940   83 y.o. Male  MRN: [id number] Visit Date: 04/07/2023  Today's healthcare provider: Alfredia Ferguson, PA-C   Chief Complaint  Patient presents with   Dizziness    Present for about 4 days- when standing up. Happened today, but not every time. No headaches. No vision changes.     Ear Fullness    When blowing nose- right side ear pops   Subjective    Pt reports dizziness for the last 4 days, when he goes from sitting to standing/ laying down to standing. Lasts for a few seconds, but the other day he felt like falling so he thought he should come in.  Denies heart palpitations, weakness.  He also reports his L ear feels clogged recently, like it needs to pop. Denies headache, sinus congestion, other upper respiratory symptoms.  Medications: Outpatient Medications Prior to Visit  Medication Sig   acetaminophen (TYLENOL 8 HOUR) 650 MG CR tablet    atorvastatin (LIPITOR) 40 MG tablet TAKE 1 TABLET BY MOUTH EVERY DAY   busPIRone (BUSPAR) 5 MG tablet Take 1 tablet (5 mg total) by mouth 2 (two) times daily as needed.   hydrochlorothiazide (HYDRODIURIL) 25 MG tablet TAKE 1 TABLET(25 MG) BY MOUTH DAILY   levothyroxine (SYNTHROID) 75 MCG tablet TAKE 1 TABLET BY MOUTH BEFORE BREAKFAST   Multiple Vitamins-Minerals (CENTRUM SILVER 50+MEN PO) Take 1 tablet by mouth daily.   naproxen sodium (ALEVE) 220 MG tablet Take 220 mg by mouth daily.   omeprazole (PRILOSEC) 20 MG capsule TAKE 1 CAPSULE BY MOUTH DAILY   No facility-administered medications prior to visit.    Review of Systems  Constitutional:  Negative for fatigue and fever.  HENT:  Positive for ear pain.   Respiratory:  Negative for cough and shortness of breath.   Cardiovascular:  Negative for chest pain, palpitations and leg swelling.  Neurological:  Positive for dizziness. Negative for headaches.       Objective    BP 126/79   Pulse 78   Temp 97.7  F (36.5 C) (Oral)   Ht 5\' 8"  (1.727 m)   Wt 203 lb 4 oz (92.2 kg)   SpO2 99%   BMI 30.90 kg/m    Physical Exam Constitutional:      General: He is awake.     Appearance: He is well-developed.  HENT:     Head: Normocephalic.     Right Ear: Tympanic membrane normal.     Left Ear: Tympanic membrane normal.  Eyes:     Conjunctiva/sclera: Conjunctivae normal.  Neck:     Comments: Questionable bruits L carotid  Cardiovascular:     Rate and Rhythm: Normal rate and regular rhythm.     Heart sounds: Normal heart sounds.  Pulmonary:     Effort: Pulmonary effort is normal.     Breath sounds: Normal breath sounds.  Skin:    General: Skin is warm.  Neurological:     Mental Status: He is alert and oriented to person, place, and time.  Psychiatric:        Attention and Perception: Attention normal.        Mood and Affect: Mood normal.        Speech: Speech normal.        Behavior: Behavior is cooperative.      No results found for any visits on 04/07/23.  Assessment & Plan    Dysfunction  of left eustachian tube -     Fluticasone Propionate; Place 2 sprays into both nostrils daily.  Dispense: 16 g; Refill: 6  Orthostatic dizziness  Recommending flonase for eustacian tube dysfunction  Pts bp is normal, only managed on hydrochlorothiazide 25 mg. Recommending cutting to 12.5 mg to see if his symptoms resolve. F/u with pcp in the next few weeks -- if symptoms dont resolve, would recommend zio monitor, carotid US.   Return if symptoms worsen or fail to improve.       Alfredia Ferguson, PA-C  [facility] at [facility] [phone] (phone) [phone] (fax)  [facility]

## 2023-04-07 NOTE — Telephone Encounter (Signed)
Copied from CRM [id number]. Topic: Clinical - Red Word Triage >> Apr 07, 2023 12:10 PM Suzette B wrote: [facility] that prompted transfer to Nurse Triage: Dizzy spells when trying to move around, when sitting in chair or laying down he goes to get up and gets extremely dizzy. Unable to check blood pressure.   Chief Complaint: dizziness upon standing Symptoms: dizziness when standing/getting up quickly Frequency: intermittent, continual Pertinent Negatives: Patient denies headache, blurred vision at rest, dizziness at rest, heart racing, chest pain, SOB Disposition: [] 911 / [] ED /[] Urgent Care (no appt availability in office) / [x] Appointment(In office/virtual)/ []  [place] [facility]/ [] Home Care/ [] Refused Recommended Disposition /[] [place] [facility]/ []  Follow-up with PCP Additional Notes: Pt reporting that for past 3-4 days "if jump up too quickly then gets dizzy for few min, then okay, just woozy, stay still for a while then okay." Pt reporting blurred vision while dizzy only. Pt confirms no chest pain, SOB, or heart racing, no headache. Pt reporting he "tried to pop ears, one will pop easy but other one won't." Pt confirms on BP med, taken on time today, last changes to med regimen for BP was a year ago. Advised pt be examined in office in next 4 hours, no availability at PCP office today, scheduled with [facility] office, gave location/appt info to pt and confirmed understanding. Advised call back if worsening, hydrate and rest until appt, have another adult drive him to appt, pt verbalized understanding.  Reason for Disposition  [1] Dizziness caused by heat exposure, sudden standing, or poor fluid intake AND [2] no improvement after 2 hours of rest and fluids  Answer Assessment - Initial Assessment Questions 1. DESCRIPTION: "Describe your dizziness."     If jump up too quickly then gets dizzy for few min, then okay, just woozy, stay still for a while then okay 2. LIGHTHEADED: "Do you  feel lightheaded?" (e.g., somewhat faint, woozy, weak upon standing)     woozy 3. VERTIGO: "Do you feel like either you or the room is spinning or tilting?" (i.e. vertigo)     denies 4. SEVERITY: "How bad is it?"  "Do you feel like you are going to faint?" "Can you stand and walk?"   - MILD: Feels slightly dizzy, but walking normally.   - MODERATE: Feels unsteady when walking, but not falling; interferes with normal activities (e.g., school, work).   - SEVERE: Unable to walk without falling, or requires assistance to walk without falling; feels like passing out now.      Able to walk after few min 5. ONSET:  "When did the dizziness begin?"     3-4 days ago 6. AGGRAVATING FACTORS: "Does anything make it worse?" (e.g., standing, change in head position)     Standing, getting up quickly, denies anything else making it worse 7. HEART RATE: "Can you tell me your heart rate?" "How many beats in 15 seconds?"  (Note: not all patients can do this)       Not racing 9. RECURRENT SYMPTOM: "Have you had dizziness before?" If Yes, ask: "When was the last time?" "What happened that time?"     denies 10. OTHER SYMPTOMS: "Do you have any other symptoms?" (e.g., fever, chest pain, vomiting, diarrhea, bleeding)       Denies, blurred vision when dizzy  Protocols used: Dizziness - Lightheadedness-A-AH

## 2023-04-12 ENCOUNTER — Ambulatory Visit (INDEPENDENT_AMBULATORY_CARE_PROVIDER_SITE_OTHER): Payer: Medicare Other | Admitting: Family Medicine

## 2023-04-12 ENCOUNTER — Ambulatory Visit: Payer: Self-pay | Admitting: Family Medicine

## 2023-04-12 ENCOUNTER — Encounter: Payer: Self-pay | Admitting: Family Medicine

## 2023-04-12 VITALS — BP 134/60 | HR 85 | Ht 68.0 in | Wt 204.0 lb

## 2023-04-12 DIAGNOSIS — R42 Dizziness and giddiness: Secondary | ICD-10-CM | POA: Diagnosis not present

## 2023-04-12 DIAGNOSIS — R718 Other abnormality of red blood cells: Secondary | ICD-10-CM | POA: Diagnosis not present

## 2023-04-12 NOTE — Telephone Encounter (Signed)
FYI: This call has been transferred to triage nurse: the Triage Nurse. Once the result note has been entered staff can address the message at that time.  Patient called in with the following symptoms:  Red Word:dizziness pt scheduled appt via mychart, I called nurse triage and transferred over.    Please advise at [facility] [phone]  Message is routed to Provider Pool.

## 2023-04-12 NOTE — Telephone Encounter (Addendum)
  Chief Complaint: dizziness Symptoms: lightheaded/room spins when standing up Frequency: when standing up  Disposition: [] ED /[] Urgent Care (no appt availability in office) / [x] Appointment(In office/virtual)/ []  [facility] [facility]/ [] Home Care/ [] Refused Recommended Disposition /[] [place] [facility]/ []  Follow-up with PCP Additional Notes: Pt was transferred in from [place] office due to dizziness. Pt had appt for 1/27, but needs to be seen sooner. Pt states the dizziness hits when he goes from sitting/lying to standing. Pt stated the room spins and doesn't last long and goes away. Per protocol pt to be seen today. No appts with provider for today, but pt willing to travel to [facility] office and see NP Reola Calkins. Appt at 1300. RN gave care advice and pt verbalized understanding.            Reason for Disposition  [1] Dizziness caused by heat exposure, sudden standing, or poor fluid intake AND [2] no improvement after 2 hours of rest and fluids  Answer Assessment - Initial Assessment Questions 1. DESCRIPTION: "Describe your dizziness."     Gets up quickly; room spins 2. LIGHTHEADED: "Do you feel lightheaded?" (e.g., somewhat faint, woozy, weak upon standing)     denies 3. VERTIGO: "Do you feel like either you or the room is spinning or tilting?" (i.e. vertigo)     yes 4. SEVERITY: "How bad is it?"  "Do you feel like you are going to faint?" "Can you stand and walk?"   - MILD: Feels slightly dizzy, but walking normally.   - MODERATE: Feels unsteady when walking, but not falling; interferes with normal activities (e.g., school, work).   - SEVERE: Unable to walk without falling, or requires assistance to walk without falling; feels like passing out now.      mild 5. ONSET:  "When did the dizziness begin?"     Getting up from chair or bed 6. AGGRAVATING FACTORS: "Does anything make it worse?" (e.g., standing, change in head position)     Sitting to standing  7. HEART RATE:  "Can you tell me your heart rate?" "How many beats in 15 seconds?"  (Note: not all patients can do this)       Doesn't feel HR increases 8. CAUSE: "What do you think is causing the dizziness?"     unsure 9. RECURRENT SYMPTOM: "Have you had dizziness before?" If Yes, ask: "When was the last time?" "What happened that time?"     This is new 10. OTHER SYMPTOMS: "Do you have any other symptoms?" (e.g., fever, chest pain, vomiting, diarrhea, bleeding) Denies all  Protocols used: Dizziness - Lightheadedness-A-AH

## 2023-04-12 NOTE — Patient Instructions (Addendum)
+  Orthostatic hypotension:  Try wearing compression socks Increase water intake Change positions slowly Only take 12.5 mg of HCTZ Labs today Continue Flonase nose spray Keep appointment with Dr. Doreene Burke on Monday  Any severe symptoms, go to the Emergency Department

## 2023-04-12 NOTE — Progress Notes (Signed)
Acute Office Visit  Subjective:     Patient ID: Travis Schwartz, male    DOB: 15-Jun-1940, 83 y.o.   MRN: [id number]  Chief Complaint  Patient presents with   Dizziness    HPI Patient is in today for dizziness.   Discussed the use of AI scribe software for clinical note transcription with the patient, who gave verbal consent to proceed.  History of Present Illness   The patient, with a history of hypertension managed on hydrochlorothiazide, presented with a new onset of dizziness over the past week. The dizziness was noted to be positional, occurring when turning the head, rolling over in bed, or upon standing from a seated position. The patient denied any prior history of similar symptoms.  In addition to the dizziness, the patient reported a recent episode of fluid in the ear, for which Flonase nasal spray was prescribed. The patient had been compliant with the nasal spray, using two sprays every morning.  The patient's hydrochlorothiazide dosage was recently reduced from 25mg  to 12.5mg . Despite this change, the patient reported no improvement in the dizziness, but also no worsening of symptoms. The patient had been on the 25mg  dose for over a year prior to this change.  The patient denied any signs of bleeding and reported a water intake of approximately two 16-ounce bottles per day. The patient also reported having the ability to monitor blood pressure at home, with a recent reading of 140/75 on a wrist cuff.  The patient denied any recent changes in diet, stating that they were eating normally. There was no history of heart failure or other significant cardiac conditions. The patient was scheduled for a follow-up appointment with their primary care provider for next Monday.            ROS All review of systems negative except what is listed in the HPI      Objective:    BP 134/60   Pulse 85   Ht 5\' 8"  (1.727 m)   Wt 204 lb (92.5 kg)   SpO2 100%   BMI 31.02 kg/m    Orthostatic VS for the past 24 hrs:  BP- Lying Pulse- Lying BP- Sitting Pulse- Sitting BP- Standing at 0 minutes Pulse- Standing at 0 minutes  04/12/23 1309 132/59 75 134/60 85 102/54 94     Physical Exam Constitutional:      General: He is awake.     Appearance: He is well-developed.  HENT:     Head: Normocephalic.     Right Ear: Tympanic membrane normal.     Left Ear: Tympanic membrane normal.  Eyes:     Extraocular Movements: Extraocular movements intact.     Conjunctiva/sclera: Conjunctivae normal.  Cardiovascular:     Rate and Rhythm: Normal rate and regular rhythm.     Heart sounds: Normal heart sounds.  Pulmonary:     Effort: Pulmonary effort is normal.     Breath sounds: Normal breath sounds.  Skin:    General: Skin is warm.  Neurological:     Mental Status: He is alert and oriented to person, place, and time.  Psychiatric:        Attention and Perception: Attention normal.        Mood and Affect: Mood normal.        Speech: Speech normal.        Behavior: Behavior is cooperative.       No results found for any visits on 04/12/23.  Assessment & Plan:   Problem List Items Addressed This Visit   None Visit Diagnoses       Orthostatic dizziness    -  Primary   Relevant Orders   CBC with Differential/Platelet   Comprehensive metabolic panel     Try wearing compression socks Increase water intake Change positions slowly Only take 12.5 mg of HCTZ. Monitor BP at home. Labs today Continue Flonase nose spray or potential ear effusion  Keep appointment with Dr. Doreene Burke on Monday  Any severe symptoms, go to the Emergency Department      No orders of the defined types were placed in this encounter.   Return if symptoms worsen or fail to improve, for ; keep upcoming PCP appointment for next week .  Clayborne Dana, NP

## 2023-04-13 ENCOUNTER — Ambulatory Visit: Payer: Medicare Other

## 2023-04-13 ENCOUNTER — Encounter: Payer: Self-pay | Admitting: Family Medicine

## 2023-04-13 DIAGNOSIS — R718 Other abnormality of red blood cells: Secondary | ICD-10-CM | POA: Diagnosis not present

## 2023-04-13 LAB — CBC WITH DIFFERENTIAL/PLATELET
Basophils Absolute: 0.1 10*3/uL (ref 0.0–0.1)
Basophils Relative: 1.3 % (ref 0.0–3.0)
Eosinophils Absolute: 0.1 10*3/uL (ref 0.0–0.7)
Eosinophils Relative: 1.2 % (ref 0.0–5.0)
HCT: 42.9 % (ref 39.0–52.0)
Hemoglobin: 14.6 g/dL (ref 13.0–17.0)
Lymphocytes Relative: 21.9 % (ref 12.0–46.0)
Lymphs Abs: 1.4 10*3/uL (ref 0.7–4.0)
MCHC: 33.9 g/dL (ref 30.0–36.0)
MCV: 100.2 fL — ABNORMAL HIGH (ref 78.0–100.0)
Monocytes Absolute: 0.4 10*3/uL (ref 0.1–1.0)
Monocytes Relative: 5.9 % (ref 3.0–12.0)
Neutro Abs: 4.6 10*3/uL (ref 1.4–7.7)
Neutrophils Relative %: 69.7 % (ref 43.0–77.0)
Platelets: 424 10*3/uL — ABNORMAL HIGH (ref 150.0–400.0)
RBC: 4.28 Mil/uL (ref 4.22–5.81)
RDW: 16.7 % — ABNORMAL HIGH (ref 11.5–15.5)
WBC: 6.6 10*3/uL (ref 4.0–10.5)

## 2023-04-13 LAB — COMPREHENSIVE METABOLIC PANEL
ALT: 25 U/L (ref 0–53)
AST: 22 U/L (ref 0–37)
Albumin: 4.8 g/dL (ref 3.5–5.2)
Alkaline Phosphatase: 61 U/L (ref 39–117)
BUN: 19 mg/dL (ref 6–23)
CO2: 30 meq/L (ref 19–32)
Calcium: 9.3 mg/dL (ref 8.4–10.5)
Chloride: 101 meq/L (ref 96–112)
Creatinine, Ser: 0.99 mg/dL (ref 0.40–1.50)
GFR: 70.57 mL/min (ref >60.00)
Glucose, Bld: 130 mg/dL — ABNORMAL HIGH (ref 70–99)
Potassium: 4.5 meq/L (ref 3.5–5.1)
Sodium: 142 meq/L (ref 135–145)
Total Bilirubin: 1.1 mg/dL (ref 0.2–1.2)
Total Protein: 6.6 g/dL (ref 6.0–8.3)

## 2023-04-13 LAB — B12 AND FOLATE PANEL
Folate: >25.2 ng/mL (ref >5.9)
Vitamin B-12: 318 pg/mL (ref 211–911)

## 2023-04-13 NOTE — Addendum Note (Signed)
Addended by: Hyman Hopes B on: 04/13/2023 12:29 PM   Modules accepted: Orders

## 2023-04-17 ENCOUNTER — Encounter: Payer: Self-pay | Admitting: Family Medicine

## 2023-04-17 ENCOUNTER — Ambulatory Visit (INDEPENDENT_AMBULATORY_CARE_PROVIDER_SITE_OTHER): Payer: Medicare Other | Admitting: Family Medicine

## 2023-04-17 VITALS — BP 132/74 | HR 62 | Temp 98.0°F | Ht 68.0 in | Wt 202.0 lb

## 2023-04-17 DIAGNOSIS — E860 Dehydration: Secondary | ICD-10-CM

## 2023-04-17 DIAGNOSIS — I10 Essential (primary) hypertension: Secondary | ICD-10-CM

## 2023-04-17 DIAGNOSIS — H8192 Unspecified disorder of vestibular function, left ear: Secondary | ICD-10-CM

## 2023-04-17 DIAGNOSIS — D751 Secondary polycythemia: Secondary | ICD-10-CM

## 2023-04-17 LAB — URINALYSIS, ROUTINE W REFLEX MICROSCOPIC
Bilirubin Urine: NEGATIVE
Hgb urine dipstick: NEGATIVE
Ketones, ur: NEGATIVE
Leukocytes,Ua: NEGATIVE
Nitrite: NEGATIVE
Specific Gravity, Urine: 1.02 (ref 1.000–1.030)
Total Protein, Urine: NEGATIVE
Urine Glucose: NEGATIVE
Urobilinogen, UA: 0.2 (ref 0.0–1.0)
pH: 7 (ref 5.0–8.0)

## 2023-04-17 MED ORDER — MECLIZINE HCL 12.5 MG PO TABS: 12.5000 mg | ORAL_TABLET | Freq: Three times a day (TID) | ORAL | 0 refills | Status: AC | PRN

## 2023-04-17 NOTE — Progress Notes (Signed)
Established Patient Office Visit   Subjective:  Patient ID: Travis Schwartz, male    DOB: 1940-04-08  Age: 83 y.o. MRN: [id number]  Chief Complaint  Patient presents with   Dizziness    Dizziness has worsen when changing positions     Dizziness Pertinent negatives include no abdominal pain, headaches, myalgias, rash or weakness.   Encounter Diagnoses  Name Primary?   Essential hypertension Yes   Disorder of labyrinth of left ear    Erythrocytosis    Dehydration    Patient complains of dizziness for the last week or so that involves a spinning sensation when he changes positions.  It happens when he rolls over in bed or sits up.  Blood pressures currently controlled with 12-1/2 of HCTZ.  Chart review shows a general rise in the hemoglobin.  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  He denies headaches.   Review of Systems  Constitutional: Negative.   HENT: Negative.    Eyes:  Negative for blurred vision, discharge and redness.  Respiratory: Negative.    Cardiovascular: Negative.   Gastrointestinal:  Negative for abdominal pain.  Genitourinary: Negative.   Musculoskeletal: Negative.  Negative for myalgias.  Skin:  Negative for rash.  Neurological:  Positive for dizziness. Negative for tingling, loss of consciousness, weakness and headaches.  Endo/Heme/Allergies:  Negative for polydipsia.     Current Outpatient Medications:    acetaminophen (TYLENOL 8 HOUR) 650 MG CR tablet, , Disp: , Rfl:    atorvastatin (LIPITOR) 40 MG tablet, TAKE 1 TABLET BY MOUTH EVERY DAY, Disp: 90 tablet, Rfl: 3   busPIRone (BUSPAR) 5 MG tablet, Take 1 tablet (5 mg total) by mouth 2 (two) times daily as needed., Disp: 30 tablet, Rfl: 0   fluticasone (FLONASE) 50 MCG/ACT nasal spray, Place 2 sprays into both nostrils daily., Disp: 16 g, Rfl: 6   hydrochlorothiazide (HYDRODIURIL) 25 MG tablet, TAKE 1 TABLET(25 MG) BY MOUTH DAILY (Patient taking differently: Take 12.5 mg by mouth daily.), Disp: 90 tablet, Rfl:  3   levothyroxine (SYNTHROID) 75 MCG tablet, TAKE 1 TABLET BY MOUTH BEFORE BREAKFAST, Disp: 90 tablet, Rfl: 2   meclizine (ANTIVERT) 12.5 MG tablet, Take 1 tablet (12.5 mg total) by mouth 3 (three) times daily as needed for dizziness., Disp: 30 tablet, Rfl: 0   Multiple Vitamins-Minerals (CENTRUM SILVER 50+MEN PO), Take 1 tablet by mouth daily., Disp: , Rfl:    naproxen sodium (ALEVE) 220 MG tablet, Take 220 mg by mouth daily., Disp: , Rfl:    omeprazole (PRILOSEC) 20 MG capsule, TAKE 1 CAPSULE BY MOUTH DAILY, Disp: 90 capsule, Rfl: 1   Objective:     BP 132/74 (BP Location: Left Arm, Patient Position: Sitting, Cuff Size: Normal)   Pulse 62   Temp 98 F (36.7 C) (Temporal)   Ht 5\' 8"  (1.727 m)   Wt 202 lb (91.6 kg)   SpO2 97%   BMI 30.71 kg/m  BP Readings from Last 3 Encounters:  04/17/23 132/74  04/12/23 134/60  04/07/23 126/79   Wt Readings from Last 3 Encounters:  04/17/23 202 lb (91.6 kg)  04/12/23 204 lb (92.5 kg)  04/07/23 203 lb 4 oz (92.2 kg)      Physical Exam Constitutional:      General: He is not in acute distress.    Appearance: Normal appearance. He is not ill-appearing, toxic-appearing or diaphoretic.  HENT:     Head: Normocephalic and atraumatic.     Right Ear: Tympanic membrane, ear  canal and external ear normal.     Left Ear: Tympanic membrane, ear canal and external ear normal.     Mouth/Throat:     Mouth: Mucous membranes are moist.     Pharynx: Oropharynx is clear. No oropharyngeal exudate or posterior oropharyngeal erythema.  Eyes:     General: No scleral icterus.       Right eye: No discharge.        Left eye: No discharge.     Extraocular Movements: Extraocular movements intact.     Conjunctiva/sclera: Conjunctivae normal.     Pupils: Pupils are equal, round, and reactive to light.  Cardiovascular:     Rate and Rhythm: Normal rate and regular rhythm.  Pulmonary:     Effort: Pulmonary effort is normal. No respiratory distress.     Breath  sounds: Normal breath sounds.  Abdominal:     General: Bowel sounds are normal.  Musculoskeletal:     Cervical back: No rigidity or tenderness.  Lymphadenopathy:     Cervical: No cervical adenopathy.  Skin:    General: Skin is warm and dry.  Neurological:     Mental Status: He is alert and oriented to person, place, and time.  Psychiatric:        Mood and Affect: Mood normal.        Behavior: Behavior normal.      Results for orders placed or performed in visit on 04/17/23  Urinalysis, Routine w reflex microscopic  Result Value Ref Range   Color, Urine YELLOW Yellow;Lt. Yellow;Straw;Dark Yellow;Amber;Green;Red;Brown   APPearance CLEAR Clear;Turbid;Slightly Cloudy;Cloudy   Specific Gravity, Urine 1.020 1.000 - 1.030   pH 7.0 5.0 - 8.0   Total Protein, Urine NEGATIVE Negative   Urine Glucose NEGATIVE Negative   Ketones, ur NEGATIVE Negative   Bilirubin Urine NEGATIVE Negative   Hgb urine dipstick NEGATIVE Negative   Urobilinogen, UA 0.2 0.0 - 1.0   Leukocytes,Ua NEGATIVE Negative   Nitrite NEGATIVE Negative   WBC, UA 0-2/hpf 0-2/hpf   RBC / HPF 0-2/hpf 0-2/hpf   Squamous Epithelial / HPF Rare(0-4/hpf) Rare(0-4/hpf)  Iron, TIBC and Ferritin Panel  Result Value Ref Range   Iron 107 50 - 180 mcg/dL   TIBC 161 096 - 045 mcg/dL (calc)   %SAT 35 20 - 48 % (calc)   Ferritin 124 24 - 380 ng/mL      The ASCVD Risk score (Arnett DK, et al., 2019) failed to calculate for the following reasons:   The 2019 ASCVD risk score is only valid for ages 84 to 58    Assessment & Plan:   Essential hypertension  Disorder of labyrinth of left ear -     Meclizine HCl; Take 1 tablet (12.5 mg total) by mouth 3 (three) times daily as needed for dizziness.  Dispense: 30 tablet; Refill: 0  Erythrocytosis -     Iron, TIBC and Ferritin Panel -     Ambulatory referral to Hematology / Oncology  Dehydration -     Urinalysis, Routine w reflex microscopic    Return in about 1 month (around  05/18/2023), or if symptoms worsen or fail to improve.  Continue with HCTZ 12.5 mg daily for now.  Gentle rise in hemoglobin.  Iron panel with ferritin is pending.  Checking urinalysis.  Encouraged to increase fluid intake.  Mliss Sax, MD

## 2023-04-18 LAB — IRON,TIBC AND FERRITIN PANEL
%SAT: 35 % (ref 20–48)
Ferritin: 124 ng/mL (ref 24–380)
Iron: 107 ug/dL (ref 50–180)
TIBC: 307 ug/dL (ref 250–425)

## 2023-04-18 NOTE — Addendum Note (Signed)
Addended by: Andrez Grime on: 04/18/2023 07:12 AM   Modules accepted: Orders

## 2023-05-04 ENCOUNTER — Inpatient Hospital Stay: Payer: Medicare Other | Admitting: Medical Oncology

## 2023-05-04 ENCOUNTER — Inpatient Hospital Stay: Payer: Medicare Other | Attending: Medical Oncology

## 2023-05-04 ENCOUNTER — Encounter: Payer: Self-pay | Admitting: Medical Oncology

## 2023-05-04 VITALS — BP 150/62 | HR 59 | Temp 97.8°F | Resp 18 | Ht 68.0 in | Wt 205.0 lb

## 2023-05-04 DIAGNOSIS — J449 Chronic obstructive pulmonary disease, unspecified: Secondary | ICD-10-CM

## 2023-05-04 DIAGNOSIS — Z87891 Personal history of nicotine dependence: Secondary | ICD-10-CM | POA: Diagnosis not present

## 2023-05-04 DIAGNOSIS — R21 Rash and other nonspecific skin eruption: Secondary | ICD-10-CM

## 2023-05-04 DIAGNOSIS — D751 Secondary polycythemia: Secondary | ICD-10-CM

## 2023-05-04 DIAGNOSIS — Z8546 Personal history of malignant neoplasm of prostate: Secondary | ICD-10-CM | POA: Diagnosis not present

## 2023-05-04 DIAGNOSIS — R079 Chest pain, unspecified: Secondary | ICD-10-CM | POA: Diagnosis not present

## 2023-05-04 DIAGNOSIS — R519 Headache, unspecified: Secondary | ICD-10-CM | POA: Diagnosis not present

## 2023-05-04 LAB — CBC WITH DIFFERENTIAL (CANCER CENTER ONLY)
Abs Immature Granulocytes: 0.04 10*3/uL (ref 0.00–0.07)
Basophils Absolute: 0 10*3/uL (ref 0.0–0.1)
Basophils Relative: 1 %
Eosinophils Absolute: 0.1 10*3/uL (ref 0.0–0.5)
Eosinophils Relative: 2 %
HCT: 38.5 % — ABNORMAL LOW (ref 39.0–52.0)
Hemoglobin: 13.3 g/dL (ref 13.0–17.0)
Immature Granulocytes: 1 %
Lymphocytes Relative: 25 %
Lymphs Abs: 1.6 10*3/uL (ref 0.7–4.0)
MCH: 33.8 pg (ref 26.0–34.0)
MCHC: 34.5 g/dL (ref 30.0–36.0)
MCV: 98 fL (ref 80.0–100.0)
Monocytes Absolute: 0.5 10*3/uL (ref 0.1–1.0)
Monocytes Relative: 7 %
Neutro Abs: 4.2 10*3/uL (ref 1.7–7.7)
Neutrophils Relative %: 64 %
Platelet Count: 363 10*3/uL (ref 150–400)
RBC: 3.93 MIL/uL — ABNORMAL LOW (ref 4.22–5.81)
RDW: 15.3 % (ref 11.5–15.5)
WBC Count: 6.5 10*3/uL (ref 4.0–10.5)
nRBC: 0 % (ref 0.0–0.2)

## 2023-05-04 LAB — SAVE SMEAR(SSMR), FOR PROVIDER SLIDE REVIEW

## 2023-05-04 NOTE — Progress Notes (Signed)
[facility] [phone]   [phone]  INITIAL CONSULT NOTE  Patient Care Team: Mliss Sax, MD as PCP - General (Family Medicine) Gaspar Cola, [facility] (Inactive) as Pharmacist (Pharmacist)  CHIEF COMPLAINTS/PURPOSE OF CONSULTATION:  Erythrocytosis  HISTORY OF PRESENTING ILLNESS:  Travis Schwartz 83 y.o. male is referred to our office by their PCP Dr. Doreene Burke for Erythrocytosis:  His last set of lab work was on 04/12/2023 showing a WBC of 6.6, Hgb of 14.6, MCV of 100.2, Platelets of 424. CMP showed an elevated glucose of 130 but otherwise normal. B12 was 318, folate > 25.2. Iron saturation was 35 %, ferritin was 124.   COPD/Asthma- No history of either  He is a non-smoker- He is a non-smoker He does not use any testosterone containing products or medications He does not live at elevation He does snore at night if he is laying on his back.  He does not wake up feeling refreshed  Headaches, rash,  chest pains, SOB No new or significant bone pains No night sweats or unintentional weight loss  Weight and hydration are good  No family history of blood disorders or cancers  He was diagnosed in the early 61s with prostate cancer. He did not require any treatment other than a prostate removal. He has not had any other cancers  He did serve in the [employer]. No known exposures to agent orange.  No known exposures to chemicals or pesticides.  No known radiation exposure.   He was a IT trainer for 30 years. He did have one event where he was exposed to an unknown chemical that he was hauling. He does not have much information on this.   Wt Readings from Last 3 Encounters:  05/04/23 205 lb (93 kg)  04/17/23 202 lb (91.6 kg)  04/12/23 204 lb (92.5 kg)   MEDICAL HISTORY:  Past Medical History:  Diagnosis Date   Allergy    no per pt   Arthritis    Cancer (HCC)    prostate   Diverticulosis    2008   GERD (gastroesophageal reflux  disease)    Hiatal hernia    Hyperlipidemia    Prostate cancer (HCC) 1992    SURGICAL HISTORY: Past Surgical History:  Procedure Laterality Date   BACK SURGERY     2024   CATARACT EXTRACTION     both eyes   CHOLECYSTECTOMY     COLONOSCOPY     TONSILLECTOMY     TRANSURETHRAL RESECTION OF PROSTATE     UPPER GASTROINTESTINAL ENDOSCOPY      SOCIAL HISTORY: Social History   Socioeconomic History   Marital status: Married    Spouse name: Not on file   Number of children: Not on file   Years of education: Not on file   Highest education level: Not on file  Occupational History   Not on file  Tobacco Use   Smoking status: Former    Current packs/day: 0.00    Average packs/day: 2.0 packs/day for 3.0 years (6.0 ttl pk-yrs)    Types: Cigarettes    Start date: 03/21/1960    Quit date: 03/22/1963    Years since quitting: 60.1   Smokeless tobacco: Former  Building services engineer status: Never Used  Substance and Sexual Activity   Alcohol use: Yes    Alcohol/week: 0.0 standard drinks of alcohol    Comment: rarely   Drug use: No   Sexual activity: Not on file  Other Topics  Concern   Not on file  Social History Narrative   Lives with wife, son, and granddaughter in a 2 story home.  1 child.     Previously worked as a Naval architect for 30 years.     High school education.   Social Drivers of Corporate investment banker Strain: Low Risk  (07/15/2022)   Overall Financial Resource Strain (CARDIA)    Difficulty of Paying Living Expenses: Not very hard  Food Insecurity: No Food Insecurity (05/04/2023)   Hunger Vital Sign    Worried About Running Out of Food in the Last Year: Never true    Ran Out of Food in the Last Year: Never true  Transportation Needs: No Transportation Needs (05/04/2023)   PRAPARE - Administrator, Civil Service (Medical): No    Lack of Transportation (Non-Medical): No  Physical Activity: Insufficiently Active (07/15/2022)   Exercise Vital Sign     Days of Exercise per Week: 2 days    Minutes of Exercise per Session: 10 min  Stress: No Stress Concern Present (07/15/2022)   [employer] of Occupational Health - Occupational Stress Questionnaire    Feeling of Stress : Only a little  Social Connections: Unknown (07/15/2022)   Social Connection and Isolation Panel [NHANES]    Frequency of Communication with Friends and Family: More than three times a week    Frequency of Social Gatherings with Friends and Family: Once a week    Attends Religious Services: Not on Marketing executive or Organizations: No    Attends Banker Meetings: Never    Marital Status: Married  Catering manager Violence: Not At Risk (05/04/2023)   Humiliation, Afraid, Rape, and Kick questionnaire    Fear of Current or Ex-Partner: No    Emotionally Abused: No    Physically Abused: No    Sexually Abused: No    FAMILY HISTORY: Family History  Problem Relation Age of Onset   Cancer Sister        uterine   Colon cancer Mother    Hyperlipidemia Other    COPD Other        colon   Esophageal cancer Neg Hx    Rectal cancer Neg Hx    Stomach cancer Neg Hx     ALLERGIES:  has no known allergies.  MEDICATIONS:  Current Outpatient Medications  Medication Sig Dispense Refill   acetaminophen (TYLENOL 8 HOUR) 650 MG CR tablet      atorvastatin (LIPITOR) 40 MG tablet TAKE 1 TABLET BY MOUTH EVERY DAY 90 tablet 3   busPIRone (BUSPAR) 5 MG tablet Take 1 tablet (5 mg total) by mouth 2 (two) times daily as needed. 30 tablet 0   fluticasone (FLONASE) 50 MCG/ACT nasal spray Place 2 sprays into both nostrils daily. 16 g 6   hydrochlorothiazide (HYDRODIURIL) 25 MG tablet TAKE 1 TABLET(25 MG) BY MOUTH DAILY (Patient taking differently: Take 12.5 mg by mouth daily.) 90 tablet 3   levothyroxine (SYNTHROID) 75 MCG tablet TAKE 1 TABLET BY MOUTH BEFORE BREAKFAST 90 tablet 2   meclizine (ANTIVERT) 12.5 MG tablet Take 1 tablet (12.5 mg total) by mouth 3  (three) times daily as needed for dizziness. 30 tablet 0   Multiple Vitamins-Minerals (CENTRUM SILVER 50+MEN PO) Take 1 tablet by mouth daily.     naproxen sodium (ALEVE) 220 MG tablet Take 220 mg by mouth daily.     omeprazole (PRILOSEC) 20 MG capsule TAKE 1 CAPSULE  BY MOUTH DAILY 90 capsule 1   No current facility-administered medications for this visit.    REVIEW OF SYSTEMS:   Constitutional: ( - ) fevers, ( - )  chills , ( - ) night sweats Eyes: ( - ) blurriness of vision, ( - ) double vision, ( - ) watery eyes Ears, nose, mouth, throat, and face: ( - ) mucositis, ( - ) sore throat Respiratory: ( - ) cough, ( - ) dyspnea, ( - ) wheezes Cardiovascular: ( - ) palpitation, ( - ) chest discomfort, ( - ) lower extremity swelling Gastrointestinal:  ( - ) nausea, ( - ) heartburn, ( - ) change in bowel habits Skin: ( - ) abnormal skin rashes Lymphatics: ( - ) new lymphadenopathy, ( - ) easy bruising Neurological: ( - ) numbness, ( - ) tingling, ( - ) new weaknesses Behavioral/Psych: ( - ) mood change, ( - ) new changes  All other systems were reviewed with the patient and are negative.  PHYSICAL EXAMINATION: ECOG PERFORMANCE STATUS: 0 - Asymptomatic  Vitals:   05/04/23 1309  BP: (!) 150/62  Pulse: (!) 59  Resp: 18  Temp: 97.8 F (36.6 C)  SpO2: 100%   Filed Weights   05/04/23 1309  Weight: 205 lb (93 kg)    GENERAL: well appearing male in NAD  SKIN: skin color, texture, turgor are normal, no rashes or significant lesions EYES: conjunctiva are pink and non-injected, sclera clear OROPHARYNX: no exudate, no erythema; lips, buccal mucosa, and tongue normal  NECK: supple, non-tender LYMPH:  no palpable lymphadenopathy in the cervical, axillary or supraclavicular lymph nodes.  LUNGS: clear to auscultation and percussion with normal breathing effort HEART: regular rate & rhythm and no murmurs and no lower extremity edema ABDOMEN: soft, non-tender, non-distended, normal bowel  sounds Musculoskeletal: no cyanosis of digits and no clubbing  PSYCH: alert & oriented x 3, fluent speech NEURO: no focal motor/sensory deficits  LABORATORY DATA:  Pending   ASSESSMENT & PLAN Travis Schwartz is a 83 y.o. male who was referred to [place] for erythrocytosis. Fortunately his Hgb is normal. Given his snoring history I have suggested a sleep study.   We will recheck labs today to verify his Hgb values.   RTC 1 year APP ,labs (CBC w/, smear)  All questions were answered. The patient knows to call the clinic with any problems, questions or concerns.  I have spent a total of 30 minutes minutes of face-to-face and non-face-to-face time, preparing to see the patient, obtaining and/or reviewing separately obtained history, performing a medically appropriate examination, counseling and educating the patient, ordering medications/tests/procedures, referring and communicating with other health care professionals, documenting clinical information in the electronic health record, independently interpreting results and communicating results to the patient, and care coordination.    Clent Jacks PA-C Department of Hematology/Oncology [facility]

## 2023-05-18 ENCOUNTER — Ambulatory Visit (INDEPENDENT_AMBULATORY_CARE_PROVIDER_SITE_OTHER): Payer: Medicare Other | Admitting: Family Medicine

## 2023-05-18 ENCOUNTER — Encounter: Payer: Self-pay | Admitting: Family Medicine

## 2023-05-18 VITALS — BP 134/80 | HR 75 | Temp 98.4°F | Ht 68.0 in | Wt 201.0 lb

## 2023-05-18 DIAGNOSIS — I10 Essential (primary) hypertension: Secondary | ICD-10-CM

## 2023-05-18 DIAGNOSIS — D751 Secondary polycythemia: Secondary | ICD-10-CM | POA: Diagnosis not present

## 2023-05-18 DIAGNOSIS — E039 Hypothyroidism, unspecified: Secondary | ICD-10-CM

## 2023-05-18 DIAGNOSIS — E785 Hyperlipidemia, unspecified: Secondary | ICD-10-CM

## 2023-05-18 DIAGNOSIS — H8192 Unspecified disorder of vestibular function, left ear: Secondary | ICD-10-CM

## 2023-05-18 LAB — LDL CHOLESTEROL, DIRECT: Direct LDL: 78 mg/dL

## 2023-05-18 LAB — TSH: TSH: 1.94 u[IU]/mL (ref 0.35–5.50)

## 2023-05-18 NOTE — Progress Notes (Signed)
 Established Patient Office Visit   Subjective:  Patient ID: Travis Schwartz, male    DOB: December 14, 1940  Age: 83 y.o. MRN: [id number]  Chief Complaint  Patient presents with   Dizziness    1 month follow up. Pt states improvement of dizziness and headaches.     Dizziness Pertinent negatives include no abdominal pain, myalgias, rash or weakness.   Encounter Diagnoses  Name Primary?   Essential hypertension Yes   Disorder of labyrinth of left ear    Erythrocytosis    Acquired hypothyroidism    Hyperlipidemia, unspecified hyperlipidemia type    For follow-up of above.  Dizziness with a spinning sensation has resolved.  Blood pressure well-controlled with HCTZ 12.5 mg daily.  Continues levothyroxine 75 mcg daily for hypothyroidism.  Continue atorvastatin 40 for elevated cholesterol.  Recent follow-up with hematology for erythrocytosis.   Review of Systems  Constitutional: Negative.   HENT: Negative.    Eyes:  Negative for blurred vision, discharge and redness.  Respiratory: Negative.    Cardiovascular: Negative.   Gastrointestinal:  Negative for abdominal pain.  Genitourinary: Negative.   Musculoskeletal: Negative.  Negative for myalgias.  Skin:  Negative for rash.  Neurological:  Negative for dizziness, tingling, loss of consciousness and weakness.  Endo/Heme/Allergies:  Negative for polydipsia.     Current Outpatient Medications:    acetaminophen (TYLENOL 8 HOUR) 650 MG CR tablet, , Disp: , Rfl:    atorvastatin (LIPITOR) 40 MG tablet, TAKE 1 TABLET BY MOUTH EVERY DAY, Disp: 90 tablet, Rfl: 3   busPIRone (BUSPAR) 5 MG tablet, Take 1 tablet (5 mg total) by mouth 2 (two) times daily as needed., Disp: 30 tablet, Rfl: 0   fluticasone (FLONASE) 50 MCG/ACT nasal spray, Place 2 sprays into both nostrils daily., Disp: 16 g, Rfl: 6   hydrochlorothiazide (HYDRODIURIL) 25 MG tablet, TAKE 1 TABLET(25 MG) BY MOUTH DAILY (Patient taking differently: Take 12.5 mg by mouth daily.), Disp: 90  tablet, Rfl: 3   levothyroxine (SYNTHROID) 75 MCG tablet, TAKE 1 TABLET BY MOUTH BEFORE BREAKFAST, Disp: 90 tablet, Rfl: 2   meclizine (ANTIVERT) 12.5 MG tablet, Take 1 tablet (12.5 mg total) by mouth 3 (three) times daily as needed for dizziness., Disp: 30 tablet, Rfl: 0   Multiple Vitamins-Minerals (CENTRUM SILVER 50+MEN PO), Take 1 tablet by mouth daily., Disp: , Rfl:    naproxen sodium (ALEVE) 220 MG tablet, Take 220 mg by mouth daily., Disp: , Rfl:    omeprazole (PRILOSEC) 20 MG capsule, TAKE 1 CAPSULE BY MOUTH DAILY, Disp: 90 capsule, Rfl: 1   Objective:     BP 134/80   Pulse 75   Temp 98.4 F (36.9 C)   Ht 5\' 8"  (1.727 m)   Wt 201 lb (91.2 kg)   SpO2 96%   BMI 30.56 kg/m    Physical Exam Constitutional:      General: He is not in acute distress.    Appearance: Normal appearance. He is not ill-appearing, toxic-appearing or diaphoretic.  HENT:     Head: Normocephalic and atraumatic.     Right Ear: Tympanic membrane, ear canal and external ear normal.     Left Ear: Tympanic membrane, ear canal and external ear normal.     Mouth/Throat:     Mouth: Mucous membranes are moist.     Pharynx: Oropharynx is clear. No oropharyngeal exudate or posterior oropharyngeal erythema.  Eyes:     General: No scleral icterus.       Right eye: No  discharge.        Left eye: No discharge.     Extraocular Movements: Extraocular movements intact.     Conjunctiva/sclera: Conjunctivae normal.     Pupils: Pupils are equal, round, and reactive to light.  Cardiovascular:     Rate and Rhythm: Normal rate and regular rhythm.  Pulmonary:     Effort: Pulmonary effort is normal. No respiratory distress.     Breath sounds: Normal breath sounds.  Musculoskeletal:     Cervical back: No rigidity or tenderness.  Skin:    General: Skin is warm and dry.  Neurological:     Mental Status: He is alert and oriented to person, place, and time.  Psychiatric:        Mood and Affect: Mood normal.         Behavior: Behavior normal.      No results found for any visits on 05/18/23.    The ASCVD Risk score (Arnett DK, et al., 2019) failed to calculate for the following reasons:   The 2019 ASCVD risk score is only valid for ages 38 to 51    Assessment & Plan:   Essential hypertension  Disorder of labyrinth of left ear  Erythrocytosis  Acquired hypothyroidism -     TSH  Hyperlipidemia, unspecified hyperlipidemia type -     LDL cholesterol, direct    Return in about 6 months (around 11/15/2023).    Mliss Sax, MD

## 2023-05-27 ENCOUNTER — Other Ambulatory Visit: Payer: Self-pay | Admitting: Family Medicine

## 2023-05-27 DIAGNOSIS — E785 Hyperlipidemia, unspecified: Secondary | ICD-10-CM

## 2023-05-27 DIAGNOSIS — Z0000 Encounter for general adult medical examination without abnormal findings: Secondary | ICD-10-CM

## 2023-06-21 DIAGNOSIS — M1711 Unilateral primary osteoarthritis, right knee: Secondary | ICD-10-CM | POA: Diagnosis not present

## 2023-06-21 DIAGNOSIS — S83241D Other tear of medial meniscus, current injury, right knee, subsequent encounter: Secondary | ICD-10-CM | POA: Diagnosis not present

## 2023-07-21 ENCOUNTER — Ambulatory Visit (INDEPENDENT_AMBULATORY_CARE_PROVIDER_SITE_OTHER): Payer: Medicare Other

## 2023-07-21 VITALS — BP 110/60 | HR 73 | Temp 97.5°F | Ht 68.0 in | Wt 203.2 lb

## 2023-07-21 DIAGNOSIS — Z0000 Encounter for general adult medical examination without abnormal findings: Secondary | ICD-10-CM | POA: Diagnosis not present

## 2023-07-21 NOTE — Progress Notes (Signed)
 Subjective:   Travis Schwartz is a 83 y.o. who presents for a Medicare Wellness preventive visit.  Visit Complete: In person    Persons Participating in Visit: Patient.  AWV Questionnaire: No: Patient Medicare AWV questionnaire was not completed prior to this visit.  Cardiac Risk Factors include: advanced age (>18men, >74 women);dyslipidemia;hypertension;male gender     Objective:    Today's Vitals   07/21/23 1126  BP: 110/60  Pulse: 73  Temp: (!) 97.5 F (36.4 C)  TempSrc: Oral  SpO2: 94%  Weight: 203 lb 3.2 oz (92.2 kg)  Height: 5\' 8"  (1.727 m)   Body mass index is 30.9 kg/m.     07/21/2023   11:32 AM 05/04/2023    1:08 PM 07/15/2022   11:26 AM 05/15/2022    7:53 AM 07/13/2021   10:21 AM 07/07/2020   10:31 AM 01/31/2020    5:48 PM  Advanced Directives  Does Patient Have a Medical Advance Directive? No Yes Yes No No No No  Type of Advance Directive  Living will;Healthcare Power of [employer] Power of Attorney      Does patient want to make changes to medical advance directive?  No - Patient declined       Copy of Healthcare Power of Attorney in Chart?  No - copy requested No - copy requested      Would patient like information on creating a medical advance directive?    Yes (ED - Information included in AVS) No - Patient declined Yes (MAU/Ambulatory/Procedural Areas - Information given)     Current Medications (verified) Outpatient Encounter Medications as of 07/21/2023  Medication Sig   acetaminophen (TYLENOL 8 HOUR) 650 MG CR tablet    atorvastatin  (LIPITOR) 40 MG tablet TAKE 1 TABLET BY MOUTH EVERY DAY   busPIRone  (BUSPAR ) 5 MG tablet Take 1 tablet (5 mg total) by mouth 2 (two) times daily as needed.   fluticasone  (FLONASE ) 50 MCG/ACT nasal spray Place 2 sprays into both nostrils daily.   hydrochlorothiazide  (HYDRODIURIL ) 25 MG tablet TAKE 1 TABLET(25 MG) BY MOUTH DAILY (Patient taking differently: Take 12.5 mg by mouth daily.)   levothyroxine  (SYNTHROID )  75 MCG tablet TAKE 1 TABLET BY MOUTH BEFORE BREAKFAST   meclizine  (ANTIVERT ) 12.5 MG tablet Take 1 tablet (12.5 mg total) by mouth 3 (three) times daily as needed for dizziness.   Multiple Vitamins-Minerals (CENTRUM SILVER 50+MEN PO) Take 1 tablet by mouth daily.   naproxen sodium (ALEVE) 220 MG tablet Take 220 mg by mouth daily.   omeprazole  (PRILOSEC) 20 MG capsule TAKE 1 CAPSULE BY MOUTH DAILY   No facility-administered encounter medications on file as of 07/21/2023.    Allergies (verified) Patient has no known allergies.   History: Past Medical History:  Diagnosis Date   Allergy    no per pt   Arthritis    Cancer (HCC)    prostate   Diverticulosis    2008   GERD (gastroesophageal reflux disease)    Hiatal hernia    Hyperlipidemia    Prostate cancer (HCC) 1992   Past Surgical History:  Procedure Laterality Date   BACK SURGERY     2024   CATARACT EXTRACTION     both eyes   CHOLECYSTECTOMY     COLONOSCOPY     TONSILLECTOMY     TRANSURETHRAL RESECTION OF PROSTATE     UPPER GASTROINTESTINAL ENDOSCOPY     Family History  Problem Relation Age of Onset   Cancer Sister  uterine   Colon cancer Mother    Hyperlipidemia Other    COPD Other        colon   Esophageal cancer Neg Hx    Rectal cancer Neg Hx    Stomach cancer Neg Hx    Social History   Socioeconomic History   Marital status: Married    Spouse name: Not on file   Number of children: Not on file   Years of education: Not on file   Highest education level: 12th grade  Occupational History   Not on file  Tobacco Use   Smoking status: Former    Current packs/day: 0.00    Average packs/day: 2.0 packs/day for 3.0 years (6.0 ttl pk-yrs)    Types: Cigarettes    Start date: 03/21/1960    Quit date: 03/22/1963    Years since quitting: 60.3   Smokeless tobacco: Former  Building services engineer status: Never Used  Substance and Sexual Activity   Alcohol use: Not Currently    Comment: rarely   Drug use: No    Sexual activity: Not on file  Other Topics Concern   Not on file  Social History Narrative   Lives with wife, son, and granddaughter in a 2 story home.  1 child.     Previously worked as a Naval architect for 30 years.     High school education.   Social Drivers of Corporate investment banker Strain: Low Risk  (07/21/2023)   Overall Financial Resource Strain (CARDIA)    Difficulty of Paying Living Expenses: Not hard at all  Food Insecurity: No Food Insecurity (07/21/2023)   Hunger Vital Sign    Worried About Running Out of Food in the Last Year: Never true    Ran Out of Food in the Last Year: Never true  Transportation Needs: No Transportation Needs (07/21/2023)   PRAPARE - Administrator, Civil Service (Medical): No    Lack of Transportation (Non-Medical): No  Physical Activity: Insufficiently Active (07/21/2023)   Exercise Vital Sign    Days of Exercise per Week: 2 days    Minutes of Exercise per Session: 30 min  Stress: No Stress Concern Present (07/21/2023)   [employer] of Occupational Health - Occupational Stress Questionnaire    Feeling of Stress : Only a little  Recent Concern: Stress - Stress Concern Present (05/17/2023)   [employer] of Occupational Health - Occupational Stress Questionnaire    Feeling of Stress : To some extent  Social Connections: Socially Isolated (07/21/2023)   Social Connection and Isolation Panel [NHANES]    Frequency of Communication with Friends and Family: Once a week    Frequency of Social Gatherings with Friends and Family: Once a week    Attends Religious Services: Never    Database administrator or Organizations: No    Attends Engineer, structural: Never    Marital Status: Married    Tobacco Counseling Counseling given: Not Answered    Clinical Intake:  Pre-visit preparation completed: Yes  Pain : No/denies pain     Nutritional Status: BMI > 30  Obese Nutritional Risks: None Diabetes: No  Lab Results   Component Value Date   HGBA1C 5.3 09/02/2022   HGBA1C 5.6 05/31/2021   HGBA1C 5.5 12/01/2020     How often do you need to have someone help you when you read instructions, pamphlets, or other written materials from your doctor or pharmacy?: 1 - Never  Interpreter  Needed?: No  Information entered by :: NAllen LPN   Activities of Daily Living     07/21/2023   11:27 AM  In your present state of health, do you have any difficulty performing the following activities:  Hearing? 0  Vision? 0  Difficulty concentrating or making decisions? 1  Comment memory sometimes  Walking or climbing stairs? 0  Dressing or bathing? 0  Doing errands, shopping? 0  Preparing Food and eating ? N  Using the Toilet? N  In the past six months, have you accidently leaked urine? N  Do you have problems with loss of bowel control? N  Managing your Medications? N  Managing your Finances? N  Housekeeping or managing your Housekeeping? N    Patient Care Team: Tonna Frederic, MD as PCP - General (Family Medicine)  Indicate any recent Medical Services you may have received from other than [facility] providers in the past year (date may be approximate).     Assessment:   This is a routine wellness examination for Kadmiel.  Hearing/Vision screen Hearing Screening - Comments:: Denies hearing issues Vision Screening - Comments:: Regular eye exams, Dr. Esaw Heckler   Goals Addressed             This Visit's Progress    Patient Stated       07/21/2023, denies goals       Depression Screen     07/21/2023   11:34 AM 05/04/2023    1:16 PM 09/02/2022    8:14 AM 07/15/2022   11:28 AM 06/02/2022    8:32 AM 06/02/2022    8:08 AM 12/02/2021    9:41 AM  PHQ 2/9 Scores  PHQ - 2 Score 0 0 0 0 1 1 0  PHQ- 9 Score 0    3      Fall Risk     07/21/2023   11:34 AM 09/02/2022    8:14 AM 07/15/2022    9:08 AM 06/02/2022    8:08 AM 12/02/2021    9:41 AM  Fall Risk   Falls in the past year? 0 0 1 0 0  Comment   tipped  over    Number falls in past yr: 0 0 0 0 0  Injury with Fall? 0 0 0 0 0  Risk for fall due to : Medication side effect No Fall Risks Medication side effect No Fall Risks   Follow up Falls prevention discussed;Falls evaluation completed Falls evaluation completed Falls prevention discussed;Education provided;Falls evaluation completed Falls evaluation completed     MEDICARE RISK AT HOME:  Medicare Risk at Home Any stairs in or around the home?: Yes If so, are there any without handrails?: No Home free of loose throw rugs in walkways, pet beds, electrical cords, etc?: Yes Adequate lighting in your home to reduce risk of falls?: Yes Life alert?: No Use of a cane, walker or w/c?: No Grab bars in the bathroom?: Yes Shower chair or bench in shower?: No Elevated toilet seat or a handicapped toilet?: Yes  TIMED UP AND GO:  Was the test performed?  Yes  Length of time to ambulate 10 feet: 5 sec Gait steady and fast without use of assistive device  Cognitive Function: 6CIT completed        07/21/2023   11:35 AM 07/15/2022   11:28 AM 07/07/2020   10:41 AM  6CIT Screen  What Year? 0 points 0 points 0 points  What month? 0 points 0 points 0 points  What time?  0 points 0 points 0 points  Count back from 20 0 points 0 points 0 points  Months in reverse 0 points 0 points 0 points  Repeat phrase 0 points 2 points 0 points  Total Score 0 points 2 points 0 points    Immunizations Immunization History  Administered Date(s) Administered   Fluad Quad(high Dose 65+) 12/31/2018, 01/16/2020, 12/01/2020   Influenza Whole 03/21/2004, 12/19/2008   Influenza, High Dose Seasonal PF 12/16/2015, 12/19/2016, 12/26/2017, 12/02/2021   Influenza,inj,Quad PF,6+ Mos 01/14/2014, 12/15/2014   Influenza,inj,quad, With Preservative 11/19/2016   PFIZER Comirnaty(Gray Top)Covid-19 Tri-Sucrose Vaccine 09/15/2020   PFIZER(Purple Top)SARS-COV-2 Vaccination 04/25/2019, 05/23/2019, 03/06/2020   Pneumococcal  Conjugate-13 12/15/2014   Pneumococcal Polysaccharide-23 03/21/2005   Pneumococcal-Unspecified 11/19/2016   Td 03/21/2005   Tdap 12/16/2015   Zoster, Live 01/22/2008    Screening Tests Health Maintenance  Topic Date Due   INFLUENZA VACCINE  10/20/2023   Medicare Annual Wellness (AWV)  07/20/2024   DTaP/Tdap/Td (3 - Td or Tdap) 12/15/2025   Pneumonia Vaccine 55+ Years old  Completed   HPV VACCINES  Aged Out   Meningococcal B Vaccine  Aged Out   Colonoscopy  Discontinued   COVID-19 Vaccine  Discontinued   Zoster Vaccines- Shingrix  Discontinued    Health Maintenance  There are no preventive care reminders to display for this patient.  Health Maintenance Items Addressed: Up to date  Additional Screening:  Vision Screening: Recommended annual ophthalmology exams for early detection of glaucoma and other disorders of the eye.  Dental Screening: Recommended annual dental exams for proper oral hygiene  Community Resource Referral / Chronic Care Management: CRR required this visit?  No   CCM required this visit?  No     Plan:     I have personally reviewed and noted the following in the patient's chart:   Medical and social history Use of alcohol, tobacco or illicit drugs  Current medications and supplements including opioid prescriptions. Patient is not currently taking opioid prescriptions. Functional ability and status Nutritional status Physical activity Advanced directives List of other physicians Hospitalizations, surgeries, and ER visits in previous 12 months Vitals Screenings to include cognitive, depression, and falls Referrals and appointments  In addition, I have reviewed and discussed with patient certain preventive protocols, quality metrics, and best practice recommendations. A written personalized care plan for preventive services as well as general preventive health recommendations were provided to patient.     Areatha Beecham, LPN   3/0/1601    After Visit Summary: (In Person-Printed) AVS printed and given to the patient  Notes: Nothing significant to report at this time.

## 2023-07-21 NOTE — Patient Instructions (Addendum)
 [name] , Thank you for taking time to come for your Medicare Wellness Visit. I appreciate your ongoing commitment to your health goals. Please review the following plan we discussed and let me know if I can assist you in the future.   Referrals/Orders/Follow-Ups/Clinician Recommendations: none  This is a list of the screening recommended for you and due dates:  Health Maintenance  Topic Date Due   Flu Shot  10/20/2023   Medicare Annual Wellness Visit  07/20/2024   DTaP/Tdap/Td vaccine (3 - Td or Tdap) 12/15/2025   Pneumonia Vaccine  Completed   HPV Vaccine  Aged Out   Meningitis B Vaccine  Aged Out   Colon Cancer Screening  Discontinued   COVID-19 Vaccine  Discontinued   Zoster (Shingles) Vaccine  Discontinued    Advanced directives: (Copy Requested) Please bring a copy of your health care power of attorney and living will to the office to be added to your chart at your convenience. You can mail to [facility] [address] [place], [place] [address] or email to [email or web address]  Next Medicare Annual Wellness Visit scheduled for next year: Yes  Have you seen your provider in the last 6 months (3 months if uncontrolled diabetes)? Yes, has an appointment coming up on 11/16/2023   insert Preventive Care attachment Insert FALL PREVENTION attachment if needed

## 2023-08-15 ENCOUNTER — Ambulatory Visit (INDEPENDENT_AMBULATORY_CARE_PROVIDER_SITE_OTHER): Admitting: Family Medicine

## 2023-08-15 ENCOUNTER — Encounter: Payer: Self-pay | Admitting: Family Medicine

## 2023-08-15 VITALS — BP 130/76 | HR 81 | Temp 98.0°F | Resp 16 | Ht 68.0 in | Wt 203.8 lb

## 2023-08-15 DIAGNOSIS — J40 Bronchitis, not specified as acute or chronic: Secondary | ICD-10-CM

## 2023-08-15 LAB — POCT INFLUENZA A/B
Influenza A, POC: NEGATIVE
Influenza B, POC: NEGATIVE

## 2023-08-15 LAB — POC COVID19 BINAXNOW: SARS Coronavirus 2 Ag: NEGATIVE

## 2023-08-15 LAB — POCT RAPID STREP A (OFFICE): Rapid Strep A Screen: NEGATIVE

## 2023-08-15 MED ORDER — BENZONATATE 200 MG PO CAPS: 200.0000 mg | ORAL_CAPSULE | Freq: Two times a day (BID) | ORAL | 0 refills | Status: AC | PRN

## 2023-08-15 MED ORDER — PREDNISONE 20 MG PO TABS: 40.0000 mg | ORAL_TABLET | Freq: Every day | ORAL | 0 refills | Status: AC

## 2023-08-15 NOTE — Patient Instructions (Addendum)
 Continue to push fluids, practice good hand hygiene, and cover your mouth if you cough.  If you start having fevers, shaking or shortness of breath, seek immediate care.  Send me a message in 2-3 days if not significantly.   OK to take Tylenol 1000 mg (2 extra strength tabs) or 975 mg (3 regular strength tabs) every 6 hours as needed.  Let us  know if you need anything.

## 2023-08-15 NOTE — Addendum Note (Signed)
 Addended by: Tifany Hirsch M on: 08/15/2023 01:01 PM   Modules accepted: Orders

## 2023-08-15 NOTE — Progress Notes (Signed)
 Chief Complaint  Patient presents with   Cough    cough    Travis Schwartz here for URI complaints.  Duration: 4 days  Associated symptoms: sinus congestion, rhinorrhea, ear fullness, sore throat, and productive cough Denies: sinus pain, itchy watery eyes, ear pain, ear drainage, wheezing, shortness of breath, myalgia, and fevers Treatment to date: Dayquil, Nyquil Sick contacts: No  Past Medical History:  Diagnosis Date   Allergy    no per pt   Arthritis    Cancer (HCC)    prostate   Diverticulosis    2008   GERD (gastroesophageal reflux disease)    Hiatal hernia    Hyperlipidemia    Prostate cancer (HCC) 1992    Objective BP 130/76 (BP Location: Left Arm, Patient Position: Sitting)   Pulse 81   Temp 98 F (36.7 C) (Oral)   Resp 16   Ht 5\' 8"  (1.727 m)   Wt 203 lb 12.8 oz (92.4 kg)   SpO2 96%   BMI 30.99 kg/m  General: Awake, alert, appears stated age HEENT: AT, [place], ears patent b/l and TM's neg, nares patent w/o discharge, pharynx pink and without exudates, MMM, no sinus TTP Neck: No masses or asymmetry Heart: RRR Lungs: Faint wheezes heard at bases, no accessory muscle use Psych: Age appropriate judgment and insight, normal mood and affect  Wheezy bronchitis - Plan: benzonatate  (TESSALON ) 200 MG capsule, predniSONE  (DELTASONE ) 20 MG tablet  5-day prednisone  burst 40 mg daily, Tessalon  Perles as needed.  He reports he has historically done well with prednisone .  Send message in 2 days if not significantly better.  Continue to push fluids, practice good hand hygiene, cover mouth when coughing. F/u prn. If starting to experience fevers, shaking, or shortness of breath, seek immediate care. Pt voiced understanding and agreement to the plan.  [name] [place], DO 08/15/23 9:45 AM

## 2023-08-16 ENCOUNTER — Other Ambulatory Visit: Payer: Self-pay | Admitting: Family Medicine

## 2023-08-16 DIAGNOSIS — E039 Hypothyroidism, unspecified: Secondary | ICD-10-CM

## 2023-08-22 ENCOUNTER — Emergency Department (HOSPITAL_BASED_OUTPATIENT_CLINIC_OR_DEPARTMENT_OTHER)

## 2023-08-22 ENCOUNTER — Other Ambulatory Visit: Payer: Self-pay

## 2023-08-22 ENCOUNTER — Encounter (HOSPITAL_BASED_OUTPATIENT_CLINIC_OR_DEPARTMENT_OTHER): Payer: Self-pay | Admitting: Emergency Medicine

## 2023-08-22 ENCOUNTER — Emergency Department (HOSPITAL_BASED_OUTPATIENT_CLINIC_OR_DEPARTMENT_OTHER)
Admission: EM | Admit: 2023-08-22 | Discharge: 2023-08-22 | Disposition: A | Discharge status: Home / Self Care | Attending: Emergency Medicine | Admitting: Emergency Medicine

## 2023-08-22 DIAGNOSIS — D72829 Elevated white blood cell count, unspecified: Secondary | ICD-10-CM | POA: Diagnosis not present

## 2023-08-22 DIAGNOSIS — Z79899 Other long term (current) drug therapy: Secondary | ICD-10-CM | POA: Insufficient documentation

## 2023-08-22 DIAGNOSIS — J069 Acute upper respiratory infection, unspecified: Secondary | ICD-10-CM | POA: Diagnosis not present

## 2023-08-22 DIAGNOSIS — R778 Other specified abnormalities of plasma proteins: Secondary | ICD-10-CM | POA: Insufficient documentation

## 2023-08-22 DIAGNOSIS — J181 Lobar pneumonia, unspecified organism: Secondary | ICD-10-CM | POA: Insufficient documentation

## 2023-08-22 DIAGNOSIS — R531 Weakness: Secondary | ICD-10-CM | POA: Insufficient documentation

## 2023-08-22 DIAGNOSIS — R918 Other nonspecific abnormal finding of lung field: Secondary | ICD-10-CM | POA: Diagnosis not present

## 2023-08-22 DIAGNOSIS — R0602 Shortness of breath: Secondary | ICD-10-CM | POA: Diagnosis not present

## 2023-08-22 DIAGNOSIS — J189 Pneumonia, unspecified organism: Secondary | ICD-10-CM

## 2023-08-22 DIAGNOSIS — I959 Hypotension, unspecified: Secondary | ICD-10-CM | POA: Diagnosis not present

## 2023-08-22 DIAGNOSIS — R059 Cough, unspecified: Secondary | ICD-10-CM | POA: Diagnosis not present

## 2023-08-22 DIAGNOSIS — R42 Dizziness and giddiness: Secondary | ICD-10-CM | POA: Diagnosis not present

## 2023-08-22 DIAGNOSIS — R001 Bradycardia, unspecified: Secondary | ICD-10-CM | POA: Diagnosis not present

## 2023-08-22 DIAGNOSIS — W19XXXA Unspecified fall, initial encounter: Secondary | ICD-10-CM | POA: Diagnosis not present

## 2023-08-22 LAB — CBC WITH DIFFERENTIAL/PLATELET
Abs Immature Granulocytes: 0.11 10*3/uL — ABNORMAL HIGH (ref 0.00–0.07)
Basophils Absolute: 0 10*3/uL (ref 0.0–0.1)
Basophils Relative: 0 %
Eosinophils Absolute: 0 10*3/uL (ref 0.0–0.5)
Eosinophils Relative: 0 %
HCT: 39.7 % (ref 39.0–52.0)
Hemoglobin: 14.3 g/dL (ref 13.0–17.0)
Immature Granulocytes: 1 %
Lymphocytes Relative: 7 %
Lymphs Abs: 0.9 10*3/uL (ref 0.7–4.0)
MCH: 34.5 pg — ABNORMAL HIGH (ref 26.0–34.0)
MCHC: 36 g/dL (ref 30.0–36.0)
MCV: 95.9 fL (ref 80.0–100.0)
Monocytes Absolute: 0.9 10*3/uL (ref 0.1–1.0)
Monocytes Relative: 7 %
Neutro Abs: 10.1 10*3/uL — ABNORMAL HIGH (ref 1.7–7.7)
Neutrophils Relative %: 85 %
Platelets: 380 10*3/uL (ref 150–400)
RBC: 4.14 MIL/uL — ABNORMAL LOW (ref 4.22–5.81)
RDW: 16.2 % — ABNORMAL HIGH (ref 11.5–15.5)
WBC: 12 10*3/uL — ABNORMAL HIGH (ref 4.0–10.5)
nRBC: 0 % (ref 0.0–0.2)

## 2023-08-22 LAB — COMPREHENSIVE METABOLIC PANEL WITH GFR
ALT: 31 U/L (ref 0–44)
AST: 18 U/L (ref 15–41)
Albumin: 4.1 g/dL (ref 3.5–5.0)
Alkaline Phosphatase: 75 U/L (ref 38–126)
Anion gap: 14 (ref 5–15)
BUN: 21 mg/dL (ref 8–23)
CO2: 25 mmol/L (ref 22–32)
Calcium: 8.7 mg/dL — ABNORMAL LOW (ref 8.9–10.3)
Chloride: 100 mmol/L (ref 98–111)
Creatinine, Ser: 0.93 mg/dL (ref 0.61–1.24)
GFR, Estimated: >60 mL/min (ref >60)
Glucose, Bld: 132 mg/dL — ABNORMAL HIGH (ref 70–99)
Potassium: 3.4 mmol/L — ABNORMAL LOW (ref 3.5–5.1)
Sodium: 139 mmol/L (ref 135–145)
Total Bilirubin: 2.4 mg/dL — ABNORMAL HIGH (ref 0.0–1.2)
Total Protein: 6.3 g/dL — ABNORMAL LOW (ref 6.5–8.1)

## 2023-08-22 LAB — TROPONIN T, HIGH SENSITIVITY
Troponin T High Sensitivity: 19 ng/L — ABNORMAL HIGH (ref <19)
Troponin T High Sensitivity: 16 ng/L (ref <19)

## 2023-08-22 MED ORDER — DOXYCYCLINE HYCLATE 100 MG PO CAPS
100.0000 mg | ORAL_CAPSULE | Freq: Two times a day (BID) | ORAL | 0 refills | Status: AC
Start: 2023-08-22 — End: ?

## 2023-08-22 MED ORDER — SODIUM CHLORIDE 0.9 % IV SOLN
1.0000 g | Freq: Once | INTRAVENOUS | Status: AC
  Administered 2023-08-22: 1 g via INTRAVENOUS
  Filled 2023-08-22: qty 10

## 2023-08-22 MED ORDER — LACTATED RINGERS IV BOLUS
500.0000 mL | Freq: Once | INTRAVENOUS | Status: AC
  Administered 2023-08-22: 500 mL via INTRAVENOUS

## 2023-08-22 MED ORDER — SODIUM CHLORIDE 0.9 % IV SOLN
500.0000 mg | Freq: Once | INTRAVENOUS | Status: AC
  Administered 2023-08-22: 500 mg via INTRAVENOUS
  Filled 2023-08-22: qty 5

## 2023-08-22 NOTE — ED Triage Notes (Signed)
  Patient BIB EMS after a fall earlier today, and SOB.  Patient states he was removing brush from under his car and lost his balance falling forward, patient states he caught himself with L hand and did not hit his head.  Has some small abrasions on fingers.  Patient recently treated for URI and was on prednisone  taper.  Finished last dose yesterday and still endorses some intermittent SOB.  Denies any pain at this time.  Denies any N/V, or dizziness.

## 2023-08-22 NOTE — ED Provider Notes (Signed)
 [facility]  Provider Note  CSN: [id number] Arrival date & time: 08/22/23 0051  History Chief Complaint  Patient presents with   Weakness    Travis Schwartz is a 83 y.o. male brought to the ED via EMS from home. Family is at bedside now. He reports a recent URI, treated with a prednisone  burst (no taper) which he finished yesterday. Earlier in the day on 6/2 he was bending over to get something from under his car when he lost his balance and fell. He was able to catch himself and did not hit his head but he required assistance from neighbors to get up. He has felt generally weak all day, mostly lying in bed. He has continued to have cough and SOB. Denies chest pain or fever. Tonight he was in bed, rolled over to get up to go to the bathroom and fell out of bed. He was again unable to get up on his own so EMS was called and ultimately brought him in for evaluation.    Home Medications Prior to Admission medications   Medication Sig Start Date End Date Taking? Authorizing Provider  doxycycline  (VIBRAMYCIN ) 100 MG capsule Take 1 capsule (100 mg total) by mouth 2 (two) times daily. 08/22/23  Yes Charmayne Cooper, MD  acetaminophen (TYLENOL 8 HOUR) 650 MG CR tablet     [provider]  atorvastatin  (LIPITOR) 40 MG tablet TAKE 1 TABLET BY MOUTH EVERY DAY 05/29/23   Tonna Frederic, MD  benzonatate  (TESSALON ) 200 MG capsule Take 1 capsule (200 mg total) by mouth 2 (two) times daily as needed for cough. 08/15/23   Jobe Mulder, DO  busPIRone  (BUSPAR ) 5 MG tablet Take 1 tablet (5 mg total) by mouth 2 (two) times daily as needed. 06/02/22   Tonna Frederic, MD  fluticasone  (FLONASE ) 50 MCG/ACT nasal spray Place 2 sprays into both nostrils daily. 04/07/23   Trenton Frock, PA-C  hydrochlorothiazide  (HYDRODIURIL ) 25 MG tablet TAKE 1 TABLET(25 MG) BY MOUTH DAILY Patient taking differently: Take 12.5 mg by mouth daily. 12/19/22    Tonna Frederic, MD  levothyroxine  (SYNTHROID ) 75 MCG tablet TAKE 1 TABLET BY MOUTH BEFORE BREAKFAST 08/16/23   Tonna Frederic, MD  meclizine  (ANTIVERT ) 12.5 MG tablet Take 1 tablet (12.5 mg total) by mouth 3 (three) times daily as needed for dizziness. 04/17/23   Tonna Frederic, MD  Multiple Vitamins-Minerals (CENTRUM SILVER 50+MEN PO) Take 1 tablet by mouth daily.    [provider]  omeprazole  (PRILOSEC) 20 MG capsule TAKE 1 CAPSULE BY MOUTH DAILY 05/29/23   Tonna Frederic, MD     Allergies    Patient has no known allergies.   Review of Systems   Review of Systems Please see HPI for pertinent positives and negatives  Physical Exam BP 121/64   Pulse 80   Temp 98.2 F (36.8 C) (Oral)   Resp (!) 25   Ht 5\' 8"  (1.727 m)   Wt 92.1 kg   SpO2 96%   BMI 30.87 kg/m   Physical Exam Vitals and nursing note reviewed.  Constitutional:      Appearance: Normal appearance.  HENT:     Head: Normocephalic and atraumatic.     Nose: Nose normal.     Mouth/Throat:     Mouth: Mucous membranes are moist.  Eyes:     Extraocular Movements: Extraocular movements intact.     Conjunctiva/sclera: Conjunctivae normal.  Cardiovascular:  Rate and Rhythm: Normal rate.  Pulmonary:     Effort: Pulmonary effort is normal.     Breath sounds: Normal breath sounds. No wheezing or rhonchi.  Abdominal:     General: Abdomen is flat.     Palpations: Abdomen is soft.     Tenderness: There is no abdominal tenderness.  Musculoskeletal:        General: No swelling. Normal range of motion.     Cervical back: Neck supple.  Skin:    General: Skin is warm and dry.  Neurological:     General: No focal deficit present.     Mental Status: He is alert.  Psychiatric:        Mood and Affect: Mood normal.     ED Results / Procedures / Treatments   EKG EKG Interpretation Date/Time:  Tuesday August 22 2023 01:09:21 EDT Ventricular Rate:  87 PR Interval:  155 QRS  Duration:  92 QT Interval:  370 QTC Calculation: 446 R Axis:   -39  Text Interpretation: Sinus rhythm Abnormal R-wave progression, early transition Left ventricular hypertrophy No old tracing to compare Confirmed by Shawnee Dellen [id number]) on 08/22/2023 1:23:58 AM  Procedures Procedures  Medications Ordered in the ED Medications  azithromycin  (ZITHROMAX ) 500 mg in sodium chloride  0.9 % 250 mL IVPB (500 mg Intravenous New Bag/Given 08/22/23 0333)  lactated ringers bolus 500 mL (0 mLs Intravenous Stopped 08/22/23 0240)  cefTRIAXone  (ROCEPHIN ) 1 g in sodium chloride  0.9 % 100 mL IVPB (0 g Intravenous Stopped 08/22/23 0333)    Initial Impression and Plan  Patient here with generalized weakness in setting of recent URI and steroid burst which he finished yesterday. Overall has a reassuring exam and vitals. Will check basic labs and CXR. IVF for possible dehydration. Did not have any head injury and currently has a nonfocal neuro exam so will hold off on imaging.   ED Course   Clinical Course as of 08/22/23 0432  Tue Aug 22, 2023  0203 CBC with mild leukocytosis, possibly from steroid use, otherwise unremarkable.  [CS]  0203 I personally viewed the images from radiology studies and agree with radiologist interpretation: CXR concerning for developing pneumonia [CS]  0213 Initial Trop is mildly elevated, will check delta. CMP is unremarkable, mildly elevated bili may be from dehydration.  [CS]  0428 Delta trop not significantly changed. Discussed admission vs outpatient treatment for his PNA. He prefers to go home now. Wife is amenable. Recommend PCP follow up, RTED for any other concerns.   [CS]    Clinical Course User Index [CS] Charmayne Cooper, MD     MDM Rules/Calculators/A&P Medical Decision Making Problems Addressed: Community acquired pneumonia of right upper lobe of lung: acute illness or injury Generalized weakness: acute illness or injury  Amount and/or Complexity of Data  Reviewed Labs: ordered. Decision-making details documented in ED Course. Radiology: ordered and independent interpretation performed. Decision-making details documented in ED Course. ECG/medicine tests: ordered and independent interpretation performed. Decision-making details documented in ED Course.  Risk Prescription drug management. Decision regarding hospitalization.     Final Clinical Impression(s) / ED Diagnoses Final diagnoses:  Community acquired pneumonia of right upper lobe of lung  Generalized weakness    Rx / DC Orders ED Discharge Orders          Ordered    doxycycline  (VIBRAMYCIN ) 100 MG capsule  2 times daily        08/22/23 0431  Charmayne Cooper, MD 08/22/23 [phone]

## 2023-08-28 ENCOUNTER — Telehealth: Payer: Self-pay

## 2023-08-28 ENCOUNTER — Encounter: Payer: Self-pay | Admitting: Family Medicine

## 2023-08-28 ENCOUNTER — Ambulatory Visit (INDEPENDENT_AMBULATORY_CARE_PROVIDER_SITE_OTHER): Admitting: Family Medicine

## 2023-08-28 VITALS — BP 122/78 | HR 80 | Temp 97.5°F | Ht 68.0 in | Wt 200.4 lb

## 2023-08-28 DIAGNOSIS — E876 Hypokalemia: Secondary | ICD-10-CM | POA: Diagnosis not present

## 2023-08-28 DIAGNOSIS — J189 Pneumonia, unspecified organism: Secondary | ICD-10-CM | POA: Diagnosis not present

## 2023-08-28 DIAGNOSIS — Z09 Encounter for follow-up examination after completed treatment for conditions other than malignant neoplasm: Secondary | ICD-10-CM | POA: Insufficient documentation

## 2023-08-28 NOTE — Progress Notes (Signed)
 Established Patient Office Visit   Subjective:  Patient ID: Travis Schwartz, male    DOB: 1940-04-15  Age: 83 y.o. MRN: [id number]  Chief Complaint  Patient presents with   Hospitalization Follow-up    Er follow up for pneumonia diagnosis. Pt states st first he did not feel better and the Rx sent by ER did not help.     HPI Encounter Diagnoses  Name Primary?   Hospital discharge follow-up Yes   Pneumonia of right upper lobe due to infectious organism    Hypokalemia    Diagnosed with a right upper lobe pneumonia.  Treated with IV azithromycin  and Rocephin .  Continues with a course of Doxy 100  twice daily for 10 days.  He is feeling better and has been afebrile.  He is no longer coughing.  No difficulty breathing.  Review of the hospital labs did show hypokalemia.  He is taking HCTZ for hypertension   Review of Systems  Constitutional: Negative.   HENT: Negative.    Eyes:  Negative for blurred vision, discharge and redness.  Respiratory: Negative.  Negative for cough, sputum production and wheezing.   Cardiovascular: Negative.  Negative for chest pain.  Gastrointestinal:  Negative for abdominal pain.  Genitourinary: Negative.   Musculoskeletal: Negative.  Negative for myalgias.  Skin:  Negative for rash.  Neurological:  Negative for tingling, loss of consciousness and weakness.  Endo/Heme/Allergies:  Negative for polydipsia.     Current Outpatient Medications:    acetaminophen (TYLENOL 8 HOUR) 650 MG CR tablet, , Disp: , Rfl:    atorvastatin  (LIPITOR) 40 MG tablet, TAKE 1 TABLET BY MOUTH EVERY DAY, Disp: 90 tablet, Rfl: 3   benzonatate  (TESSALON ) 200 MG capsule, Take 1 capsule (200 mg total) by mouth 2 (two) times daily as needed for cough., Disp: 20 capsule, Rfl: 0   busPIRone  (BUSPAR ) 5 MG tablet, Take 1 tablet (5 mg total) by mouth 2 (two) times daily as needed., Disp: 30 tablet, Rfl: 0   doxycycline  (VIBRAMYCIN ) 100 MG capsule, Take 1 capsule (100 mg total) by mouth 2  (two) times daily., Disp: 20 capsule, Rfl: 0   fluticasone  (FLONASE ) 50 MCG/ACT nasal spray, Place 2 sprays into both nostrils daily., Disp: 16 g, Rfl: 6   hydrochlorothiazide  (HYDRODIURIL ) 25 MG tablet, TAKE 1 TABLET(25 MG) BY MOUTH DAILY (Patient taking differently: Take 12.5 mg by mouth daily.), Disp: 90 tablet, Rfl: 3   levothyroxine  (SYNTHROID ) 75 MCG tablet, TAKE 1 TABLET BY MOUTH BEFORE BREAKFAST, Disp: 90 tablet, Rfl: 2   meclizine  (ANTIVERT ) 12.5 MG tablet, Take 1 tablet (12.5 mg total) by mouth 3 (three) times daily as needed for dizziness., Disp: 30 tablet, Rfl: 0   Multiple Vitamins-Minerals (CENTRUM SILVER 50+MEN PO), Take 1 tablet by mouth daily., Disp: , Rfl:    omeprazole  (PRILOSEC) 20 MG capsule, TAKE 1 CAPSULE BY MOUTH DAILY, Disp: 90 capsule, Rfl: 1   Objective:     BP 122/78 (Cuff Size: Normal)   Pulse 80   Temp (!) 97.5 F (36.4 C) (Temporal)   Ht 5\' 8"  (1.727 m)   Wt 200 lb 6.4 oz (90.9 kg)   SpO2 96%   BMI 30.47 kg/m    Physical Exam Constitutional:      General: He is not in acute distress.    Appearance: Normal appearance. He is not ill-appearing, toxic-appearing or diaphoretic.  HENT:     Head: Normocephalic and atraumatic.     Right Ear: External ear normal.  Left Ear: External ear normal.     Mouth/Throat:     Mouth: Mucous membranes are moist.     Pharynx: Oropharynx is clear. No oropharyngeal exudate or posterior oropharyngeal erythema.  Eyes:     General: No scleral icterus.       Right eye: No discharge.        Left eye: No discharge.     Extraocular Movements: Extraocular movements intact.     Conjunctiva/sclera: Conjunctivae normal.     Pupils: Pupils are equal, round, and reactive to light.  Cardiovascular:     Rate and Rhythm: Normal rate and regular rhythm.  Pulmonary:     Effort: Pulmonary effort is normal. No respiratory distress.     Breath sounds: Normal breath sounds. No wheezing, rhonchi or rales.  Musculoskeletal:      Cervical back: No rigidity or tenderness.  Skin:    General: Skin is warm and dry.  Neurological:     Mental Status: He is alert and oriented to person, place, and time.  Psychiatric:        Mood and Affect: Mood normal.        Behavior: Behavior normal.      No results found for any visits on 08/28/23.    The ASCVD Risk score (Arnett DK, et al., 2019) failed to calculate for the following reasons:   The 2019 ASCVD risk score is only valid for ages 50 to 56    Assessment & Plan:   Hospital discharge follow-up  Pneumonia of right upper lobe due to infectious organism  Hypokalemia -     Basic metabolic panel with GFR    Return Should have appointment with me at the end of August..  Complete course of doxycycline .  Rechecking potassium.  May need supplement.  Consider rechecking chest x-ray on follow-up in August.  Tonna Frederic, MD

## 2023-08-28 NOTE — Transitions of Care (Post Inpatient/ED Visit) (Signed)
   08/28/2023  Name: Travis Schwartz MRN: [id number] DOB: 04/17/1940  Today's TOC FU Call Status: Today's TOC FU Call Status:: Successful TOC FU Call Completed TOC FU Call Complete Date: 08/28/23 Patient's Name and Date of Birth confirmed.  Transition Care Management Follow-up Telephone Call Date of Discharge: 08/22/23 Discharge Facility: [facility]) Type of Discharge: Emergency Department How have you been since you were released from the hospital?: Same Any questions or concerns?: No  Items Reviewed: Did you receive and understand the discharge instructions provided?: Yes Medications obtained,verified, and reconciled?: Yes (Medications Reviewed) Any new allergies since your discharge?: No Dietary orders reviewed?: NA Do you have support at home?: Yes  Medications Reviewed Today: Medications Reviewed Today   Medications were not reviewed in this encounter     Home Care and Equipment/Supplies: Were Home Health Services Ordered?: NA Any new equipment or medical supplies ordered?: NA  Functional Questionnaire: Do you need assistance with bathing/showering or dressing?: No Do you need assistance with meal preparation?: No Do you need assistance with eating?: No Do you have difficulty maintaining continence: No Do you need assistance with getting out of bed/getting out of a chair/moving?: No Do you have difficulty managing or taking your medications?: No  Follow up appointments reviewed: PCP Follow-up appointment confirmed?: Yes Date of PCP follow-up appointment?: 08/28/23 Follow-up Provider: Dr. Tilmon Font Special[facility] Follow-up appointment confirmed?: NA Do you need transportation to your follow-up appointment?: No Do you understand care options if your condition(s) worsen?: Yes-patient verbalized understanding    SIGNATURE Laroy Plunk, CMA

## 2023-08-29 ENCOUNTER — Ambulatory Visit: Payer: Self-pay | Admitting: Family Medicine

## 2023-08-29 LAB — BASIC METABOLIC PANEL WITH GFR
BUN: 18 mg/dL (ref 6–23)
CO2: 30 meq/L (ref 19–32)
Calcium: 9.3 mg/dL (ref 8.4–10.5)
Chloride: 103 meq/L (ref 96–112)
Creatinine, Ser: 1.02 mg/dL (ref 0.40–1.50)
GFR: 67.91 mL/min (ref >60.00)
Glucose, Bld: 99 mg/dL (ref 70–99)
Potassium: 3.9 meq/L (ref 3.5–5.1)
Sodium: 142 meq/L (ref 135–145)

## 2023-10-26 DIAGNOSIS — M62830 Muscle spasm of back: Secondary | ICD-10-CM | POA: Diagnosis not present

## 2023-10-26 DIAGNOSIS — M9904 Segmental and somatic dysfunction of sacral region: Secondary | ICD-10-CM | POA: Diagnosis not present

## 2023-10-26 DIAGNOSIS — M9903 Segmental and somatic dysfunction of lumbar region: Secondary | ICD-10-CM | POA: Diagnosis not present

## 2023-10-26 DIAGNOSIS — M5417 Radiculopathy, lumbosacral region: Secondary | ICD-10-CM | POA: Diagnosis not present

## 2023-11-01 DIAGNOSIS — M9904 Segmental and somatic dysfunction of sacral region: Secondary | ICD-10-CM | POA: Diagnosis not present

## 2023-11-01 DIAGNOSIS — M5417 Radiculopathy, lumbosacral region: Secondary | ICD-10-CM | POA: Diagnosis not present

## 2023-11-01 DIAGNOSIS — M62830 Muscle spasm of back: Secondary | ICD-10-CM | POA: Diagnosis not present

## 2023-11-01 DIAGNOSIS — M9903 Segmental and somatic dysfunction of lumbar region: Secondary | ICD-10-CM | POA: Diagnosis not present

## 2023-11-16 ENCOUNTER — Ambulatory Visit: Payer: Medicare Other | Admitting: Family Medicine

## 2023-11-21 ENCOUNTER — Ambulatory Visit: Admitting: Family Medicine

## 2023-11-22 ENCOUNTER — Encounter: Payer: Self-pay | Admitting: Family Medicine

## 2023-11-28 ENCOUNTER — Other Ambulatory Visit: Payer: Self-pay | Admitting: Family Medicine

## 2023-11-28 DIAGNOSIS — Z0000 Encounter for general adult medical examination without abnormal findings: Secondary | ICD-10-CM

## 2024-02-21 ENCOUNTER — Ambulatory Visit: Payer: Self-pay

## 2024-02-21 NOTE — Telephone Encounter (Signed)
 FYI Only or Action Required?: FYI only for provider: appointment scheduled on 02/22/24.  Patient was last seen in primary care on 08/28/2023 by Berneta Elsie Sayre, MD.  Called Nurse Triage reporting Dizziness.  Symptoms began 1-2 years ago, flaring up again over the past couple of months.  Interventions attempted: Rest, hydration, or home remedies.  Symptoms are: stable.  Triage Disposition: See Physician Within 24 Hours  Patient/caregiver understands and will follow disposition?: Yes           Copied from CRM [id number]. Topic: Clinical - Red Word Triage >> Feb 21, 2024  1:37 PM Travis Schwartz wrote: Travis Schwartz that prompted transfer to Nurse Triage: Patient has been feeling dizzy when standing up too quickly, and weak. He would like to see Dr. Berneta since it's been a while. Reason for Disposition  Taking a medicine that could cause dizziness (e.g., blood pressure medications, diuretics)  Answer Assessment - Initial Assessment Questions 1. DESCRIPTION: Describe your dizziness.     If I jump up real quick, it's something like vertigo; lightheaded when standing up quickly.  2. LIGHTHEADED: Do you feel lightheaded? (e.g., somewhat faint, woozy, weak upon standing)     Yes.  3. VERTIGO: Do you feel like either you or the room is spinning or tilting? (i.e., vertigo)     He states he has had that before but not recently. Not in a long time.  4. SEVERITY: How bad is it?  Do you feel like you are going to faint? Can you stand and walk?     He states at one point it was severe enough that he had to hold onto something but not currently affecting his gait/walking. He states he is able to stand and walk without assistance.  5. ONSET:  When did the dizziness begin?     Couple of months, intermittent.  6. AGGRAVATING FACTORS: Does anything make it worse? (e.g., standing, change in head position)     Standing. Especially after lying down for a while; he states he was at  the dentist recently and had been lying down for over an hour and felt dizzy when he stood up.  7. HEART RATE: Can you tell me your heart rate? How many beats in 15 seconds?  (Note: Not all patients can do this.)       He has not checked his BP or HR today. Denies hypotension or hypertension recently.  8. CAUSE: What do you think is causing the dizziness? (e.g., decreased fluids or food, diarrhea, emotional distress, heat exposure, new medicine, sudden standing, vomiting; unknown)     Standing suddenly.  9. RECURRENT SYMPTOM: Have you had dizziness before? If Yes, ask: When was the last time? What happened that time?     He states he was seen a year or 2 ago for dizziness.  10. OTHER SYMPTOMS: Do you have any other symptoms? (e.g., fever, chest pain, vomiting, diarrhea, bleeding)       Denies chest pain, vomiting, fever, bleeding. He states he gets pulsations in his legs when he sits on the toilet, not like cramping, and states it is painful in his toes.  Protocols used: Dizziness - Lightheadedness-A-AH

## 2024-02-22 ENCOUNTER — Encounter: Payer: Self-pay | Admitting: Family Medicine

## 2024-02-22 ENCOUNTER — Ambulatory Visit: Admitting: Family Medicine

## 2024-02-22 VITALS — BP 136/64 | HR 66 | Temp 97.7°F | Ht 68.0 in | Wt 201.6 lb

## 2024-02-22 DIAGNOSIS — E876 Hypokalemia: Secondary | ICD-10-CM

## 2024-02-22 DIAGNOSIS — Z79899 Other long term (current) drug therapy: Secondary | ICD-10-CM | POA: Diagnosis not present

## 2024-02-22 DIAGNOSIS — Z6830 Body mass index (BMI) 30.0-30.9, adult: Secondary | ICD-10-CM | POA: Diagnosis not present

## 2024-02-22 DIAGNOSIS — E785 Hyperlipidemia, unspecified: Secondary | ICD-10-CM

## 2024-02-22 DIAGNOSIS — Z131 Encounter for screening for diabetes mellitus: Secondary | ICD-10-CM

## 2024-02-22 DIAGNOSIS — R42 Dizziness and giddiness: Secondary | ICD-10-CM | POA: Diagnosis not present

## 2024-02-22 DIAGNOSIS — I10 Essential (primary) hypertension: Secondary | ICD-10-CM

## 2024-02-22 DIAGNOSIS — E039 Hypothyroidism, unspecified: Secondary | ICD-10-CM | POA: Diagnosis not present

## 2024-02-22 LAB — CBC
HCT: 39.1 % (ref 39.0–52.0)
Hemoglobin: 13.8 g/dL (ref 13.0–17.0)
MCHC: 35.2 g/dL (ref 30.0–36.0)
MCV: 96.3 fl (ref 78.0–100.0)
Platelets: 412 K/uL — ABNORMAL HIGH (ref 150.0–400.0)
RBC: 4.07 Mil/uL — ABNORMAL LOW (ref 4.22–5.81)
RDW: 17 % — ABNORMAL HIGH (ref 11.5–15.5)
WBC: 5.6 K/uL (ref 4.0–10.5)

## 2024-02-22 LAB — BASIC METABOLIC PANEL WITH GFR
BUN: 22 mg/dL (ref 6–23)
CO2: 30 meq/L (ref 19–32)
Calcium: 9 mg/dL (ref 8.4–10.5)
Chloride: 101 meq/L (ref 96–112)
Creatinine, Ser: 0.98 mg/dL (ref 0.40–1.50)
GFR: 71.01 mL/min (ref >60.00)
Glucose, Bld: 92 mg/dL (ref 70–99)
Potassium: 3.3 meq/L — ABNORMAL LOW (ref 3.5–5.1)
Sodium: 143 meq/L (ref 135–145)

## 2024-02-22 LAB — URINALYSIS, ROUTINE W REFLEX MICROSCOPIC
Bilirubin Urine: NEGATIVE
Hgb urine dipstick: NEGATIVE
Leukocytes,Ua: NEGATIVE
Nitrite: NEGATIVE
RBC / HPF: NONE SEEN (ref 0–?)
Specific Gravity, Urine: 1.025 (ref 1.000–1.030)
Urine Glucose: NEGATIVE
Urobilinogen, UA: 1 (ref 0.0–1.0)
WBC, UA: NONE SEEN (ref 0–?)
pH: 6 (ref 5.0–8.0)

## 2024-02-22 LAB — VITAMIN B12: Vitamin B-12: 306 pg/mL (ref 211–911)

## 2024-02-22 LAB — LIPID PANEL
Cholesterol: 140 mg/dL (ref 0–200)
HDL: 40.6 mg/dL (ref >39.00)
LDL Cholesterol: 64 mg/dL (ref 0–99)
NonHDL: 99.26
Total CHOL/HDL Ratio: 3
Triglycerides: 176 mg/dL — ABNORMAL HIGH (ref 0.0–149.0)
VLDL: 35.2 mg/dL (ref 0.0–40.0)

## 2024-02-22 LAB — TSH: TSH: 3.74 u[IU]/mL (ref 0.35–5.50)

## 2024-02-22 LAB — HEMOGLOBIN A1C: Hgb A1c MFr Bld: 5.4 % (ref 4.6–6.5)

## 2024-02-22 MED ORDER — HYDROCHLOROTHIAZIDE 25 MG PO TABS: 25.0000 mg | ORAL_TABLET | Freq: Every day | ORAL | 3 refills | Status: DC

## 2024-02-22 NOTE — Progress Notes (Addendum)
 Established Patient Office Visit   Subjective:  Patient ID: Travis Schwartz, male    DOB: [birth year]  Age: 83 y.o. MRN: [id number]  Chief Complaint  Patient presents with   Dizziness    Couple weeks ago Tingling  in legs and toes    Dizziness Pertinent negatives include no abdominal pain, myalgias, rash or weakness.   Encounter Diagnoses  Name Primary?   Essential hypertension Yes   Hyperlipidemia, unspecified hyperlipidemia type    Orthostatic dizziness    Screening for diabetes mellitus    Acquired hypothyroidism    Current use of proton pump inhibitor    Hypokalemia    For follow-up of above.  Fasting for 6 hours.  Has occasional lightheadedness when he stands up quickly.  HCTZ 25 mg dose was decreased to 12.5 mg at some point this year.  Blood pressure has been running a little bit higher.   Review of Systems  Constitutional: Negative.   HENT: Negative.    Eyes:  Negative for blurred vision, discharge and redness.  Respiratory: Negative.    Cardiovascular: Negative.   Gastrointestinal:  Negative for abdominal pain.  Genitourinary: Negative.   Musculoskeletal: Negative.  Negative for myalgias.  Skin:  Negative for rash.  Neurological:  Positive for dizziness. Negative for tingling, loss of consciousness and weakness.  Endo/Heme/Allergies:  Negative for polydipsia.     Current Outpatient Medications:    acetaminophen (TYLENOL 8 HOUR) 650 MG CR tablet, , Disp: , Rfl:    atorvastatin  (LIPITOR) 40 MG tablet, TAKE 1 TABLET BY MOUTH EVERY DAY, Disp: 90 tablet, Rfl: 3   doxycycline  (VIBRAMYCIN ) 100 MG capsule, Take 1 capsule (100 mg total) by mouth 2 (two) times daily., Disp: 20 capsule, Rfl: 0   fluticasone  (FLONASE ) 50 MCG/ACT nasal spray, Place 2 sprays into both nostrils daily., Disp: 16 g, Rfl: 6   hydrochlorothiazide  (HYDRODIURIL ) 25 MG tablet, Take 1 tablet (25 mg total) by mouth daily., Disp: 90 tablet, Rfl: 3   levothyroxine  (SYNTHROID ) 75 MCG tablet, TAKE 1  TABLET BY MOUTH BEFORE BREAKFAST, Disp: 90 tablet, Rfl: 2   meclizine  (ANTIVERT ) 12.5 MG tablet, Take 1 tablet (12.5 mg total) by mouth 3 (three) times daily as needed for dizziness., Disp: 30 tablet, Rfl: 0   Multiple Vitamins-Minerals (CENTRUM SILVER 50+MEN PO), Take 1 tablet by mouth daily., Disp: , Rfl:    omeprazole  (PRILOSEC) 20 MG capsule, TAKE 1 CAPSULE BY MOUTH DAILY, Disp: 90 capsule, Rfl: 1   potassium chloride  SA (KLOR-CON  M) 20 MEQ tablet, Take 1 tablet (20 mEq total) by mouth daily., Disp: 30 tablet, Rfl: 3   benzonatate  (TESSALON ) 200 MG capsule, Take 1 capsule (200 mg total) by mouth 2 (two) times daily as needed for cough. (Patient not taking: Reported on 02/22/2024), Disp: 20 capsule, Rfl: 0   busPIRone  (BUSPAR ) 5 MG tablet, Take 1 tablet (5 mg total) by mouth 2 (two) times daily as needed. (Patient not taking: Reported on 02/22/2024), Disp: 30 tablet, Rfl: 0   Objective:     BP 136/64 (BP Location: Right Arm, Cuff Size: Normal)   Pulse 66   Temp 97.7 F (36.5 C) (Temporal)   Ht 5' 8 (1.727 m)   Wt 201 lb 9.6 oz (91.4 kg)   SpO2 96%   BMI 30.65 kg/m  BP Readings from Last 3 Encounters:  02/22/24 136/64  08/28/23 122/78  08/22/23 121/64   Wt Readings from Last 3 Encounters:  02/22/24 201 lb 9.6 oz (91.4 kg)  08/28/23 200 lb 6.4 oz (90.9 kg)  08/22/23 203 lb (92.1 kg)      Physical Exam Constitutional:      General: He is not in acute distress.    Appearance: Normal appearance. He is not ill-appearing, toxic-appearing or diaphoretic.  HENT:     Head: Normocephalic and atraumatic.     Right Ear: External ear normal.     Left Ear: External ear normal.     Mouth/Throat:     Mouth: Mucous membranes are moist.     Pharynx: Oropharynx is clear. No oropharyngeal exudate or posterior oropharyngeal erythema.  Eyes:     General: No scleral icterus.       Right eye: No discharge.        Left eye: No discharge.     Extraocular Movements: Extraocular movements intact.      Conjunctiva/sclera: Conjunctivae normal.     Pupils: Pupils are equal, round, and reactive to light.  Cardiovascular:     Rate and Rhythm: Normal rate and regular rhythm.  Pulmonary:     Effort: Pulmonary effort is normal. No respiratory distress.     Breath sounds: Normal breath sounds.  Abdominal:     General: Bowel sounds are normal.  Musculoskeletal:     Cervical back: No rigidity or tenderness.  Skin:    General: Skin is warm and dry.  Neurological:     Mental Status: He is alert and oriented to person, place, and time.  Psychiatric:        Mood and Affect: Mood normal.        Behavior: Behavior normal.      Results for orders placed or performed in visit on 02/22/24  Basic metabolic panel with GFR  Result Value Ref Range   Sodium 143 135 - 145 mEq/L   Potassium 3.3 (L) 3.5 - 5.1 mEq/L   Chloride 101 96 - 112 mEq/L   CO2 30 19 - 32 mEq/L   Glucose, Bld 92 70 - 99 mg/dL   BUN 22 6 - 23 mg/dL   Creatinine, Ser 9.01 0.40 - 1.50 mg/dL   GFR 28.98 >39.99 mL/min   Calcium  9.0 8.4 - 10.5 mg/dL  Lipid panel  Result Value Ref Range   Cholesterol 140 0 - 200 mg/dL   Triglycerides 823.9 (H) 0.0 - 149.0 mg/dL   HDL 59.39 >60.99 mg/dL   VLDL 64.7 0.0 - 59.9 mg/dL   LDL Cholesterol 64 0 - 99 mg/dL   Total CHOL/HDL Ratio 3    NonHDL 99.26   Hemoglobin A1c  Result Value Ref Range   Hgb A1c MFr Bld 5.4 4.6 - 6.5 %  Urinalysis, Routine w reflex microscopic  Result Value Ref Range   Color, Urine YELLOW Yellow;Lt. Yellow;Straw;Dark Yellow;Amber;Green;Red;Brown   APPearance CLEAR Clear;Turbid;Slightly Cloudy;Cloudy   Specific Gravity, Urine 1.025 1.000 - 1.030   pH 6.0 5.0 - 8.0   Total Protein, Urine TRACE (A) Negative   Urine Glucose NEGATIVE Negative   Ketones, ur TRACE (A) Negative   Bilirubin Urine NEGATIVE Negative   Hgb urine dipstick NEGATIVE Negative   Urobilinogen, UA 1.0 0.0 - 1.0   Leukocytes,Ua NEGATIVE Negative   Nitrite NEGATIVE Negative   WBC, UA none  seen 0-2/hpf   RBC / HPF none seen 0-2/hpf   Mucus, UA Presence of (A) None   Squamous Epithelial / HPF Rare(0-4/hpf) Rare(0-4/hpf)  CBC  Result Value Ref Range   WBC 5.6 4.0 - 10.5 K/uL   RBC 4.07 (L)  4.22 - 5.81 Mil/uL   Platelets 412.0 (H) 150.0 - 400.0 K/uL   Hemoglobin 13.8 13.0 - 17.0 g/dL   HCT 60.8 60.9 - 47.9 %   MCV 96.3 78.0 - 100.0 fl   MCHC 35.2 30.0 - 36.0 g/dL   RDW 82.9 (H) 88.4 - 84.4 %  TSH  Result Value Ref Range   TSH 3.74 0.35 - 5.50 uIU/mL  Vitamin B12  Result Value Ref Range   Vitamin B-12 306 211 - 911 pg/mL      The ASCVD Risk score (Arnett DK, et al., 2019) failed to calculate for the following reasons:   The 2019 ASCVD risk score is only valid for ages 31 to 31    Assessment & Plan:   Essential hypertension -     Basic metabolic panel with GFR -     Urinalysis, Routine w reflex microscopic -     CBC -     hydroCHLOROthiazide ; Take 1 tablet (25 mg total) by mouth daily.  Dispense: 90 tablet; Refill: 3  Hyperlipidemia, unspecified hyperlipidemia type -     Lipid panel  Orthostatic dizziness  Screening for diabetes mellitus -     Basic metabolic panel with GFR -     Hemoglobin A1c  Acquired hypothyroidism -     TSH  Current use of proton pump inhibitor -     Vitamin B12  Hypokalemia -     Potassium Chloride  Crys ER; Take 1 tablet (20 mEq total) by mouth daily.  Dispense: 30 tablet; Refill: 3    Return in about 3 months (around 05/22/2024), or Occasionally please check and record blood pressures with your arm cuff..  Patient did not tilt today with orthostatic checks.  Will increase HCTZ to 25 mg daily.   Elsie Sim Lent, MD

## 2024-02-23 ENCOUNTER — Ambulatory Visit: Payer: Self-pay | Admitting: Family Medicine

## 2024-02-23 MED ORDER — POTASSIUM CHLORIDE CRYS ER 20 MEQ PO TBCR: 20.0000 meq | EXTENDED_RELEASE_TABLET | Freq: Every day | ORAL | 3 refills | Status: AC

## 2024-02-23 NOTE — Addendum Note (Signed)
 Addended by: BERNETA ELSIE LABOR on: 02/23/2024 07:48 AM   Modules accepted: Orders

## 2024-02-23 NOTE — Telephone Encounter (Signed)
Patient has been notified directly; all questions, if any, were answered. Patient voiced understanding.   

## 2024-04-03 ENCOUNTER — Other Ambulatory Visit: Payer: Self-pay | Admitting: Family Medicine

## 2024-04-03 DIAGNOSIS — I10 Essential (primary) hypertension: Secondary | ICD-10-CM

## 2024-05-02 ENCOUNTER — Inpatient Hospital Stay: Payer: Medicare Other

## 2024-05-02 ENCOUNTER — Ambulatory Visit: Payer: Medicare Other | Admitting: Medical Oncology

## 2024-05-23 ENCOUNTER — Ambulatory Visit: Admitting: Family Medicine

## 2024-07-22 ENCOUNTER — Ambulatory Visit
